# Patient Record
Sex: Female | Born: 1935 | Race: Black or African American | Hispanic: No | Smoking: Former smoker
Health system: Southern US, Community
[De-identification: ages and names within clinical notes are randomized; demographics above are authoritative.]

## PROBLEM LIST (undated history)

## (undated) ENCOUNTER — Emergency Department (HOSPITAL_COMMUNITY): Discharge status: Absent without leave | Payer: Commercial Managed Care - HMO | Source: Home / Self Care

## (undated) ENCOUNTER — Emergency Department (HOSPITAL_COMMUNITY): Payer: Commercial Managed Care - HMO | Source: Home / Self Care

## (undated) ENCOUNTER — Emergency Department (HOSPITAL_COMMUNITY): Admission: EM | Discharge status: Absent without leave | Payer: Self-pay

## (undated) DIAGNOSIS — I252 Old myocardial infarction: Secondary | ICD-10-CM

## (undated) DIAGNOSIS — I2510 Atherosclerotic heart disease of native coronary artery without angina pectoris: Secondary | ICD-10-CM

## (undated) DIAGNOSIS — I739 Peripheral vascular disease, unspecified: Principal | ICD-10-CM

## (undated) DIAGNOSIS — I779 Disorder of arteries and arterioles, unspecified: Secondary | ICD-10-CM

## (undated) DIAGNOSIS — I358 Other nonrheumatic aortic valve disorders: Secondary | ICD-10-CM

## (undated) DIAGNOSIS — I10 Essential (primary) hypertension: Secondary | ICD-10-CM

## (undated) DIAGNOSIS — I219 Acute myocardial infarction, unspecified: Secondary | ICD-10-CM

## (undated) DIAGNOSIS — E785 Hyperlipidemia, unspecified: Secondary | ICD-10-CM

## (undated) DIAGNOSIS — H409 Unspecified glaucoma: Secondary | ICD-10-CM

## (undated) DIAGNOSIS — K449 Diaphragmatic hernia without obstruction or gangrene: Secondary | ICD-10-CM

## (undated) HISTORY — DX: Peripheral vascular disease, unspecified: I73.9

## (undated) HISTORY — DX: Old myocardial infarction: I25.2

## (undated) HISTORY — DX: Disorder of arteries and arterioles, unspecified: I77.9

## (undated) HISTORY — DX: Hyperlipidemia, unspecified: E78.5

## (undated) HISTORY — DX: Atherosclerotic heart disease of native coronary artery without angina pectoris: I25.10

## (undated) HISTORY — PX: HIATAL HERNIA REPAIR: SHX195

## (undated) HISTORY — DX: Essential (primary) hypertension: I10

## (undated) HISTORY — DX: Unspecified glaucoma: H40.9

## (undated) HISTORY — PX: OTHER SURGICAL HISTORY: SHX169

## (undated) HISTORY — DX: Other nonrheumatic aortic valve disorders: I35.8

## (undated) HISTORY — PX: APPENDECTOMY: SHX54

## (undated) HISTORY — DX: Acute myocardial infarction, unspecified: I21.9

---

## 1998-04-18 ENCOUNTER — Ambulatory Visit (HOSPITAL_COMMUNITY): Admission: RE | Admit: 1998-04-18 | Discharge: 1998-04-18 | Payer: Self-pay | Admitting: Cardiology

## 2002-10-18 DIAGNOSIS — I358 Other nonrheumatic aortic valve disorders: Secondary | ICD-10-CM

## 2002-10-18 HISTORY — DX: Other nonrheumatic aortic valve disorders: I35.8

## 2002-10-18 HISTORY — PX: TRANSTHORACIC ECHOCARDIOGRAM: SHX275

## 2005-07-09 ENCOUNTER — Ambulatory Visit (HOSPITAL_COMMUNITY): Admission: RE | Admit: 2005-07-09 | Discharge: 2005-07-09 | Payer: Self-pay | Admitting: Gastroenterology

## 2011-11-16 ENCOUNTER — Other Ambulatory Visit: Payer: Self-pay | Admitting: Otolaryngology

## 2011-11-24 ENCOUNTER — Ambulatory Visit
Admission: RE | Admit: 2011-11-24 | Discharge: 2011-11-24 | Disposition: A | Discharge status: Home / Self Care | Payer: Medicare PPO | Source: Ambulatory Visit | Attending: Otolaryngology | Admitting: Otolaryngology

## 2011-11-24 MED ORDER — GADOBENATE DIMEGLUMINE 529 MG/ML IV SOLN
6.0000 mL | Freq: Once | INTRAVENOUS | Status: AC | PRN
  Administered 2011-11-24: 6 mL via INTRAVENOUS

## 2013-04-11 ENCOUNTER — Encounter: Payer: Self-pay | Admitting: Cardiology

## 2013-04-11 ENCOUNTER — Ambulatory Visit (INDEPENDENT_AMBULATORY_CARE_PROVIDER_SITE_OTHER): Payer: Medicare PPO | Admitting: Cardiology

## 2013-04-11 ENCOUNTER — Other Ambulatory Visit: Payer: Self-pay | Admitting: *Deleted

## 2013-04-11 VITALS — BP 168/62 | HR 69 | Ht 63.5 in | Wt 123.5 lb

## 2013-04-11 DIAGNOSIS — I359 Nonrheumatic aortic valve disorder, unspecified: Secondary | ICD-10-CM

## 2013-04-11 DIAGNOSIS — I779 Disorder of arteries and arterioles, unspecified: Secondary | ICD-10-CM

## 2013-04-11 DIAGNOSIS — E785 Hyperlipidemia, unspecified: Secondary | ICD-10-CM

## 2013-04-11 DIAGNOSIS — I358 Other nonrheumatic aortic valve disorders: Secondary | ICD-10-CM | POA: Insufficient documentation

## 2013-04-11 DIAGNOSIS — I10 Essential (primary) hypertension: Secondary | ICD-10-CM

## 2013-04-11 MED ORDER — LOVASTATIN 20 MG PO TABS: 20.0000 mg | ORAL_TABLET | Freq: Every day | ORAL | Status: DC

## 2013-04-11 MED ORDER — AMLODIPINE BESYLATE 10 MG PO TABS: 10.0000 mg | ORAL_TABLET | Freq: Every day | ORAL | Status: DC

## 2013-04-11 MED ORDER — CHLORTHALIDONE 25 MG PO TABS: 25.0000 mg | ORAL_TABLET | Freq: Every day | ORAL | Status: DC

## 2013-04-11 NOTE — Patient Instructions (Signed)
Your physician recommends that you continue on your current medications as directed. Please refer to the Current Medication list given to you today.  Only take Lovastatin once a day.   Your physician wants you to follow-up in 12 months with Dr Herbie Baltimore. You will receive a reminder letter in the mail two months in advance. If you don't receive a letter, please call our office to schedule the follow-up appointment.

## 2013-04-11 NOTE — Progress Notes (Signed)
Patient ID: Sheila Murphy, female   DOB: 09-30-1922, 79 y.o.   MRN: [id number]  Clinic Note: HPI: Sheila Murphy is a 79 y.o. female with a PMH below who presents today for routine followup of her carotid disease and hypertension. Interval History: she is a very pleasant 79 year old woman with a history of reportedly an MI back in the 1960s, as well as hypertension and dyslipidemia with bilateral carotid disease who is doing wonderfully from a cardiac standpoint. She is not symptomatic at all, no chest pain or shortness of breath with rest or exertion. No PND, orthopnea or edema. No lightheadedness, dizziness, wooziness, syncope or near syncope. No palpitations. No melena,  hematochezia or hematuria.  She denies any TIA or amaurosis fugax symptoms. She also denies any claudication symptoms.  Past Medical History  Diagnosis Date  . HTN (hypertension)   . Dyslipidemia     On Mevacor - followed by PCP  . Carotid artery disease without cerebral infarction     Right internal carotid: 70-90%, left 60-70%; asymptomatic, not and should follow.  . Aortic valve sclerosis 2004    Echo showed normal LV function, some mild diastolic dysfunction. Aortic sclerosis but no stenosis.    History reviewed. No pertinent past surgical history.   Allergies  Allergen Reactions  . Penicillins     Current Outpatient Prescriptions  Medication Sig Dispense Refill  . amLODipine (NORVASC) 10 MG tablet Take 10 mg by mouth daily.      . chlorthalidone (HYGROTON) 25 MG tablet Take 25 mg by mouth daily.      Marland Kitchen lovastatin (MEVACOR) 20 MG tablet Take 20 mg by mouth 2 (two) times daily.      . Multiple Vitamin (MULTIVITAMIN) tablet Take 1 tablet by mouth daily.      . naproxen sodium (ALEVE) 220 MG tablet Take 220 mg by mouth. 2 tablets in morning      . vitamin E 400 UNIT capsule Take 400 Units by mouth daily.       No current facility-administered medications for this visit.    History   Social History  .  Marital Status: Widowed    Spouse Name: N/A    Number of Children: N/A  . Years of Education: N/A   Occupational History  . Not on file.   Social History Main Topics  . Smoking status: Never Smoker   . Smokeless tobacco: Not on file  . Alcohol Use: No  . Drug Use: No  . Sexually Active: Not on file   Other Topics Concern  . Not on file   Social History Narrative   Widowed mother of 5 with one deceased child. She has 3 of her children the spend significant time with her 2 living in her home. 23 grandchildren 18 great grandchildren. 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 As of now the stairs of alcohol house without any problems. 2 sons that are her caregivers providing her meals and transportation.    ROS: A comprehensive Review of Systems - Negative except Mild arthritic pains, and just generally slowing down some. She is occasional leg cramping  PHYSICAL EXAM BP 168/62  Pulse 69  Ht 5' 3.5" (1.613 m)  Wt 123 lb 8 oz (56.019 kg)  BMI 21.53 kg/m2 General: She is a very pleasant, elderly woman. She is in no acute distress. Alert and oriented x3, answers questions appropriately. She is 5 feet, 4 inches, 125 pounds; stable sincelast visit,  and she says she is eating healthy. HEENT: Normocephalic, atraumatic. Extraocular muscles intact with the exception of some arcus senilis.  Neck is supple, no lymphadenopathy, thyromegaly or JVD. She has bilateral carotid bruits, left greater than the right, but normal carotid pulsation.  Lungs: Clear to auscultation bilaterally, normal respiratory effort, good air movement. Nonlabored.  Heart: Regular rate and rhythm, normal S1, S2 with a 1-2/6 SEM at heard at the right upper sternal border radiating to the carotids. Soft 1-2/6 HSM at the apex, nondisplaced PMI, no heaves or thrills.  Abdomen is soft, nontender, nondistended. Normoactive bowel sounds, no hepatosplenomegaly. Extremities: No  clubbing, cyanosis, or edema, 2+ and equal pulses throughout.  AVW:UJWJXBJYN today: Yes Rate: 69 , Rhythm: Normal sinus; normal ECG  Recent Labs:  Due to have checked in early July by PCP.  ASSESSMENT:  Very pleasant 79 year old woman, essentially stable from cardiac standpoint with no ongoing symptoms. She continues to decline any further evaluation of her carotid disease and her aortic valve disease.  Carotid artery disease without cerebral infarction  HTN (hypertension)  Dyslipidemia  Aortic valve sclerosis  PLAN: Per problem list. No orders of the defined types were placed in this encounter.   Meds refilled this encounter  Medications  . chlorthalidone (HYGROTON) 25 MG tablet    Sig: Take 25 mg by mouth daily.  Marland Kitchen lovastatin (MEVACOR) 20 MG tablet    Sig: Take 20 mg by mouth 2 (two) times daily.  Marland Kitchen amLODipine (NORVASC) 10 MG tablet    Sig: Take 10 mg by mouth daily.    Followup: 12 months  Sheila Murphy W, M.D., M.S. [facility] [address] [place], [place]  [address]  [phone] Pager # [phone] 04/11/2013 10:09 AM

## 2013-04-11 NOTE — Assessment & Plan Note (Signed)
I am dosing so she'll not going to be Mevacor once daily. She is due to have labs checked by her primary in July. Hopefully backing off the Mevacor may help summer cramps.

## 2013-04-11 NOTE — Assessment & Plan Note (Signed)
Well-controlled on current medications. She needs close monitoring of her chemistry panel as per her primary physician. I will then refill her medications medications today.

## 2013-04-11 NOTE — Assessment & Plan Note (Signed)
No ongoing symptoms. She defers any further evaluation unless she has symptoms.

## 2013-04-11 NOTE — Assessment & Plan Note (Signed)
No evidence of stenosis by her echocardiogram he is go. Does not sound be significantly worse than last year., She has symptoms concerning for air stenosis, she would prefer to not have any other evaluations done.

## 2014-02-04 ENCOUNTER — Other Ambulatory Visit (HOSPITAL_COMMUNITY): Payer: Self-pay | Admitting: Internal Medicine

## 2014-02-04 DIAGNOSIS — I6529 Occlusion and stenosis of unspecified carotid artery: Secondary | ICD-10-CM

## 2014-02-14 ENCOUNTER — Ambulatory Visit (HOSPITAL_COMMUNITY)
Admission: RE | Admit: 2014-02-14 | Discharge: 2014-02-14 | Disposition: A | Discharge status: Home / Self Care | Payer: Medicare PPO | Source: Ambulatory Visit | Attending: Cardiovascular Disease | Admitting: Cardiovascular Disease

## 2014-02-14 DIAGNOSIS — I779 Disorder of arteries and arterioles, unspecified: Secondary | ICD-10-CM

## 2014-02-14 DIAGNOSIS — I6529 Occlusion and stenosis of unspecified carotid artery: Secondary | ICD-10-CM | POA: Insufficient documentation

## 2014-02-14 NOTE — Progress Notes (Addendum)
Carotid Duplex Completed.Right ICA showed a known 70-99% stenosis. Marilynne Halstedita Cait Locust, BS, RDMS, RVT

## 2014-02-15 ENCOUNTER — Telehealth (HOSPITAL_COMMUNITY): Payer: Self-pay | Admitting: *Deleted

## 2014-03-29 ENCOUNTER — Other Ambulatory Visit: Payer: Self-pay | Admitting: *Deleted

## 2014-03-29 MED ORDER — LOVASTATIN 20 MG PO TABS: 20.0000 mg | ORAL_TABLET | Freq: Every day | ORAL | Status: DC

## 2014-03-29 NOTE — Telephone Encounter (Signed)
Rx refill sent to patient pharmacy   

## 2014-04-09 ENCOUNTER — Encounter: Payer: Self-pay | Admitting: *Deleted

## 2014-04-09 ENCOUNTER — Encounter: Payer: Self-pay | Admitting: Cardiology

## 2014-04-10 ENCOUNTER — Ambulatory Visit (INDEPENDENT_AMBULATORY_CARE_PROVIDER_SITE_OTHER): Payer: Medicare PPO | Admitting: Cardiology

## 2014-04-10 VITALS — BP 138/54 | HR 71 | Ht 60.0 in | Wt 123.7 lb

## 2014-04-10 DIAGNOSIS — I359 Nonrheumatic aortic valve disorder, unspecified: Secondary | ICD-10-CM

## 2014-04-10 DIAGNOSIS — I739 Peripheral vascular disease, unspecified: Principal | ICD-10-CM

## 2014-04-10 DIAGNOSIS — I779 Disorder of arteries and arterioles, unspecified: Secondary | ICD-10-CM

## 2014-04-10 DIAGNOSIS — I358 Other nonrheumatic aortic valve disorders: Secondary | ICD-10-CM

## 2014-04-10 DIAGNOSIS — I10 Essential (primary) hypertension: Secondary | ICD-10-CM

## 2014-04-10 DIAGNOSIS — E785 Hyperlipidemia, unspecified: Secondary | ICD-10-CM

## 2014-04-10 NOTE — Patient Instructions (Signed)
Continue with current  Medication.  Your physician wants you to follow-up in 12 months Dr Herbie BaltimoreHarding.  You will receive a reminder letter in the mail two months in advance. If you don't receive a letter, please call our office to schedule the follow-up appointment.

## 2014-04-14 ENCOUNTER — Encounter: Payer: Self-pay | Admitting: Cardiology

## 2014-04-14 NOTE — Assessment & Plan Note (Signed)
I was not aware that she had follow-up carotid dopplers.  These did show progression of disease, but she remains asymptomatic.  Based upon our previous discussions, she would defer treatment in the absence of symptoms.   Unfortunately, the results were not forwarded to me & I am not sure if she is aware of the results.   We will contact her to make sure that she is aware of the findings & confirm or deny her desire to forgo further evaluation.

## 2014-04-14 NOTE — Progress Notes (Signed)
Patient ID: Sheila Murphy, female   DOB: Nov 08, 1921, 80 y.o.   MRN: [id number]  Clinic Note: HPI: Sheila Murphy is a 80 y.o. female with a PMH below who presents today for routine followup of her carotid disease and hypertension.  Her last carotid ultrasound was in April of 2015 -- RproICA 70-99%, LICA 50-69% -- represents moderate progression of disease in RICA.  Interval History: She is a very pleasant 80 year old woman with a history of reportedly an MI back in the 1960s, as well as hypertension and dyslipidemia with bilateral carotid disease who is doing wonderfully from a cardiac standpoint. She is not symptomatic at all, no chest pain or shortness of breath with rest or exertion. No PND, orthopnea or edema. No lightheadedness, dizziness, wooziness, syncope or near syncope. No palpitations. No melena,  hematochezia or hematuria.  She denies any TIA or amaurosis fugax symptoms. She also denies any claudication symptoms.  She has 3 sons that live with her, and states that she mostly walks around inside the house unless one of them is at home because she is scared to go out of the house alone.  She walks slowly due to her arthritis and slightly unsteady gait, but does not get SOB or note CP.    Past Medical History  Diagnosis Date  . HTN (hypertension)   . Dyslipidemia     On Mevacor - followed by PCP  . Carotid artery disease without cerebral infarction     Right internal carotid: 70-90%, left 60-70%; asymptomatic --> moderately increased RICA velocities on 01/2014 dopplers.  . Aortic valve sclerosis 2004    Echo showed normal LV function, some mild diastolic dysfunction. Aortic sclerosis but no stenosis.  . Glaucoma   . History of heart attack 1960s    No WMA on Echo    Past Cardiac Evaluatoin / Procedure History  Procedure Laterality Date  . Transthoracic echocardiogram  2004    LV EF normal, LA mildly dilated, arial septum is aneurysmal, mild aortic sclerosis, mild mitral annular  calcification, mild MR, mild TR, RVSP 40-2050mmHg (mod pulm HTN)  . Carotid doppler  2011; 2015    RICA 70-99% diameter reduction, LICA 50-69% diameter reduction, right RCA significantly elevated velocities;; moderately increased when compared to 2011.     Allergies  Allergen Reactions  . Penicillins     Current Outpatient Prescriptions  Medication Sig Dispense Refill  . alendronate (FOSAMAX) 70 MG tablet Take 70 mg by mouth once a week.      Marland Kitchen. amLODipine (NORVASC) 10 MG tablet Take 1 tablet (10 mg total) by mouth daily.  30 tablet  11  . aspirin 81 MG EC tablet Take 81 mg by mouth daily.      Marland Kitchen. CALCIUM PO Take by mouth.      . chlorthalidone (HYGROTON) 25 MG tablet Take 1 tablet (25 mg total) by mouth daily.  30 tablet  11  . cholecalciferol (VITAMIN D) 1000 UNITS tablet Take 1,000 Units by mouth daily.      Marland Kitchen. lovastatin (MEVACOR) 20 MG tablet Take 1 tablet (20 mg total) by mouth at bedtime.  30 tablet  0  . Multiple Vitamin (MULTIVITAMIN) tablet Take 1 tablet by mouth daily.      . naproxen sodium (ALEVE) 220 MG tablet Take 220 mg by mouth. 2 tablets in morning      . vitamin E 400 UNIT capsule Take 400 Units by mouth daily.       No current facility-administered  medications for this visit.   No Change to Social & Family History - [place].   ROS: A comprehensive Review of Systems - Negative except Mild arthritic pains, and just generally slowing down some. She is occasional leg cramping & knee/hip OA pains.  She is a bit more anxious & fearful of going places.  PHYSICAL EXAM BP 138/54  Pulse 71  Ht 5' (1.524 m)  Wt 123 lb 11.2 oz (56.11 kg)  BMI 24.16 kg/m2 General: She is a very pleasant, elderly woman. NAD, A&O x3 with clear memory.  Answers ?s appropriately. She appears to be well nourished & says she is eating healthy. HEENT: Normocephalic, atraumatic. EOMI, with the exception of some arcus senilis.  Neck is supple, no lymphadenopathy, thyromegaly or JVD. She has  bilateral carotid bruits, R >L, but normal carotid pulsation.  Lungs: CTAB, normal respiratory effort,good air movement. No W/R/R.  Heart: RRR, normal S1, S2 with a 2/6 SEM at heard at the right upper sternal border radiating to the carotids. Soft 1-2/6 HSM at the apex, nondisplaced PMI, no heaves or thrills.  Abdomen is soft, nontender, nondistended. Normoactive bowel sounds, no hepatosplenomegaly. Extremities: No clubbing, cyanosis, or edema, 2+ and equal pulses throughout. Neuro: Grossly intact.  WJX:[id number]:Performed today: Yes Rate: 6719 , Rhythm: Normal sinus; non-specific ST-T wave changes.  Recent Labs:  None available..  ASSESSMENT/PLAN:  Very pleasant 80 year old woman, essentially stable from cardiac standpoint with no ongoing symptoms. She continues to decline any further evaluation of her carotid disease and her aortic valve disease -- however came in for f/u dopplers.  She has not seemed inclined to pursue invasive treatment options.  Carotid artery disease without cerebral infarction I was not aware that she had follow-up carotid dopplers.  These did show progression of disease, but she remains asymptomatic.  Based upon our previous discussions, she would defer treatment in the absence of symptoms.   Unfortunately, the results were not forwarded to me & I am not sure if she is aware of the results.   We will contact her to make sure that she is aware of the findings & confirm or deny her desire to forgo further evaluation.   Aortic valve sclerosis She has not had a repeat Echo since 2004, and probably does not need one unless she begins to have symptoms of AS.  Mild Sclerosis at age ~79-81 is not likely to progress rapidly to any significant level of AS.  Essential hypertension Well controlled.  Continue current medications.   Would not be more aggressive in order to avoid orthostatic hypotension.  Need to carefully monitor labs.   Dyslipidemia Back on lower dose of lovastatin --  labs are supposedly monitored by her PCP    Orders Placed This Encounter  Procedures  . EKG 12-Lead     Followup: 12 months; will need telephonic discussion to ensure that she knows the results of her Carotid Dopplers.   Marykay LexHARDING,DAVID W, M.D., M.S. Interventional Cardiologist   Pager # [phone] 04/14/2014

## 2014-04-14 NOTE — Assessment & Plan Note (Signed)
Well controlled.  Continue current medications.   Would not be more aggressive in order to avoid orthostatic hypotension.  Need to carefully monitor labs.

## 2014-04-14 NOTE — Assessment & Plan Note (Signed)
Back on lower dose of lovastatin -- labs are supposedly monitored by her PCP

## 2014-04-14 NOTE — Assessment & Plan Note (Signed)
She has not had a repeat Echo since 2004, and probably does not need one unless she begins to have symptoms of AS.  Mild Sclerosis at age ~79-81 is not likely to progress rapidly to any significant level of AS.

## 2014-04-15 ENCOUNTER — Other Ambulatory Visit: Payer: Self-pay | Admitting: Cardiology

## 2014-04-15 NOTE — Telephone Encounter (Signed)
Rx refill sent to patient pharmacy   

## 2014-04-26 ENCOUNTER — Other Ambulatory Visit: Payer: Self-pay | Admitting: Cardiology

## 2014-04-26 NOTE — Telephone Encounter (Signed)
Rx was sent to pharmacy electronically. 

## 2014-08-19 ENCOUNTER — Other Ambulatory Visit: Payer: Self-pay | Admitting: Cardiology

## 2014-08-19 NOTE — Telephone Encounter (Signed)
Rx was sent to pharmacy electronically. 

## 2014-12-04 DIAGNOSIS — E782 Mixed hyperlipidemia: Secondary | ICD-10-CM | POA: Diagnosis not present

## 2014-12-04 DIAGNOSIS — I129 Hypertensive chronic kidney disease with stage 1 through stage 4 chronic kidney disease, or unspecified chronic kidney disease: Secondary | ICD-10-CM | POA: Diagnosis not present

## 2014-12-04 DIAGNOSIS — E039 Hypothyroidism, unspecified: Secondary | ICD-10-CM | POA: Diagnosis not present

## 2014-12-04 DIAGNOSIS — R7309 Other abnormal glucose: Secondary | ICD-10-CM | POA: Diagnosis not present

## 2014-12-04 DIAGNOSIS — N183 Chronic kidney disease, stage 3 (moderate): Secondary | ICD-10-CM | POA: Diagnosis not present

## 2014-12-04 DIAGNOSIS — R634 Abnormal weight loss: Secondary | ICD-10-CM | POA: Diagnosis not present

## 2014-12-05 ENCOUNTER — Inpatient Hospital Stay (HOSPITAL_COMMUNITY)
Admission: EM | Admit: 2014-12-05 | Discharge: 2014-12-16 | DRG: 356 | Disposition: A | Discharge status: Home Health | Payer: Commercial Managed Care - HMO | Attending: Internal Medicine | Admitting: Internal Medicine

## 2014-12-05 ENCOUNTER — Encounter (HOSPITAL_COMMUNITY): Payer: Self-pay | Admitting: General Practice

## 2014-12-05 DIAGNOSIS — I10 Essential (primary) hypertension: Secondary | ICD-10-CM | POA: Diagnosis not present

## 2014-12-05 DIAGNOSIS — Z8249 Family history of ischemic heart disease and other diseases of the circulatory system: Secondary | ICD-10-CM | POA: Diagnosis not present

## 2014-12-05 DIAGNOSIS — E785 Hyperlipidemia, unspecified: Secondary | ICD-10-CM | POA: Diagnosis present

## 2014-12-05 DIAGNOSIS — E872 Acidosis: Secondary | ICD-10-CM | POA: Diagnosis not present

## 2014-12-05 DIAGNOSIS — D696 Thrombocytopenia, unspecified: Secondary | ICD-10-CM | POA: Diagnosis not present

## 2014-12-05 DIAGNOSIS — E039 Hypothyroidism, unspecified: Secondary | ICD-10-CM | POA: Diagnosis not present

## 2014-12-05 DIAGNOSIS — R579 Shock, unspecified: Secondary | ICD-10-CM | POA: Diagnosis not present

## 2014-12-05 DIAGNOSIS — N390 Urinary tract infection, site not specified: Secondary | ICD-10-CM | POA: Diagnosis not present

## 2014-12-05 DIAGNOSIS — K254 Chronic or unspecified gastric ulcer with hemorrhage: Secondary | ICD-10-CM | POA: Diagnosis not present

## 2014-12-05 DIAGNOSIS — I358 Other nonrheumatic aortic valve disorders: Secondary | ICD-10-CM | POA: Diagnosis present

## 2014-12-05 DIAGNOSIS — K922 Gastrointestinal hemorrhage, unspecified: Secondary | ICD-10-CM | POA: Diagnosis not present

## 2014-12-05 DIAGNOSIS — Z79899 Other long term (current) drug therapy: Secondary | ICD-10-CM | POA: Diagnosis not present

## 2014-12-05 DIAGNOSIS — N170 Acute kidney failure with tubular necrosis: Secondary | ICD-10-CM | POA: Diagnosis not present

## 2014-12-05 DIAGNOSIS — R404 Transient alteration of awareness: Secondary | ICD-10-CM | POA: Diagnosis not present

## 2014-12-05 DIAGNOSIS — R578 Other shock: Secondary | ICD-10-CM | POA: Diagnosis not present

## 2014-12-05 DIAGNOSIS — K439 Ventral hernia without obstruction or gangrene: Secondary | ICD-10-CM | POA: Diagnosis present

## 2014-12-05 DIAGNOSIS — Z95828 Presence of other vascular implants and grafts: Secondary | ICD-10-CM

## 2014-12-05 DIAGNOSIS — E876 Hypokalemia: Secondary | ICD-10-CM | POA: Diagnosis not present

## 2014-12-05 DIAGNOSIS — I748 Embolism and thrombosis of other arteries: Secondary | ICD-10-CM | POA: Diagnosis not present

## 2014-12-05 DIAGNOSIS — R8290 Unspecified abnormal findings in urine: Secondary | ICD-10-CM | POA: Diagnosis not present

## 2014-12-05 DIAGNOSIS — I252 Old myocardial infarction: Secondary | ICD-10-CM

## 2014-12-05 DIAGNOSIS — I951 Orthostatic hypotension: Secondary | ICD-10-CM | POA: Diagnosis not present

## 2014-12-05 DIAGNOSIS — Z7982 Long term (current) use of aspirin: Secondary | ICD-10-CM | POA: Diagnosis not present

## 2014-12-05 DIAGNOSIS — S80212A Abrasion, left knee, initial encounter: Secondary | ICD-10-CM | POA: Diagnosis not present

## 2014-12-05 DIAGNOSIS — Z452 Encounter for adjustment and management of vascular access device: Secondary | ICD-10-CM | POA: Diagnosis not present

## 2014-12-05 DIAGNOSIS — D62 Acute posthemorrhagic anemia: Secondary | ICD-10-CM | POA: Diagnosis present

## 2014-12-05 DIAGNOSIS — K264 Chronic or unspecified duodenal ulcer with hemorrhage: Principal | ICD-10-CM | POA: Diagnosis present

## 2014-12-05 DIAGNOSIS — I2510 Atherosclerotic heart disease of native coronary artery without angina pectoris: Secondary | ICD-10-CM | POA: Diagnosis not present

## 2014-12-05 DIAGNOSIS — K274 Chronic or unspecified peptic ulcer, site unspecified, with hemorrhage: Secondary | ICD-10-CM | POA: Diagnosis not present

## 2014-12-05 DIAGNOSIS — I878 Other specified disorders of veins: Secondary | ICD-10-CM

## 2014-12-05 DIAGNOSIS — K921 Melena: Secondary | ICD-10-CM | POA: Diagnosis not present

## 2014-12-05 DIAGNOSIS — Z880 Allergy status to penicillin: Secondary | ICD-10-CM

## 2014-12-05 DIAGNOSIS — B9620 Unspecified Escherichia coli [E. coli] as the cause of diseases classified elsewhere: Secondary | ICD-10-CM | POA: Diagnosis present

## 2014-12-05 DIAGNOSIS — R933 Abnormal findings on diagnostic imaging of other parts of digestive tract: Secondary | ICD-10-CM | POA: Diagnosis not present

## 2014-12-05 DIAGNOSIS — D649 Anemia, unspecified: Secondary | ICD-10-CM | POA: Diagnosis not present

## 2014-12-05 DIAGNOSIS — G4720 Circadian rhythm sleep disorder, unspecified type: Secondary | ICD-10-CM | POA: Diagnosis not present

## 2014-12-05 DIAGNOSIS — K265 Chronic or unspecified duodenal ulcer with perforation: Secondary | ICD-10-CM | POA: Diagnosis not present

## 2014-12-05 DIAGNOSIS — K2950 Unspecified chronic gastritis without bleeding: Secondary | ICD-10-CM | POA: Diagnosis not present

## 2014-12-05 DIAGNOSIS — H409 Unspecified glaucoma: Secondary | ICD-10-CM | POA: Diagnosis present

## 2014-12-05 DIAGNOSIS — E43 Unspecified severe protein-calorie malnutrition: Secondary | ICD-10-CM | POA: Diagnosis not present

## 2014-12-05 DIAGNOSIS — J90 Pleural effusion, not elsewhere classified: Secondary | ICD-10-CM | POA: Diagnosis not present

## 2014-12-05 DIAGNOSIS — K260 Acute duodenal ulcer with hemorrhage: Secondary | ICD-10-CM | POA: Diagnosis not present

## 2014-12-05 DIAGNOSIS — R531 Weakness: Secondary | ICD-10-CM | POA: Diagnosis not present

## 2014-12-05 HISTORY — DX: Diaphragmatic hernia without obstruction or gangrene: K44.9

## 2014-12-05 LAB — COMPREHENSIVE METABOLIC PANEL
ALK PHOS: 43 U/L (ref 39–117)
ALT: 12 U/L (ref 0–35)
AST: 30 U/L (ref 0–37)
Albumin: 2.7 g/dL — ABNORMAL LOW (ref 3.5–5.2)
Anion gap: 14 (ref 5–15)
BILIRUBIN TOTAL: 0.9 mg/dL (ref 0.3–1.2)
BUN: 48 mg/dL — ABNORMAL HIGH (ref 6–23)
CO2: 24 mmol/L (ref 19–32)
Calcium: 9.8 mg/dL (ref 8.4–10.5)
Chloride: 100 mmol/L (ref 96–112)
Creatinine, Ser: 1.99 mg/dL — ABNORMAL HIGH (ref 0.50–1.10)
GFR calc Af Amer: 24 mL/min — ABNORMAL LOW (ref >90)
GFR calc non Af Amer: 21 mL/min — ABNORMAL LOW (ref >90)
GLUCOSE: 176 mg/dL — AB (ref 70–99)
Potassium: 3.3 mmol/L — ABNORMAL LOW (ref 3.5–5.1)
SODIUM: 138 mmol/L (ref 135–145)
Total Protein: 5.3 g/dL — ABNORMAL LOW (ref 6.0–8.3)

## 2014-12-05 LAB — URINALYSIS, ROUTINE W REFLEX MICROSCOPIC
Bilirubin Urine: NEGATIVE
GLUCOSE, UA: NEGATIVE mg/dL
Hgb urine dipstick: NEGATIVE
Ketones, ur: NEGATIVE mg/dL
NITRITE: NEGATIVE
PH: 5 (ref 5.0–8.0)
Protein, ur: 30 mg/dL — AB
SPECIFIC GRAVITY, URINE: 1.019 (ref 1.005–1.030)
Urobilinogen, UA: 1 mg/dL (ref 0.0–1.0)

## 2014-12-05 LAB — RETICULOCYTES
RBC.: 3.47 MIL/uL — AB (ref 3.87–5.11)
RETIC COUNT ABSOLUTE: 62.5 10*3/uL (ref 19.0–186.0)
RETIC CT PCT: 1.8 % (ref 0.4–3.1)

## 2014-12-05 LAB — CBC WITH DIFFERENTIAL/PLATELET
Basophils Absolute: 0 10*3/uL (ref 0.0–0.1)
Basophils Relative: 0 % (ref 0–1)
EOS ABS: 0 10*3/uL (ref 0.0–0.7)
Eosinophils Relative: 0 % (ref 0–5)
HEMATOCRIT: 19.7 % — AB (ref 36.0–46.0)
Hemoglobin: 6.7 g/dL — CL (ref 12.0–15.0)
LYMPHS ABS: 0.5 10*3/uL — AB (ref 0.7–4.0)
Lymphocytes Relative: 3 % — ABNORMAL LOW (ref 12–46)
MCH: 30.2 pg (ref 26.0–34.0)
MCHC: 34 g/dL (ref 30.0–36.0)
MCV: 88.7 fL (ref 78.0–100.0)
MONO ABS: 0.5 10*3/uL (ref 0.1–1.0)
Monocytes Relative: 4 % (ref 3–12)
NEUTROS PCT: 93 % — AB (ref 43–77)
Neutro Abs: 13.1 10*3/uL — ABNORMAL HIGH (ref 1.7–7.7)
Platelets: 242 10*3/uL (ref 150–400)
RBC: 2.22 MIL/uL — ABNORMAL LOW (ref 3.87–5.11)
RDW: 13.6 % (ref 11.5–15.5)
WBC: 14.1 10*3/uL — AB (ref 4.0–10.5)

## 2014-12-05 LAB — CBC
HEMATOCRIT: 29.9 % — AB (ref 36.0–46.0)
HEMOGLOBIN: 10.5 g/dL — AB (ref 12.0–15.0)
MCH: 30.3 pg (ref 26.0–34.0)
MCHC: 35.1 g/dL (ref 30.0–36.0)
MCV: 86.2 fL (ref 78.0–100.0)
PLATELETS: 177 10*3/uL (ref 150–400)
RBC: 3.47 MIL/uL — ABNORMAL LOW (ref 3.87–5.11)
RDW: 13.5 % (ref 11.5–15.5)
WBC: 19.3 10*3/uL — ABNORMAL HIGH (ref 4.0–10.5)

## 2014-12-05 LAB — ABO/RH: ABO/RH(D): O POS

## 2014-12-05 LAB — URINE MICROSCOPIC-ADD ON

## 2014-12-05 LAB — MRSA PCR SCREENING: MRSA by PCR: NEGATIVE

## 2014-12-05 LAB — PREPARE RBC (CROSSMATCH)

## 2014-12-05 LAB — POC OCCULT BLOOD, ED: FECAL OCCULT BLD: POSITIVE — AB

## 2014-12-05 MED ORDER — SODIUM CHLORIDE 0.9 % IJ SOLN
3.0000 mL | Freq: Two times a day (BID) | INTRAMUSCULAR | Status: DC
  Administered 2014-12-06 – 2014-12-14 (×8): 3 mL via INTRAVENOUS

## 2014-12-05 MED ORDER — SODIUM CHLORIDE 0.9 % IV SOLN
INTRAVENOUS | Status: DC
  Administered 2014-12-05: 75 mL/h via INTRAVENOUS

## 2014-12-05 MED ORDER — SODIUM CHLORIDE 0.9 % IV SOLN
8.0000 mg/h | INTRAVENOUS | Status: DC
  Administered 2014-12-05 – 2014-12-07 (×4): 8 mg/h via INTRAVENOUS
  Filled 2014-12-05 (×8): qty 80

## 2014-12-05 MED ORDER — SODIUM CHLORIDE 0.9 % IV SOLN
INTRAVENOUS | Status: DC
  Administered 2014-12-05 – 2014-12-09 (×3): via INTRAVENOUS

## 2014-12-05 MED ORDER — POTASSIUM CHLORIDE 10 MEQ/100ML IV SOLN
10.0000 meq | INTRAVENOUS | Status: AC
  Administered 2014-12-05 (×2): 10 meq via INTRAVENOUS
  Filled 2014-12-05 (×3): qty 100

## 2014-12-05 MED ORDER — LEVOTHYROXINE SODIUM 100 MCG IV SOLR
12.5000 ug | Freq: Every day | INTRAVENOUS | Status: DC
  Administered 2014-12-06: 12.5 ug via INTRAVENOUS
  Filled 2014-12-05: qty 5

## 2014-12-05 MED ORDER — SODIUM CHLORIDE 0.9 % IV SOLN
80.0000 mg | Freq: Once | INTRAVENOUS | Status: AC
  Administered 2014-12-05: 80 mg via INTRAVENOUS
  Filled 2014-12-05: qty 80

## 2014-12-05 MED ORDER — PANTOPRAZOLE SODIUM 40 MG IV SOLR: 40.0000 mg | Freq: Two times a day (BID) | INTRAVENOUS | Status: DC

## 2014-12-05 MED ORDER — SODIUM CHLORIDE 0.9 % IV SOLN
Freq: Once | INTRAVENOUS | Status: AC
  Administered 2014-12-05: 12:00:00 via INTRAVENOUS

## 2014-12-05 MED ORDER — MORPHINE SULFATE 2 MG/ML IJ SOLN
1.0000 mg | INTRAMUSCULAR | Status: DC | PRN
  Administered 2014-12-15 (×2): 1 mg via INTRAVENOUS
  Filled 2014-12-05 (×2): qty 1

## 2014-12-05 MED ORDER — POTASSIUM CHLORIDE 10 MEQ/100ML IV SOLN: 10.0000 meq | INTRAVENOUS | Status: DC

## 2014-12-05 MED ORDER — DEXTROSE 5 % IV SOLN
1.0000 g | INTRAVENOUS | Status: DC
  Administered 2014-12-05 – 2014-12-07 (×2): 1 g via INTRAVENOUS
  Filled 2014-12-05 (×4): qty 10

## 2014-12-05 NOTE — H&P (Signed)
[facility] History and Physical                                                                                    Sheila Murphy, is a 81 y.o. female  MRN: [id number]   DOB - Jun 27, 1922  Admit Date - 12/05/2014  Outpatient Primary MD for the patient is Alva Garnet., MD  With History of -  Past Medical History  Diagnosis Date  . HTN (hypertension)   . Dyslipidemia     On Mevacor - followed by PCP  . Carotid artery disease without cerebral infarction     Right internal carotid: 70-90%, left 60-70%; asymptomatic --> moderately increased RICA velocities on 01/2014 dopplers.  . Aortic valve sclerosis 2004    Echo showed normal LV function, some mild diastolic dysfunction. Aortic sclerosis but no stenosis.  . Glaucoma   . History of heart attack 1960s    No WMA on Echo      Past Surgical History  Procedure Laterality Date  . Appendectomy    . Hiatal hernia repair    . Transthoracic echocardiogram  2004    LV EF normal, LA mildly dilated, arial septum is aneurysmal, mild aortic sclerosis, mild mitral annular calcification, mild MR, mild TR, RVSP 40-4mmHg (mod pulm HTN)  . Carotid doppler  2011; 2015    RICA 70-99% diameter reduction, LICA 50-69% diameter reduction, right RCA significantly elevated velocities;; moderately increased when compared to 2011.    in for   Chief Complaint  Patient presents with  . GI Bleeding     HPI [name]  is a 81 y.o. female, with past medical history of hypertension, hypothyroidism, dyslipidemia, known aortic valve sclerosis, asymptomatic right carotid artery disease, and osteoarthritis on regular NSAIDs. She was sent to the emergency room from the nursing home after having 2 bowel movements with a combination of blood and melena. The patient also reported that the night before after eating a sandwich she got nauseous and vomited and noted some red blood in the emesis. She also reported that while ambulating yesterday she became  dizzy and fell, no apparent injuries reported.  After arrival to the ER the patient was noted to be extremely pale in color. Although not hypotensive her blood pressure was quite soft for a patient with reported hypertension with a reading of 115/41. Her MAP was below 65 and her hemoglobin was very low at 6.7. Baseline hemoglobin unknown. Also had mild leukocytosis 14,100 with a left shift of 93%. Potassium was mildly decreased at 3.3 but her BUN was elevated at 48 with a creatinine of 1.99. Again baseline renal function unknown. Her urinalysis was also abnormal and appeared consistent with a likely urinary tract infection. The emergency room physician had ordered 2 units packed red blood cell for transfusion. She was fecal occult blood positive.  Review of Systems   In addition to the HPI above,  No Fever-chills, myalgias or other constitutional symptoms No Headache, changes with Vision or hearing, new weakness, tingling, numbness in any extremity, No problems swallowing food or Liquids, indigestion/reflux No Chest pain, Cough or Shortness of Breath, palpitations, orthopnea or DOE No Abdominal pain, Bowel  movements are regular No dysuria, hematuria or flank pain No new skin rashes, lesions, masses or bruises, No new joints pains-aches-takes Aleve 2 tabs every morning and 1 tab every evening at bedtime No recent weight gain or loss No polyuria, polydypsia or polyphagia,  *A full 10 point Review of Systems was done, except as stated above, all other Review of Systems were negative.  Social History History  Substance Use Topics  . Smoking status: Never Smoker   . Smokeless tobacco: Not on file  . Alcohol Use: No    Family History Family History  Problem Relation Age of Onset  . Other      Noncontributory, deferred due to age  . Heart attack Father     Prior to Admission medications   Medication Sig Start Date End Date Taking? Authorizing Provider  acetaminophen (TYLENOL) 650 MG CR  tablet Take 650 mg by mouth every 8 (eight) hours as needed for pain.   Yes Historical Provider, MD  alendronate (FOSAMAX) 70 MG tablet Take 70 mg by mouth once a week. Wednesday 04/05/14  Yes Historical Provider, MD  amLODipine (NORVASC) 5 MG tablet Take 5 mg by mouth daily.   Yes Historical Provider, MD  aspirin 81 MG EC tablet Take 81 mg by mouth daily. 03/26/14  Yes Historical Provider, MD  CALCIUM PO Take by mouth.   Yes Historical Provider, MD  chlorthalidone (HYGROTON) 25 MG tablet Take 25 mg by mouth daily.   Yes Historical Provider, MD  cholecalciferol (VITAMIN D) 1000 UNITS tablet Take 1,000 Units by mouth daily.   Yes Historical Provider, MD  levothyroxine (SYNTHROID, LEVOTHROID) 25 MCG tablet Take 25 mcg by mouth daily before breakfast.   Yes Historical Provider, MD  lovastatin (MEVACOR) 20 MG tablet Take 20 mg by mouth at bedtime.   Yes Historical Provider, MD  Multiple Vitamin (MULTIVITAMIN) tablet Take 1 tablet by mouth daily.   Yes Historical Provider, MD  naproxen sodium (ALEVE) 220 MG tablet Take 220 mg by mouth. 2 tablets in morning   Yes Historical Provider, MD  vitamin E 400 UNIT capsule Take 400 Units by mouth daily.   Yes Historical Provider, MD  amLODipine (NORVASC) 10 MG tablet Take 1 tablet (10 mg total) by mouth daily. Patient not taking: Reported on 12/05/2014 04/11/13   Marykay Lexavid W Harding, MD  chlorthalidone (HYGROTON) 25 MG tablet TAKE ONE TABLET BY MOUTH ONCE DAILY Patient not taking: Reported on 12/05/2014 08/19/14   Marykay Lexavid W Harding, MD  lovastatin (MEVACOR) 20 MG tablet TAKE ONE TABLET BY MOUTH AT BEDTIME Patient not taking: Reported on 12/05/2014 04/26/14   Marykay Lexavid W Harding, MD    Allergies  Allergen Reactions  . Penicillins     Physical Exam  Vitals  Blood pressure 111/30, pulse 60, MAP 58, temperature 97.9 F (36.6 C), temperature source Oral, resp. rate 16, height 5' (1.524 m), weight 121 lb (54.885 kg), SpO2 100 %.   General:  In no acute distress, appears  very pale  Psych:  Normal affect, Denies Suicidal or Homicidal ideations, Awake Alert, Oriented X 3. Speech and thought patterns are clear and appropriate, no apparent short term memory deficits  Neuro:   No focal neurological deficits, CN II through XII intact, Strength 5/5 all 4 extremities, Sensation intact all 4 extremities.  ENT:  Ears and Eyes appear Normal, Conjunctivae clear but pale, PER. Moist oral mucosa without erythema or exudates., Edentulous  Neck:  Supple, No lymphadenopathy appreciated  Respiratory:  Symmetrical chest wall movement,  Good air movement bilaterally, CTAB. Room Air  Cardiac:  RRR, No Murmurs, no LE edema noted, no JVD, No carotid bruits, peripheral pulses palpable at 2+  Abdomen:  Positive bowel sounds, Soft, Non tender, Non distended,  No masses appreciated, no obvious hepatosplenomegaly  Skin:  No Cyanosis, Normal Skin Turgor, No Skin Rash or Bruise. pale  Extremities: Symmetrical without obvious trauma or injury,  no effusions.  Data Review  CBC  Recent Labs Lab 12/05/14 0933  WBC 14.1*  HGB 6.7*  HCT 19.7*  PLT 242  MCV 88.7  MCH 30.2  MCHC 34.0  RDW 13.6  LYMPHSABS 0.5*  MONOABS 0.5  EOSABS 0.0  BASOSABS 0.0    Chemistries   Recent Labs Lab 12/05/14 0933  NA 138  K 3.3*  CL 100  CO2 24  GLUCOSE 176*  BUN 48*  CREATININE 1.99*  CALCIUM 9.8  AST 30  ALT 12  ALKPHOS 43  BILITOT 0.9    estimated creatinine clearance is 14.6 mL/min (by C-G formula based on Cr of 1.99).  No results for input(s): TSH, T4TOTAL, T3FREE, THYROIDAB in the last 72 hours.  Invalid input(s): FREET3  Coagulation profile No results for input(s): INR, PROTIME in the last 168 hours.  No results for input(s): DDIMER in the last 72 hours.  Cardiac Enzymes No results for input(s): CKMB, TROPONINI, MYOGLOBIN in the last 168 hours.  Invalid input(s): CK  Invalid input(s): POCBNP  Urinalysis    Component Value Date/Time   COLORURINE YELLOW  12/05/2014 0952   APPEARANCEUR HAZY* 12/05/2014 0952   LABSPEC 1.019 12/05/2014 0952   PHURINE 5.0 12/05/2014 0952   GLUCOSEU NEGATIVE 12/05/2014 0952   HGBUR NEGATIVE 12/05/2014 0952   BILIRUBINUR NEGATIVE 12/05/2014 0952   KETONESUR NEGATIVE 12/05/2014 0952   PROTEINUR 30* 12/05/2014 0952   UROBILINOGEN 1.0 12/05/2014 0952   NITRITE NEGATIVE 12/05/2014 0952   LEUKOCYTESUR SMALL* 12/05/2014 0952    Imaging results:   No results found.   Assessment & Plan  Principal Problem:   GIB  -Admit to stepdown -Suspect upper GI source given elevated BUN and recent regular use of Aleve (patient reports 2 tabs every morning and 1 tab every night at bedtime) -Also takes Fosamax -Begin Protonix infusion -Gastroenterology has been consulted -Nothing by mouth  Active Problems:   Acute blood loss anemia -Baseline hemoglobin unknown -Admission hemoglobin 6.7; 2 units packed red cells to be infused today -ck anemia panel -Repeat CBC after transfusion and again in a.m.-afterwards would recommend cycling CBC every 12 hours until assured bleeding has stopped    Essential hypertension -BP soft so holding home medications    Syncope due to orthostatic hypotension -Patient experienced a fall preceded by dizziness prior to admission on 2/17 -Suspect was due to symptomatic anemia and setting of GI bleed -Holding antihypertensives as above   Hypokalemia -IV replete after PRBCs infused    Abnormal urinalysis -Patient with urinalysis concerning for UTI, also associated leukocytosis -Obtain urine culture -Begin empiric Rocephin    Aortic valve sclerosis -No murmur auscultated on exam -Last echo in 2004 revealed normal LV function with mild diastolic dysfunction and no aortic stenosis    Hypothyroidism -Transition to oral Synthroid to IV until allowed diet    DVT Prophylaxis: SCDs  Family Communication:  Dr. Vanessa Barbara spoke with the patient's son at the bedside  Code Status:  Full code  confirmed by patient's son at bedside  Condition:  Guarded  Time spent in minutes : 60   ELLIS,ALLISON  L. ANP on 12/05/2014 at 11:53 AM  Between 7am to 7pm - Pager - [phone]  After 7pm go to [email or web address] - password [email or web address]  And look for the night coverage person covering me after hours  [facility]  Addendum  I personally evaluated patient on 12/05/2014 and agree with the above findings Patient is a pleasant 81 year old female currently resides at a nursing facility, who was brought to the emergency department by EMS after having 2 bowel movements with combination of blood and melena. Patient having a history of chronic NSAIDs use and had been taking 2 tablets of Aleve every morning and 1 tablet at bedtime. Initial lab work showing a hemoglobin of 6.7 with hematocrit of 19.7. Her most recent blood pressure was 137/32. On exam she appeared pale however was awake, alert, mentating well. She was typed and crossed as blood transfusion was initiated in the emergency department. Suspect source of GI bleed to be upper, likely related to peptic ulcer disease from chronic NSAIDs use. GI was consulted. Will start her on a Protonix drip, follow-up on H&H posttransfusion. IV fluids, supportive care, admission to stepdown unit. CODE STATUS discussed with her son who stated that she wouldn't want to undergo heroic efforts and cardiopulmonary resuscitation in the event of CODE BLUE.

## 2014-12-05 NOTE — ED Notes (Signed)
Pt brought in via EMS with complaints of a GI bleed that started this morning around 0100. Pt had an insignificant witnessed fall yesterday. Pt A/O. EMS vital signs 147/48 palpated, HR 66, RR 14, 98% R/A.

## 2014-12-05 NOTE — ED Notes (Addendum)
Phlebotomy called.  

## 2014-12-05 NOTE — Consult Note (Signed)
[facility] Reason for consult: G.I. bleeding Referring Physician: [facility]. PCP: Dr. Deirdre Priest is an 81 y.o. female.  HPI: she came into the emergency room with G.I. bleeding. She apparently woke up and had some bright blood with bowel movement. She had had some emesis the day before that was red after eating a sandwich but did not appear to be blood. She had no abdominal pain. She is a very poor historian and some of this information is obtained from one son who was with her today. She apparently has been constipated regularly and takes Ex-Lax. She is unable to tell me what her last bowel movement loss until yesterday and early this morning when she passed blood. She is currently pain-free. Hemoglobin 6.7 in the ER BUN 48 creatinine 2. She has a lot of arthritis pain and does take Aleve on regular basis but is unable to tell me how often she takes it. She has history of coronary disease and hypertension and heart attack over 50 years ago according to her.  Past Medical History  Diagnosis Date  . HTN (hypertension)   . Dyslipidemia     On Mevacor - followed by PCP  . Carotid artery disease without cerebral infarction     Right internal carotid: 70-90%, left 60-70%; asymptomatic --> moderately increased RICA velocities on 01/2014 dopplers.  . Aortic valve sclerosis 2004    Echo showed normal LV function, some mild diastolic dysfunction. Aortic sclerosis but no stenosis.  . Glaucoma   . History of heart attack 1960s    No WMA on Echo    Past Surgical History  Procedure Laterality Date  . Appendectomy    . Hiatal hernia repair    . Transthoracic echocardiogram  2004    LV EF normal, LA mildly dilated, arial septum is aneurysmal, mild aortic sclerosis, mild mitral annular calcification, mild MR, mild TR, RVSP 40-71mmHg (mod pulm HTN)  . Carotid doppler  2011; 2015    RICA 70-99% diameter reduction, LICA 10-62% diameter reduction, right RCA significantly  elevated velocities;; moderately increased when compared to 2011.    Family History  Problem Relation Age of Onset  . Other      Noncontributory, deferred due to age  . Heart attack Father     Social History:  reports that she has never smoked. She does not have any smokeless tobacco history on file. She reports that she does not drink alcohol or use illicit drugs.  Allergies:  Allergies  Allergen Reactions  . Penicillins     Medications; Prior to Admission medications   Medication Sig Start Date End Date Taking? Authorizing Provider  acetaminophen (TYLENOL) 650 MG CR tablet Take 650 mg by mouth every 8 (eight) hours as needed for pain.   Yes Historical Provider, MD  alendronate (FOSAMAX) 70 MG tablet Take 70 mg by mouth once a week. Wednesday 04/05/14  Yes Historical Provider, MD  amLODipine (NORVASC) 5 MG tablet Take 5 mg by mouth daily.   Yes Historical Provider, MD  aspirin 81 MG EC tablet Take 81 mg by mouth daily. 03/26/14  Yes Historical Provider, MD  CALCIUM PO Take by mouth.   Yes Historical Provider, MD  chlorthalidone (HYGROTON) 25 MG tablet Take 25 mg by mouth daily.   Yes Historical Provider, MD  cholecalciferol (VITAMIN D) 1000 UNITS tablet Take 1,000 Units by mouth daily.   Yes Historical Provider, MD  levothyroxine (SYNTHROID, LEVOTHROID) 25 MCG tablet Take 25 mcg by mouth daily  before breakfast.   Yes Historical Provider, MD  lovastatin (MEVACOR) 20 MG tablet Take 20 mg by mouth at bedtime.   Yes Historical Provider, MD  Multiple Vitamin (MULTIVITAMIN) tablet Take 1 tablet by mouth daily.   Yes Historical Provider, MD  naproxen sodium (ALEVE) 220 MG tablet Take 220 mg by mouth. 2 tablets in morning   Yes Historical Provider, MD  vitamin E 400 UNIT capsule Take 400 Units by mouth daily.   Yes Historical Provider, MD  amLODipine (NORVASC) 10 MG tablet Take 1 tablet (10 mg total) by mouth daily. Patient not taking: Reported on 12/05/2014 04/11/13   Leonie Man, MD   chlorthalidone (HYGROTON) 25 MG tablet TAKE ONE TABLET BY MOUTH ONCE DAILY Patient not taking: Reported on 12/05/2014 08/19/14   Leonie Man, MD  lovastatin (MEVACOR) 20 MG tablet TAKE ONE TABLET BY MOUTH AT BEDTIME Patient not taking: Reported on 12/05/2014 04/26/14   Leonie Man, MD   . Derrill Memo ON 12/08/2014] pantoprazole (PROTONIX) IV  40 mg Intravenous Q12H   PRN Meds  Results for orders placed or performed during the hospital encounter of 12/05/14 (from the past 48 hour(s))  POC occult blood, ED RN will collect     Status: Abnormal   Collection Time: 12/05/14  9:25 AM  Result Value Ref Range   Fecal Occult Bld POSITIVE (A) NEGATIVE  Type and screen     Status: None (Preliminary result)   Collection Time: 12/05/14  9:31 AM  Result Value Ref Range   ABO/RH(D) O POS    Antibody Screen NEG    Sample Expiration 12/08/2014    Unit Number [id number]    Blood Component Type RBC LR PHER1    Unit division 00    Status of Unit ISSUED    Transfusion Status OK TO TRANSFUSE    Crossmatch Result Compatible    Unit Number [id number]    Blood Component Type RED CELLS,LR    Unit division 00    Status of Unit ALLOCATED    Transfusion Status OK TO TRANSFUSE    Crossmatch Result Compatible   ABO/Rh     Status: None   Collection Time: 12/05/14  9:31 AM  Result Value Ref Range   ABO/RH(D) O POS   CBC with Differential     Status: Abnormal   Collection Time: 12/05/14  9:33 AM  Result Value Ref Range   WBC 14.1 (H) 4.0 - 10.5 K/uL   RBC 2.22 (L) 3.87 - 5.11 MIL/uL   Hemoglobin 6.7 (LL) 12.0 - 15.0 g/dL    Comment: REPEATED TO VERIFY CRITICAL RESULT CALLED TO, READ BACK BY AND VERIFIED WITH: RICE,D RN @ 1033 12/05/14 LEONARD,A    HCT 19.7 (L) 36.0 - 46.0 %   MCV 88.7 78.0 - 100.0 fL   MCH 30.2 26.0 - 34.0 pg   MCHC 34.0 30.0 - 36.0 g/dL   RDW 13.6 11.5 - 15.5 %   Platelets 242 150 - 400 K/uL   Neutrophils Relative % 93 (H) 43 - 77 %   Neutro Abs 13.1 (H) 1.7 - 7.7 K/uL    Lymphocytes Relative 3 (L) 12 - 46 %   Lymphs Abs 0.5 (L) 0.7 - 4.0 K/uL   Monocytes Relative 4 3 - 12 %   Monocytes Absolute 0.5 0.1 - 1.0 K/uL   Eosinophils Relative 0 0 - 5 %   Eosinophils Absolute 0.0 0.0 - 0.7 K/uL   Basophils Relative 0 0 - 1 %  Basophils Absolute 0.0 0.0 - 0.1 K/uL  Comprehensive metabolic panel     Status: Abnormal   Collection Time: 12/05/14  9:33 AM  Result Value Ref Range   Sodium 138 135 - 145 mmol/L   Potassium 3.3 (L) 3.5 - 5.1 mmol/L   Chloride 100 96 - 112 mmol/L   CO2 24 19 - 32 mmol/L   Glucose, Bld 176 (H) 70 - 99 mg/dL   BUN 48 (H) 6 - 23 mg/dL   Creatinine, Ser 1.99 (H) 0.50 - 1.10 mg/dL   Calcium 9.8 8.4 - 10.5 mg/dL   Total Protein 5.3 (L) 6.0 - 8.3 g/dL   Albumin 2.7 (L) 3.5 - 5.2 g/dL   AST 30 0 - 37 U/L   ALT 12 0 - 35 U/L   Alkaline Phosphatase 43 39 - 117 U/L   Total Bilirubin 0.9 0.3 - 1.2 mg/dL   GFR calc non Af Amer 21 (L) >90 mL/min   GFR calc Af Amer 24 (L) >90 mL/min    Comment: (NOTE) The eGFR has been calculated using the CKD EPI equation. This calculation has not been validated in all clinical situations. eGFR's persistently <90 mL/min signify possible Chronic Kidney Disease.    Anion gap 14 5 - 15  Urinalysis, Routine w reflex microscopic     Status: Abnormal   Collection Time: 12/05/14  9:52 AM  Result Value Ref Range   Color, Urine YELLOW YELLOW   APPearance HAZY (A) CLEAR   Specific Gravity, Urine 1.019 1.005 - 1.030   pH 5.0 5.0 - 8.0   Glucose, UA NEGATIVE NEGATIVE mg/dL   Hgb urine dipstick NEGATIVE NEGATIVE   Bilirubin Urine NEGATIVE NEGATIVE   Ketones, ur NEGATIVE NEGATIVE mg/dL   Protein, ur 30 (A) NEGATIVE mg/dL   Urobilinogen, UA 1.0 0.0 - 1.0 mg/dL   Nitrite NEGATIVE NEGATIVE   Leukocytes, UA SMALL (A) NEGATIVE  Urine microscopic-add on     Status: Abnormal   Collection Time: 12/05/14  9:52 AM  Result Value Ref Range   Squamous Epithelial / LPF FEW (A) RARE   WBC, UA 7-10 <3 WBC/hpf   Bacteria,  UA MANY (A) RARE  Prepare RBC     Status: None   Collection Time: 12/05/14 10:36 AM  Result Value Ref Range   Order Confirmation ORDER PROCESSED BY BLOOD BANK     No results found. ROS: was reviewed with changes is noted below Constitutional: 10 pound weight loss over the past several months son reports good appetite  GI: constipation denies heartburn indigestion             Blood pressure 100/86, pulse 64, temperature 97.9 F (36.6 C), temperature source Oral, resp. rate 13, height 5' (1.524 m), weight 54.885 kg (121 lb), SpO2 100 %.  Physical exam:   General--talkative African-American female with rambling speech ENT-- mucous membranes moist no evidence of blood and mouth Neck-- no lymphadenopathy Heart-- regular rate and rhythm without murmurs are gallops Lungs--clear Abdomen-- soft and nontender Psych-- rambling speech pleasantly confused   Assessment: 1. G.I. bleed. I suspect this is lower G.I. since it appears to be associated with constipation. She has been taking Aleve   Plan: 1. Agree with going ahead with transfusion and IV protonix. 2. We'll start her own clear liquids. Reevaluate in the morning and consider EGD if there are still signs of bleeding to rule out NSAID ulceration.   Jaaziel Peatross JR,Jamere Stidham L 12/05/2014, 1:26 PM

## 2014-12-05 NOTE — ED Notes (Signed)
[facility] at bedside. 

## 2014-12-05 NOTE — ED Provider Notes (Signed)
CSN: [id number]     Arrival date & time 12/05/14  4098 History   First MD Initiated Contact with Patient 12/05/14 0901     Chief Complaint  Patient presents with  . GI Bleeding     (Consider location/radiation/quality/duration/timing/severity/associated sxs/prior Treatment) The history is provided by the patient.   patient presents from nursing home with blood in her stool. Her probably had bowel movement with blood earlier today. Patient states she is only a little hungry. Denies lightheadedness or dizziness. No other bleeding. No headache. No confusion. She did have a mechanical fall yesterday where she landed on her left knees. No chest pain or abdominal pain. Patient appears pale  Past Medical History  Diagnosis Date  . HTN (hypertension)   . Dyslipidemia     On Mevacor - followed by PCP  . Carotid artery disease without cerebral infarction     Right internal carotid: 70-90%, left 60-70%; asymptomatic --> moderately increased RICA velocities on 01/2014 dopplers.  . Aortic valve sclerosis 2004    Echo showed normal LV function, some mild diastolic dysfunction. Aortic sclerosis but no stenosis.  . Glaucoma   . History of heart attack 1960s    No WMA on Echo   Past Surgical History  Procedure Laterality Date  . Appendectomy    . Hiatal hernia repair    . Transthoracic echocardiogram  2004    LV EF normal, LA mildly dilated, arial septum is aneurysmal, mild aortic sclerosis, mild mitral annular calcification, mild MR, mild TR, RVSP 40-75mmHg (mod pulm HTN)  . Carotid doppler  2011; 2015    RICA 70-99% diameter reduction, LICA 50-69% diameter reduction, right RCA significantly elevated velocities;; moderately increased when compared to 2011.   Family History  Problem Relation Age of Onset  . Other      Noncontributory, deferred due to age  . Heart attack Father    History  Substance Use Topics  . Smoking status: Never Smoker   . Smokeless tobacco: Not on file  . Alcohol  Use: No   OB History    No data available     Review of Systems  Constitutional: Negative for activity change and appetite change.  Eyes: Negative for pain.  Respiratory: Negative for chest tightness and shortness of breath.   Cardiovascular: Negative for chest pain and leg swelling.  Gastrointestinal: Positive for blood in stool. Negative for nausea, vomiting, abdominal pain and diarrhea.       Reportedly had black stool with blood.  Genitourinary: Negative for flank pain.  Musculoskeletal: Negative for back pain and neck stiffness.  Skin: Positive for pallor. Negative for rash.  Neurological: Negative for weakness and headaches.  Hematological: Does not bruise/bleed easily.  Psychiatric/Behavioral: Negative for behavioral problems.      Allergies  Penicillins  Home Medications   Prior to Admission medications   Medication Sig Start Date End Date Taking? Authorizing Provider  acetaminophen (TYLENOL) 650 MG CR tablet Take 650 mg by mouth every 8 (eight) hours as needed for pain.   Yes Historical Provider, MD  alendronate (FOSAMAX) 70 MG tablet Take 70 mg by mouth once a week. Wednesday 04/05/14  Yes Historical Provider, MD  amLODipine (NORVASC) 5 MG tablet Take 5 mg by mouth daily.   Yes Historical Provider, MD  aspirin 81 MG EC tablet Take 81 mg by mouth daily. 03/26/14  Yes Historical Provider, MD  CALCIUM PO Take by mouth.   Yes Historical Provider, MD  chlorthalidone (HYGROTON) 25 MG tablet Take  25 mg by mouth daily.   Yes Historical Provider, MD  cholecalciferol (VITAMIN D) 1000 UNITS tablet Take 1,000 Units by mouth daily.   Yes Historical Provider, MD  levothyroxine (SYNTHROID, LEVOTHROID) 25 MCG tablet Take 25 mcg by mouth daily before breakfast.   Yes Historical Provider, MD  lovastatin (MEVACOR) 20 MG tablet Take 20 mg by mouth at bedtime.   Yes Historical Provider, MD  Multiple Vitamin (MULTIVITAMIN) tablet Take 1 tablet by mouth daily.   Yes Historical Provider, MD   naproxen sodium (ALEVE) 220 MG tablet Take 220 mg by mouth. 2 tablets in morning   Yes Historical Provider, MD  vitamin E 400 UNIT capsule Take 400 Units by mouth daily.   Yes Historical Provider, MD  amLODipine (NORVASC) 10 MG tablet Take 1 tablet (10 mg total) by mouth daily. Patient not taking: Reported on 12/05/2014 04/11/13   Marykay Lex, MD  chlorthalidone (HYGROTON) 25 MG tablet TAKE ONE TABLET BY MOUTH ONCE DAILY Patient not taking: Reported on 12/05/2014 08/19/14   Marykay Lex, MD  lovastatin (MEVACOR) 20 MG tablet TAKE ONE TABLET BY MOUTH AT BEDTIME Patient not taking: Reported on 12/05/2014 04/26/14   Marykay Lex, MD   BP 155/36 mmHg  Pulse 66  Temp(Src) 98.6 F (37 C) (Oral)  Resp 13  Ht 5' (1.524 m)  Wt 121 lb (54.885 kg)  BMI 23.63 kg/m2  SpO2 100% Physical Exam  Constitutional: She appears well-developed.  HENT:  Head: Normocephalic.  Eyes: No scleral icterus.  Cardiovascular: Normal rate.   Pulmonary/Chest: Effort normal.  Abdominal: Soft. There is no tenderness.  Musculoskeletal: She exhibits no edema.  Mild abrasion to left knee.  Neurological: She is alert.  Skin: There is pallor.    ED Course  Procedures (including critical care time) Labs Review Labs Reviewed  CBC WITH DIFFERENTIAL/PLATELET - Abnormal; Notable for the following:    WBC 14.1 (*)    RBC 2.22 (*)    Hemoglobin 6.7 (*)    HCT 19.7 (*)    Neutrophils Relative % 93 (*)    Neutro Abs 13.1 (*)    Lymphocytes Relative 3 (*)    Lymphs Abs 0.5 (*)    All other components within normal limits  COMPREHENSIVE METABOLIC PANEL - Abnormal; Notable for the following:    Potassium 3.3 (*)    Glucose, Bld 176 (*)    BUN 48 (*)    Creatinine, Ser 1.99 (*)    Total Protein 5.3 (*)    Albumin 2.7 (*)    GFR calc non Af Amer 21 (*)    GFR calc Af Amer 24 (*)    All other components within normal limits  URINALYSIS, ROUTINE W REFLEX MICROSCOPIC - Abnormal; Notable for the following:     APPearance HAZY (*)    Protein, ur 30 (*)    Leukocytes, UA SMALL (*)    All other components within normal limits  URINE MICROSCOPIC-ADD ON - Abnormal; Notable for the following:    Squamous Epithelial / LPF FEW (*)    Bacteria, UA MANY (*)    All other components within normal limits  POC OCCULT BLOOD, ED - Abnormal; Notable for the following:    Fecal Occult Bld POSITIVE (*)    All other components within normal limits  URINE CULTURE  FERRITIN  FOLATE  IRON AND TIBC  RETICULOCYTES  VITAMIN B12  TYPE AND SCREEN  PREPARE RBC (CROSSMATCH)  ABO/RH    Imaging Review No  results found.   EKG Interpretation None      MDM   Final diagnoses:  Gastrointestinal hemorrhage, unspecified gastritis, unspecified gastrointestinal hemorrhage type  Acute blood loss anemia    Patient with GI bleeding. Hemoglobin of 6. Some active bleeding in the ER. Looks very pale but is not hypotensive. Transfuse 2 units in the ER admit to internal medicine. I also consulted with GI.  CRITICAL CARE Performed by: Billee CashingPICKERING,Ortha Metts R. Total critical care time: 30 Critical care time was exclusive of separately billable procedures and treating other patients. Critical care was necessary to treat or prevent imminent or life-threatening deterioration. Critical care was time spent personally by me on the following activities: development of treatment plan with patient and/or surrogate as well as nursing, discussions with consultants, evaluation of patient's response to treatment, examination of patient, obtaining history from patient or surrogate, ordering and performing treatments and interventions, ordering and review of laboratory studies, ordering and review of radiographic studies, pulse oximetry and re-evaluation of patient's condition.     Juliet RudeNathan R. Rubin PayorPickering, MD 12/05/14 [phone]

## 2014-12-05 NOTE — ED Notes (Signed)
Pt had small bloody stool. RN cleanup pt and changed pads.

## 2014-12-05 NOTE — ED Notes (Signed)
Pt had a moderate size bloody stool. RN changed pt and pads.

## 2014-12-05 NOTE — ED Notes (Signed)
Spoke with MD. MD clarified CBC order, CBC to be drawn after second unit is completed.

## 2014-12-05 NOTE — ED Notes (Signed)
CRITICAL VALUE ALERT  Critical value received:  Hemoglobin  Date of notification:  02/18  Time of notification:  1034  Critical value read back:Yes.    Nurse who received alert:  Gershon Musselreama Evangeline Utley  MD notified (1st page):  Dr. Rubin PayorPickering  Responding MD:  Dr. Rubin PayorPickering  Time MD responded:  [phone]

## 2014-12-05 NOTE — ED Notes (Signed)
Pt's son at bedside

## 2014-12-06 ENCOUNTER — Inpatient Hospital Stay (HOSPITAL_COMMUNITY): Payer: Commercial Managed Care - HMO | Admitting: Anesthesiology

## 2014-12-06 ENCOUNTER — Encounter (HOSPITAL_COMMUNITY)
Admission: EM | Disposition: A | Discharge status: Home Health | Payer: Self-pay | Source: Home / Self Care | Attending: Internal Medicine

## 2014-12-06 ENCOUNTER — Encounter (HOSPITAL_COMMUNITY): Payer: Self-pay | Admitting: *Deleted

## 2014-12-06 DIAGNOSIS — E43 Unspecified severe protein-calorie malnutrition: Secondary | ICD-10-CM | POA: Insufficient documentation

## 2014-12-06 DIAGNOSIS — R8290 Unspecified abnormal findings in urine: Secondary | ICD-10-CM

## 2014-12-06 HISTORY — PX: ESOPHAGOGASTRODUODENOSCOPY: SHX5428

## 2014-12-06 LAB — MAGNESIUM: MAGNESIUM: 1.9 mg/dL (ref 1.5–2.5)

## 2014-12-06 LAB — COMPREHENSIVE METABOLIC PANEL
ALBUMIN: 2.6 g/dL — AB (ref 3.5–5.2)
ALK PHOS: 40 U/L (ref 39–117)
ALT: 13 U/L (ref 0–35)
AST: 27 U/L (ref 0–37)
Anion gap: 8 (ref 5–15)
BILIRUBIN TOTAL: 1 mg/dL (ref 0.3–1.2)
BUN: 56 mg/dL — AB (ref 6–23)
CALCIUM: 8.7 mg/dL (ref 8.4–10.5)
CO2: 25 mmol/L (ref 19–32)
Chloride: 108 mmol/L (ref 96–112)
Creatinine, Ser: 1.6 mg/dL — ABNORMAL HIGH (ref 0.50–1.10)
GFR calc Af Amer: 32 mL/min — ABNORMAL LOW (ref >90)
GFR, EST NON AFRICAN AMERICAN: 27 mL/min — AB (ref >90)
Glucose, Bld: 85 mg/dL (ref 70–99)
POTASSIUM: 2.5 mmol/L — AB (ref 3.5–5.1)
Sodium: 141 mmol/L (ref 135–145)
Total Protein: 4.7 g/dL — ABNORMAL LOW (ref 6.0–8.3)

## 2014-12-06 LAB — CBC
HCT: 26.6 % — ABNORMAL LOW (ref 36.0–46.0)
Hemoglobin: 9.6 g/dL — ABNORMAL LOW (ref 12.0–15.0)
MCH: 30.6 pg (ref 26.0–34.0)
MCHC: 36.1 g/dL — AB (ref 30.0–36.0)
MCV: 84.7 fL (ref 78.0–100.0)
PLATELETS: 197 10*3/uL (ref 150–400)
RBC: 3.14 MIL/uL — ABNORMAL LOW (ref 3.87–5.11)
RDW: 14.3 % (ref 11.5–15.5)
WBC: 14.8 10*3/uL — ABNORMAL HIGH (ref 4.0–10.5)

## 2014-12-06 LAB — FERRITIN: Ferritin: 97 ng/mL (ref 10–291)

## 2014-12-06 LAB — IRON AND TIBC
Iron: 131 ug/dL (ref 42–145)
SATURATION RATIOS: 59 % — AB (ref 20–55)
TIBC: 222 ug/dL — ABNORMAL LOW (ref 250–470)
UIBC: 91 ug/dL — ABNORMAL LOW (ref 125–400)

## 2014-12-06 LAB — PROTIME-INR
INR: 1.32 (ref 0.00–1.49)
Prothrombin Time: 16.5 seconds — ABNORMAL HIGH (ref 11.6–15.2)

## 2014-12-06 LAB — CLOSTRIDIUM DIFFICILE BY PCR: Toxigenic C. Difficile by PCR: NEGATIVE

## 2014-12-06 LAB — FOLATE: Folate: >20 ng/mL

## 2014-12-06 LAB — VITAMIN B12: Vitamin B-12: 627 pg/mL (ref 211–911)

## 2014-12-06 LAB — APTT: aPTT: 27 seconds (ref 24–37)

## 2014-12-06 SURGERY — EGD (ESOPHAGOGASTRODUODENOSCOPY)
Anesthesia: Monitor Anesthesia Care

## 2014-12-06 MED ORDER — LACTATED RINGERS IV SOLN
INTRAVENOUS | Status: DC | PRN
  Administered 2014-12-06: 12:00:00 via INTRAVENOUS

## 2014-12-06 MED ORDER — AMLODIPINE BESYLATE 5 MG PO TABS
5.0000 mg | ORAL_TABLET | Freq: Every day | ORAL | Status: DC
  Administered 2014-12-06 – 2014-12-09 (×4): 5 mg via ORAL
  Filled 2014-12-06 (×4): qty 1

## 2014-12-06 MED ORDER — POTASSIUM CHLORIDE 10 MEQ/100ML IV SOLN
10.0000 meq | Freq: Once | INTRAVENOUS | Status: AC
  Administered 2014-12-06: 10 meq via INTRAVENOUS
  Filled 2014-12-06: qty 100

## 2014-12-06 MED ORDER — PANTOPRAZOLE SODIUM 40 MG PO TBEC: 40.0000 mg | DELAYED_RELEASE_TABLET | Freq: Every day | ORAL | Status: DC

## 2014-12-06 MED ORDER — FENTANYL CITRATE 0.05 MG/ML IJ SOLN
INTRAMUSCULAR | Status: DC | PRN
  Administered 2014-12-06: 50 ug via INTRAVENOUS

## 2014-12-06 MED ORDER — SODIUM CHLORIDE 0.9 % IV SOLN
INTRAVENOUS | Status: DC
Start: 2014-12-06 — End: 2014-12-06
  Administered 2014-12-06: 16:00:00 via INTRAVENOUS

## 2014-12-06 MED ORDER — POTASSIUM CHLORIDE CRYS ER 20 MEQ PO TBCR
40.0000 meq | EXTENDED_RELEASE_TABLET | Freq: Two times a day (BID) | ORAL | Status: AC
  Administered 2014-12-06 – 2014-12-07 (×3): 40 meq via ORAL
  Filled 2014-12-06 (×3): qty 2

## 2014-12-06 MED ORDER — LIDOCAINE HCL (CARDIAC) 20 MG/ML IV SOLN
INTRAVENOUS | Status: DC | PRN
  Administered 2014-12-06: 40 mg via INTRAVENOUS

## 2014-12-06 MED ORDER — POTASSIUM CHLORIDE 10 MEQ/100ML IV SOLN
10.0000 meq | INTRAVENOUS | Status: DC
  Administered 2014-12-06 (×4): 10 meq via INTRAVENOUS
  Filled 2014-12-06 (×5): qty 100

## 2014-12-06 MED ORDER — PROPOFOL 10 MG/ML IV BOLUS
INTRAVENOUS | Status: DC | PRN
  Administered 2014-12-06 (×3): 20 mg via INTRAVENOUS

## 2014-12-06 MED ORDER — LEVOTHYROXINE SODIUM 25 MCG PO TABS
25.0000 ug | ORAL_TABLET | Freq: Every day | ORAL | Status: DC
  Administered 2014-12-07 – 2014-12-09 (×3): 25 ug via ORAL
  Filled 2014-12-06 (×4): qty 1

## 2014-12-06 NOTE — Care Management Note (Addendum)
  Page 2 of 2   12/16/2014     10:23:28 AM CARE MANAGEMENT NOTE 12/16/2014  Patient:  Sheila Murphy,Sheila Murphy   Account Number:  [id number]  Date Initiated:  12/06/2014  Documentation initiated by:  MAYO,HENRIETTA  Subjective/Objective Assessment:   dx GI Bleed; lives with 2 sons    PCP  Andi DevonKimberly Shelton     Action/Plan:   Anticipated DC Date:  12/16/2014   Anticipated DC Plan:  HOME W HOME HEALTH SERVICES      DC Planning Services  CM consult      Choice offered to / List presented to:  C-4 Adult Children        HH arranged  HH-2 PT  HH-3 OT  HH-4 NURSE'S AIDE  HH-1 RN      [facility]   Status of service:  Completed, signed off Medicare Important Message given?  YES (If response is "NO", the following Medicare IM given date fields will be blank) Date Medicare IM given:  12/09/2014 Medicare IM given by:  MAYO,HENRIETTA Date Additional Medicare IM given:  12/16/2014 Additional Medicare IM given by:  Ronny FlurryHEATHER Xavyer Steenson  Discharge Disposition:    Per UR Regulation:  Reviewed for med. necessity/level of care/duration of stay  If discussed at Long Length of Stay Meetings, dates discussed:   12/10/2014    Comments:  Contact:  Spain,Phyllis  [phone]      Whilden,Dwight  [phone]     Brickel,Morris  [phone]      12/10/14 0738 Henrietta Mayo RN MSN BSN CCM Initial EGD revealed gastric and duodenal ulcers.  EGD 2/22: blood in stomach > IR for embolization; total of 8 units PRBCs transfused.  Son continues to want aggressive treatment.

## 2014-12-06 NOTE — H&P (View-Only) (Signed)
[facility] Reason for consult: G.I. bleeding Referring Physician: [facility]. PCP: Dr. Deirdre Murphy is an 81 y.o. female.  HPI: she came into the emergency room with G.I. bleeding. She apparently woke up and had some bright blood with bowel movement. She had had some emesis the day before that was red after eating a sandwich but did not appear to be blood. She had no abdominal pain. She is a very poor historian and some of this information is obtained from one son who was with her today. She apparently has been constipated regularly and takes Ex-Lax. She is unable to tell me what her last bowel movement loss until yesterday and early this morning when she passed blood. She is currently pain-free. Hemoglobin 6.7 in the ER BUN 48 creatinine 2. She has a lot of arthritis pain and does take Aleve on regular basis but is unable to tell me how often she takes it. She has history of coronary disease and hypertension and heart attack over 50 years ago according to her.  Past Medical History  Diagnosis Date  . HTN (hypertension)   . Dyslipidemia     On Mevacor - followed by PCP  . Carotid artery disease without cerebral infarction     Right internal carotid: 70-90%, left 60-70%; asymptomatic --> moderately increased RICA velocities on 01/2014 dopplers.  . Aortic valve sclerosis 2004    Echo showed normal LV function, some mild diastolic dysfunction. Aortic sclerosis but no stenosis.  . Glaucoma   . History of heart attack 1960s    No WMA on Echo    Past Surgical History  Procedure Laterality Date  . Appendectomy    . Hiatal hernia repair    . Transthoracic echocardiogram  2004    LV EF normal, LA mildly dilated, arial septum is aneurysmal, mild aortic sclerosis, mild mitral annular calcification, mild MR, mild TR, RVSP 40-62mmHg (mod pulm HTN)  . Carotid doppler  2011; 2015    RICA 70-99% diameter reduction, LICA 07-37% diameter reduction, right RCA significantly  elevated velocities;; moderately increased when compared to 2011.    Family History  Problem Relation Age of Onset  . Other      Noncontributory, deferred due to age  . Heart attack Father     Social History:  reports that she has never smoked. She does not have any smokeless tobacco history on file. She reports that she does not drink alcohol or use illicit drugs.  Allergies:  Allergies  Allergen Reactions  . Penicillins     Medications; Prior to Admission medications   Medication Sig Start Date End Date Taking? Authorizing Provider  acetaminophen (TYLENOL) 650 MG CR tablet Take 650 mg by mouth every 8 (eight) hours as needed for pain.   Yes Historical Provider, MD  alendronate (FOSAMAX) 70 MG tablet Take 70 mg by mouth once a week. Wednesday 04/05/14  Yes Historical Provider, MD  amLODipine (NORVASC) 5 MG tablet Take 5 mg by mouth daily.   Yes Historical Provider, MD  aspirin 81 MG EC tablet Take 81 mg by mouth daily. 03/26/14  Yes Historical Provider, MD  CALCIUM PO Take by mouth.   Yes Historical Provider, MD  chlorthalidone (HYGROTON) 25 MG tablet Take 25 mg by mouth daily.   Yes Historical Provider, MD  cholecalciferol (VITAMIN D) 1000 UNITS tablet Take 1,000 Units by mouth daily.   Yes Historical Provider, MD  levothyroxine (SYNTHROID, LEVOTHROID) 25 MCG tablet Take 25 mcg by mouth daily  before breakfast.   Yes Historical Provider, MD  lovastatin (MEVACOR) 20 MG tablet Take 20 mg by mouth at bedtime.   Yes Historical Provider, MD  Multiple Vitamin (MULTIVITAMIN) tablet Take 1 tablet by mouth daily.   Yes Historical Provider, MD  naproxen sodium (ALEVE) 220 MG tablet Take 220 mg by mouth. 2 tablets in morning   Yes Historical Provider, MD  vitamin E 400 UNIT capsule Take 400 Units by mouth daily.   Yes Historical Provider, MD  amLODipine (NORVASC) 10 MG tablet Take 1 tablet (10 mg total) by mouth daily. Patient not taking: Reported on 12/05/2014 04/11/13   Leonie Man, MD   chlorthalidone (HYGROTON) 25 MG tablet TAKE ONE TABLET BY MOUTH ONCE DAILY Patient not taking: Reported on 12/05/2014 08/19/14   Leonie Man, MD  lovastatin (MEVACOR) 20 MG tablet TAKE ONE TABLET BY MOUTH AT BEDTIME Patient not taking: Reported on 12/05/2014 04/26/14   Leonie Man, MD   . Derrill Memo ON 12/08/2014] pantoprazole (PROTONIX) IV  40 mg Intravenous Q12H   PRN Meds  Results for orders placed or performed during the hospital encounter of 12/05/14 (from the past 48 hour(s))  POC occult blood, ED RN will collect     Status: Abnormal   Collection Time: 12/05/14  9:25 AM  Result Value Ref Range   Fecal Occult Bld POSITIVE (A) NEGATIVE  Type and screen     Status: None (Preliminary result)   Collection Time: 12/05/14  9:31 AM  Result Value Ref Range   ABO/RH(D) O POS    Antibody Screen NEG    Sample Expiration 12/08/2014    Unit Number [id number]    Blood Component Type RBC LR PHER1    Unit division 00    Status of Unit ISSUED    Transfusion Status OK TO TRANSFUSE    Crossmatch Result Compatible    Unit Number [id number]    Blood Component Type RED CELLS,LR    Unit division 00    Status of Unit ALLOCATED    Transfusion Status OK TO TRANSFUSE    Crossmatch Result Compatible   ABO/Rh     Status: None   Collection Time: 12/05/14  9:31 AM  Result Value Ref Range   ABO/RH(D) O POS   CBC with Differential     Status: Abnormal   Collection Time: 12/05/14  9:33 AM  Result Value Ref Range   WBC 14.1 (H) 4.0 - 10.5 K/uL   RBC 2.22 (L) 3.87 - 5.11 MIL/uL   Hemoglobin 6.7 (LL) 12.0 - 15.0 g/dL    Comment: REPEATED TO VERIFY CRITICAL RESULT CALLED TO, READ BACK BY AND VERIFIED WITH: RICE,D RN @ 1033 12/05/14 LEONARD,A    HCT 19.7 (L) 36.0 - 46.0 %   MCV 88.7 78.0 - 100.0 fL   MCH 30.2 26.0 - 34.0 pg   MCHC 34.0 30.0 - 36.0 g/dL   RDW 13.6 11.5 - 15.5 %   Platelets 242 150 - 400 K/uL   Neutrophils Relative % 93 (H) 43 - 77 %   Neutro Abs 13.1 (H) 1.7 - 7.7 K/uL    Lymphocytes Relative 3 (L) 12 - 46 %   Lymphs Abs 0.5 (L) 0.7 - 4.0 K/uL   Monocytes Relative 4 3 - 12 %   Monocytes Absolute 0.5 0.1 - 1.0 K/uL   Eosinophils Relative 0 0 - 5 %   Eosinophils Absolute 0.0 0.0 - 0.7 K/uL   Basophils Relative 0 0 - 1 %  Basophils Absolute 0.0 0.0 - 0.1 K/uL  Comprehensive metabolic panel     Status: Abnormal   Collection Time: 12/05/14  9:33 AM  Result Value Ref Range   Sodium 138 135 - 145 mmol/L   Potassium 3.3 (L) 3.5 - 5.1 mmol/L   Chloride 100 96 - 112 mmol/L   CO2 24 19 - 32 mmol/L   Glucose, Bld 176 (H) 70 - 99 mg/dL   BUN 48 (H) 6 - 23 mg/dL   Creatinine, Ser 1.99 (H) 0.50 - 1.10 mg/dL   Calcium 9.8 8.4 - 10.5 mg/dL   Total Protein 5.3 (L) 6.0 - 8.3 g/dL   Albumin 2.7 (L) 3.5 - 5.2 g/dL   AST 30 0 - 37 U/L   ALT 12 0 - 35 U/L   Alkaline Phosphatase 43 39 - 117 U/L   Total Bilirubin 0.9 0.3 - 1.2 mg/dL   GFR calc non Af Amer 21 (L) >90 mL/min   GFR calc Af Amer 24 (L) >90 mL/min    Comment: (NOTE) The eGFR has been calculated using the CKD EPI equation. This calculation has not been validated in all clinical situations. eGFR's persistently <90 mL/min signify possible Chronic Kidney Disease.    Anion gap 14 5 - 15  Urinalysis, Routine w reflex microscopic     Status: Abnormal   Collection Time: 12/05/14  9:52 AM  Result Value Ref Range   Color, Urine YELLOW YELLOW   APPearance HAZY (A) CLEAR   Specific Gravity, Urine 1.019 1.005 - 1.030   pH 5.0 5.0 - 8.0   Glucose, UA NEGATIVE NEGATIVE mg/dL   Hgb urine dipstick NEGATIVE NEGATIVE   Bilirubin Urine NEGATIVE NEGATIVE   Ketones, ur NEGATIVE NEGATIVE mg/dL   Protein, ur 30 (A) NEGATIVE mg/dL   Urobilinogen, UA 1.0 0.0 - 1.0 mg/dL   Nitrite NEGATIVE NEGATIVE   Leukocytes, UA SMALL (A) NEGATIVE  Urine microscopic-add on     Status: Abnormal   Collection Time: 12/05/14  9:52 AM  Result Value Ref Range   Squamous Epithelial / LPF FEW (A) RARE   WBC, UA 7-10 <3 WBC/hpf   Bacteria,  UA MANY (A) RARE  Prepare RBC     Status: None   Collection Time: 12/05/14 10:36 AM  Result Value Ref Range   Order Confirmation ORDER PROCESSED BY BLOOD BANK     No results found. ROS: was reviewed with changes is noted below Constitutional: 10 pound weight loss over the past several months son reports good appetite  GI: constipation denies heartburn indigestion             Blood pressure 100/86, pulse 64, temperature 97.9 F (36.6 C), temperature source Oral, resp. rate 13, height 5' (1.524 m), weight 54.885 kg (121 lb), SpO2 100 %.  Physical exam:   General--talkative African-American female with rambling speech ENT-- mucous membranes moist no evidence of blood and mouth Neck-- no lymphadenopathy Heart-- regular rate and rhythm without murmurs are gallops Lungs--clear Abdomen-- soft and nontender Psych-- rambling speech pleasantly confused   Assessment: 1. G.I. bleed. I suspect this is lower G.I. since it appears to be associated with constipation. She has been taking Aleve   Plan: 1. Agree with going ahead with transfusion and IV protonix. 2. We'll start her own clear liquids. Reevaluate in the morning and consider EGD if there are still signs of bleeding to rule out NSAID ulceration.   Keslie Gritz JR,Shaylinn Hladik L 12/05/2014, 1:26 PM

## 2014-12-06 NOTE — Progress Notes (Signed)
CRITICAL VALUE ALERT  Critical value received:  Potassium 2.5  Date of notification:  12/06/14  Time of notification:  0608  Critical value read back: Yes   Nurse who received alert:  Julious OkaKarimah Abdussalaam RN    MD notified (1st page):  Craige CottaKirby NP  Time of first page:  0611  MD notified (2nd page):  Time of second page: 0630  Responding MD:  Craige CottaKirby NP   Time MD responded:  [phone]

## 2014-12-06 NOTE — Progress Notes (Signed)
[place] TEAM 1 - Stepdown/ICU TEAM Progress Note  Dorette GrateLavelle Cinelli KVQ:[id number] DOB: [birth year] DOA: 12/05/2014 PCP: Alva GarnetSHELTON,KIMBERLY R., MD  Admit HPI / Brief Narrative: 81 y.o. Female with history of hypertension, hypothyroidism, dyslipidemia, known aortic valve sclerosis, asymptomatic right carotid artery disease, and osteoarthritis on regular NSAIDs who was sent to the emergency room from the nursing home after having 2 bowel movements with a combination of blood and melena. The patient also reported that the night before after eating a sandwich she got nauseous and vomited and noted some red blood in the emesis. She also reported that while ambulating yesterday she became dizzy and fell, no apparent injuries reported.  After arrival to the ER the patient was noted to be extremely pale in color. Although not hypotensive her blood pressure was quite soft for a patient with reported hypertension with a reading of 115/41. Her MAP was below 65 and her hemoglobin was very low at 6.7. Baseline hemoglobin unknown. Her BUN was elevated at 48 with a creatinine of 1.99. Baseline renal function unknown. Her urinalysis was also abnormal and appeared consistent with a likely urinary tract infection. The emergency room physician ordered 2 units packed red blood cell for transfusion. She was fecal occult blood positive.  HPI/Subjective: Pt is in good spirits with no current complaints.  Denies cp, n/v, abdom pain, or sob.  She is hungry.    Assessment/Plan:  GIB  -gastric and duodenal ulcers noted via EGD - cont PPI - slowly advance diet at tolerated   Acute blood loss anemia -baseline hemoglobin unknown -admission hemoglobin 6.7 -s/p 2 units packed red cells - follow trend   Essential hypertension -BP has now recovered - resume norvasc and follow   Syncope due to orthostatic hypotension -Patient experienced a fall preceded by dizziness prior to admission on 2/17 -Suspect was due to symptomatic  anemia  -follow s/p transfusion   Hypokalemia -replace via IV and oral route - recheck in the AM - keep on tele - Mg normal   E coli UTI -cont empiric Rocephin - f/u sensitivities   Aortic valve sclerosis -No murmur auscultated on exam -Last echo in 2004 revealed normal LV function with mild diastolic dysfunction and no aortic stenosis  Hypothyroidism -cont Synthroid   Code Status: FULL Family Communication: no family present at time of exam Disposition Plan: SDU  Consultants: [facility]   Procedures: EGD - 2/19 - mucosa of the esophagus appeared normal - medium sized ulcer in the prepyloric region of the stomach - large ulcer in the duodenal bulb  Antibiotics: Rocephin 2/18 >  DVT prophylaxis: SCDs  Objective: Blood pressure 166/39, pulse 58, temperature 98.3 F (36.8 C), temperature source Oral, resp. rate 14, height 5' (1.524 m), weight 50.1 kg (110 lb 7.2 oz), SpO2 97 %.  Intake/Output Summary (Last 24 hours) at 12/06/14 1605 Last data filed at 12/06/14 1500  Gross per 24 hour  Intake   2163 ml  Output    700 ml  Net   1463 ml   Exam: General: No acute respiratory distress Lungs: Clear to auscultation bilaterally without wheezes or crackles Cardiovascular: Regular rate and rhythm without gallop or rub normal S1 and S2 Abdomen: Nontender, nondistended, soft, bowel sounds positive, no rebound, no ascites, no appreciable mass Extremities: No significant cyanosis, clubbing, or edema bilateral lower extremities  Data Reviewed: Basic Metabolic Panel:  Recent Labs Lab 12/05/14 0933 12/06/14 0415  NA 138 141  K 3.3* 2.5*  CL 100 108  CO2  24 25  GLUCOSE 176* 85  BUN 48* 56*  CREATININE 1.99* 1.60*  CALCIUM 9.8 8.7  MG  --  1.9    Liver Function Tests:  Recent Labs Lab 12/05/14 0933 12/06/14 0415  AST 30 27  ALT 12 13  ALKPHOS 43 40  BILITOT 0.9 1.0  PROT 5.3* 4.7*  ALBUMIN 2.7* 2.6*   Coags:  Recent Labs Lab 12/06/14 0415  INR 1.32     Recent Labs Lab 12/06/14 0415  APTT 27    CBC:  Recent Labs Lab 12/05/14 0933 12/05/14 1800 12/06/14 0415  WBC 14.1* 19.3* 14.8*  NEUTROABS 13.1*  --   --   HGB 6.7* 10.5* 9.6*  HCT 19.7* 29.9* 26.6*  MCV 88.7 86.2 84.7  PLT 242 177 197    Recent Results (from the past 240 hour(s))  Culture, Urine     Status: None (Preliminary result)   Collection Time: 12/05/14  9:50 AM  Result Value Ref Range Status   Specimen Description URINE, RANDOM  Final   Special Requests NONE  Final   Colony Count   Final    >=100,000 COLONIES/ML Performed at [facility]    Culture   Final    ESCHERICHIA COLI Performed at [facility]    Report Status PENDING  Incomplete  MRSA PCR Screening     Status: None   Collection Time: 12/05/14  8:15 PM  Result Value Ref Range Status   MRSA by PCR NEGATIVE NEGATIVE Final    Comment:        The GeneXpert MRSA Assay (FDA approved for NASAL specimens only), is one component of a comprehensive MRSA colonization surveillance program. It is not intended to diagnose MRSA infection nor to guide or monitor treatment for MRSA infections.   Clostridium Difficile by PCR     Status: None   Collection Time: 12/05/14  9:40 PM  Result Value Ref Range Status   C difficile by pcr NEGATIVE NEGATIVE Final     Studies:  Recent x-ray studies have been reviewed in detail by the Attending Physician  Scheduled Meds:  Scheduled Meds: . cefTRIAXone (ROCEPHIN)  IV  1 g Intravenous Q24H  . levothyroxine  12.5 mcg Intravenous Daily  . sodium chloride  3 mL Intravenous Q12H    Time spent on care of this patient: 35 mins   Arrionna Serena T , MD   [facility] Office  [phone] Pager - Text Page per Loretha Stapler as per below:  On-Call/Text Page:      [email or web address]      password [email or web address]  If 7PM-7AM, please contact night-coverage [email or web address] Password [email or web address] 12/06/2014, 4:05 PM   LOS: 1 day

## 2014-12-06 NOTE — Op Note (Signed)
[facility] [address] [place], [id number]   ENDOSCOPY PROCEDURE REPORT  PATIENT: Sheila Murphy, Sheila Murphy  MR#: [id number] BIRTHDATE: [birth year] , 89  yrs. old GENDER: female ENDOSCOPIST:Monae Topping Randa EvensEdwards, MD REFERRED BY: Kellie ShropshireKim Shelton, M.D. PROCEDURE DATE:  12/06/2014 PROCEDURE:   EGD w/ biopsy ASA CLASS:    Class III INDICATIONS: patient presented with anemia.  Has had some positive stools.  Has history of using Aleve. MEDICATION: Monitored anesthesia care TOPICAL ANESTHETIC:   Cetacaine Spray  DESCRIPTION OF PROCEDURE:   After the risks and benefits of the procedure were explained, informed consent was obtained.  The Pentax Gastroscope [id number]  endoscope was introduced through the mouth  and advanced to the second portion of the duodenum .  The instrument was slowly withdrawn as the mucosa was fully examined.      ESOPHAGUS: The mucosa of the esophagus appeared normal.  STOMACH: A medium sized non-bleeding ulcer was found in the prepyloric region of the stomach.  DUODENUM: A large non-bleeding ulcer was found in the duodenal bulb. Retroflexed views revealed no abnormalities.    The scope was then withdrawn from the patient and the procedure completed.  COMPLICATIONS: There were no immediate complications.  ENDOSCOPIC IMPRESSION: 1.   The mucosa of the esophagus appeared normal 2.   Medium sized ulcer was found in the prepyloric region of the stomach 3.   Large ulcer was found in the duodenal bulb RECOMMENDATIONS: Continue on PPI therapy probable switch to Oral tomorrow.  We'll start liquid diet   _______________________________ eSignedCarman Ching:  Damonie Furney, MD 12/06/2014 1:00 PM     cc: Kellie ShropshireKim Shelton, MD  CPT CODES: ICD CODES:  The ICD and CPT codes recommended by this software are interpretations from the data that the clinical staff has captured with the software.  The verification of the translation of this report to the ICD and CPT codes  and modifiers is the sole responsibility of the health care institution and practicing physician where this report was generated.  [employer] will not be held responsible for the validity of the ICD and CPT codes included on this report.  AMA assumes no liability for data contained or not contained herein. CPT is a Publishing rights managerregistered trademark of the [employer].  PATIENT NAME:  Sheila Murphy, Sheila Murphy MR#: [id number]

## 2014-12-06 NOTE — Anesthesia Postprocedure Evaluation (Signed)
Anesthesia Post Note  Patient: Sheila Murphy  Procedure(s) Performed: Procedure(s) (LRB): ESOPHAGOGASTRODUODENOSCOPY (EGD) (N/A)  Anesthesia type: general  Patient location: PACU  Post pain: Pain level controlled  Post assessment: Patient's Cardiovascular Status Stable  Last Vitals:  Filed Vitals:   12/06/14 1300  BP: 166/39  Pulse: 58  Temp:   Resp: 14    Post vital signs: Reviewed and stable  Level of consciousness: sedated  Complications: No apparent anesthesia complications

## 2014-12-06 NOTE — Progress Notes (Signed)
INITIAL NUTRITION ASSESSMENT  DOCUMENTATION CODES Per approved criteria  -Severe malnutrition in the context of acute illness or injury   Pt meets criteria for severe MALNUTRITION in the context of acute illness as evidenced by <50% estimated energy intake x 5 days, mild to moderate fat and muscle wasting.  INTERVENTION: -RD to follow for diet advancement -Supplement diet as appropriate  NUTRITION DIAGNOSIS: Inadequate oral intake related to altered GI function as evidenced by NPO.   Goal: Pt will meet >90% of estimated nutritional needs  Monitor:  Diet advancement, PO intake, labs, weight changes, I/O's  Reason for Assessment: MST=2  81 y.o. female  Admitting Dx: GIB (gastrointestinal bleeding)  Sheila Murphy is a 81 y.o. female, with past medical history of hypertension, hypothyroidism, dyslipidemia, known aortic valve sclerosis, asymptomatic right carotid artery disease, and osteoarthritis on regular NSAIDs. She was sent to the emergency room from the nursing home after having 2 bowel movements with a combination of blood and melena.   ASSESSMENT: Pt admitted with GIB. She is scheduled for EGD.  Hx obtained by pt at bedside. Pt confirms poor appetite x 1 week due to nausea and vomiting after eating [employer] on 11/28/14. Prior to this incident, she reports good baseline appetite with no difficulty chewing or swallowing foods.  She reports UBW of 123#. She reveals that she has lost "at least 2 pounds" since yesterday. UBW confirmed by wt hx.  Labs reviewed. K: 2.5 (on supplement), BUN/Creat: 56/1.60.   Nutrition Focused Physical Exam:  Subcutaneous Fat:  Orbital Region: mild depletion Upper Arm Region: moderate depletion Thoracic and Lumbar Region: mild depletion  Muscle:  Temple Region: mild depletion Clavicle Bone Region: moderate depletion Clavicle and Acromion Bone Region: mild depletion Scapular Bone Region: mild depletion Dorsal Hand: mild  depletion Patellar Region: moderate depletion Anterior Thigh Region: moderate depletion Posterior Calf Region: moderate depletion  Edema: none present  Height: Ht Readings from Last 1 Encounters:  12/05/14 5' (1.524 m)    Weight: Wt Readings from Last 1 Encounters:  12/06/14 110 lb 7.2 oz (50.1 kg)    Ideal Body Weight: 100#  % Ideal Body Weight: 110%  Wt Readings from Last 10 Encounters:  12/06/14 110 lb 7.2 oz (50.1 kg)  04/10/14 123 lb 11.2 oz (56.11 kg)  04/11/13 123 lb 8 oz (56.019 kg)    Usual Body Weight: 123#  % Usual Body Weight: 89%  BMI:  Body mass index is 21.57 kg/(m^2). Normal weight range  Estimated Nutritional Needs: Kcal: 1500-1700 Protein: 60-70 grams Fluid: 1.5-1.7 L  Skin: lt knee laceration  Diet Order: Diet NPO time specified  EDUCATION NEEDS: -Education not appropriate at this time   Intake/Output Summary (Last 24 hours) at 12/06/14 1048 Last data filed at 12/06/14 0900  Gross per 24 hour  Intake   1445 ml  Output    700 ml  Net    745 ml    Last BM: 12/05/14  Labs:   Recent Labs Lab 12/05/14 0933 12/06/14 0415  NA 138 141  K 3.3* 2.5*  CL 100 108  CO2 24 25  BUN 48* 56*  CREATININE 1.99* 1.60*  CALCIUM 9.8 8.7  MG  --  1.9  GLUCOSE 176* 85    CBG (last 3)  No results for input(s): GLUCAP in the last 72 hours.  Scheduled Meds: . cefTRIAXone (ROCEPHIN)  IV  1 g Intravenous Q24H  . levothyroxine  12.5 mcg Intravenous Daily  . potassium chloride  10 mEq Intravenous  Q1 Hr x 6  . sodium chloride  3 mL Intravenous Q12H    Continuous Infusions: . sodium chloride 75 mL/hr at 12/05/14 1222  . sodium chloride 75 mL/hr at 12/06/14 0800  . pantoprozole (PROTONIX) infusion 8 mg/hr (12/06/14 0847)    Past Medical History  Diagnosis Date  . HTN (hypertension)   . Dyslipidemia     On Mevacor - followed by PCP  . Carotid artery disease without cerebral infarction     Right internal carotid: 70-90%, left 60-70%;  asymptomatic --> moderately increased RICA velocities on 01/2014 dopplers.  . Aortic valve sclerosis 2004    Echo showed normal LV function, some mild diastolic dysfunction. Aortic sclerosis but no stenosis.  . Glaucoma   . History of heart attack 1960s    No WMA on Echo    Past Surgical History  Procedure Laterality Date  . Appendectomy    . Hiatal hernia repair    . Transthoracic echocardiogram  2004    LV EF normal, LA mildly dilated, arial septum is aneurysmal, mild aortic sclerosis, mild mitral annular calcification, mild MR, mild TR, RVSP 40-70mmHg (mod pulm HTN)  . Carotid doppler  2011; 2015    RICA 70-99% diameter reduction, LICA 50-69% diameter reduction, right RCA significantly elevated velocities;; moderately increased when compared to 2011.    Emslee Lopezmartinez A. Mayford Knife, RD, LDN, CDE Pager: [phone] After hours Pager: [phone]

## 2014-12-06 NOTE — Transfer of Care (Signed)
Immediate Anesthesia Transfer of Care Note  Patient: Sheila Murphy  Procedure(s) Performed: Procedure(s): ESOPHAGOGASTRODUODENOSCOPY (EGD) (N/A)  Patient Location: Endoscopy Unit  Anesthesia Type:MAC  Level of Consciousness: awake and alert   Airway & Oxygen Therapy: Patient Spontanous Breathing and Patient connected to nasal cannula oxygen  Post-op Assessment: Report given to RN and Post -op Vital signs reviewed and stable  Post vital signs: Reviewed and stable  Last Vitals:  Filed Vitals:   12/06/14 1255  BP:   Pulse: 88  Temp:   Resp:     Complications: No apparent anesthesia complications

## 2014-12-06 NOTE — Progress Notes (Signed)
Discussed with children Sheila Murphy and Sheila Murphy will obtain phone consent for EGD. Pt has been taking Aleve x 3 daily.

## 2014-12-06 NOTE — Anesthesia Preprocedure Evaluation (Signed)
Anesthesia Evaluation    Airway        Dental   Pulmonary          Cardiovascular hypertension,     Neuro/Psych    GI/Hepatic   Endo/Other    Renal/GU      Musculoskeletal   Abdominal   Peds  Hematology   Anesthesia Other Findings   Reproductive/Obstetrics                             Anesthesia Physical Anesthesia Plan  ASA: III  Anesthesia Plan:    Post-op Pain Management:    Induction:   Airway Management Planned:   Additional Equipment:   Intra-op Plan:   Post-operative Plan:   Informed Consent:   Plan Discussed with:   Anesthesia Plan Comments:         Anesthesia Quick Evaluation  

## 2014-12-06 NOTE — Interval H&P Note (Signed)
History and Physical Interval Note:  12/06/2014 12:28 PM  Sheila Murphy  has presented today for surgery, with the diagnosis of gi bleed  The various methods of treatment have been discussed with the patient and family. After consideration of risks, benefits and other options for treatment, the patient has consented to  Procedure(s): ESOPHAGOGASTRODUODENOSCOPY (EGD) (N/A) as a surgical intervention .  The patient's history has been reviewed, patient examined, no change in status, stable for surgery.  I have reviewed the patient's chart and labs.  Questions were answered to the patient's satisfaction.     Javarius Tsosie JR,[name]

## 2014-12-07 DIAGNOSIS — K264 Chronic or unspecified duodenal ulcer with hemorrhage: Principal | ICD-10-CM

## 2014-12-07 LAB — CBC
HEMATOCRIT: 26.4 % — AB (ref 36.0–46.0)
HEMATOCRIT: 17.9 % — AB (ref 36.0–46.0)
Hemoglobin: 9.3 g/dL — ABNORMAL LOW (ref 12.0–15.0)
Hemoglobin: 6.3 g/dL — CL (ref 12.0–15.0)
MCH: 30.3 pg (ref 26.0–34.0)
MCH: 30.9 pg (ref 26.0–34.0)
MCHC: 35.2 g/dL (ref 30.0–36.0)
MCHC: 35.2 g/dL (ref 30.0–36.0)
MCV: 86 fL (ref 78.0–100.0)
MCV: 87.7 fL (ref 78.0–100.0)
PLATELETS: 178 10*3/uL (ref 150–400)
Platelets: 193 10*3/uL (ref 150–400)
RBC: 3.07 MIL/uL — AB (ref 3.87–5.11)
RBC: 2.04 MIL/uL — AB (ref 3.87–5.11)
RDW: 14.8 % (ref 11.5–15.5)
RDW: 14.5 % (ref 11.5–15.5)
WBC: 9.9 10*3/uL (ref 4.0–10.5)
WBC: 10.7 10*3/uL — ABNORMAL HIGH (ref 4.0–10.5)

## 2014-12-07 LAB — BASIC METABOLIC PANEL
Anion gap: 5 (ref 5–15)
BUN: 31 mg/dL — AB (ref 6–23)
CHLORIDE: 113 mmol/L — AB (ref 96–112)
CO2: 21 mmol/L (ref 19–32)
Calcium: 8.1 mg/dL — ABNORMAL LOW (ref 8.4–10.5)
Creatinine, Ser: 1.27 mg/dL — ABNORMAL HIGH (ref 0.50–1.10)
GFR, EST AFRICAN AMERICAN: 42 mL/min — AB (ref >90)
GFR, EST NON AFRICAN AMERICAN: 36 mL/min — AB (ref >90)
GLUCOSE: 85 mg/dL (ref 70–99)
POTASSIUM: 3.9 mmol/L (ref 3.5–5.1)
SODIUM: 139 mmol/L (ref 135–145)

## 2014-12-07 LAB — URINE CULTURE

## 2014-12-07 LAB — PREPARE RBC (CROSSMATCH)

## 2014-12-07 MED ORDER — ACETAMINOPHEN 500 MG PO TABS: 500.0000 mg | ORAL_TABLET | Freq: Four times a day (QID) | ORAL | Status: DC | PRN

## 2014-12-07 MED ORDER — ONDANSETRON HCL 4 MG/2ML IJ SOLN
4.0000 mg | Freq: Three times a day (TID) | INTRAMUSCULAR | Status: DC | PRN
  Administered 2014-12-11 – 2014-12-12 (×2): 4 mg via INTRAVENOUS
  Filled 2014-12-07 (×2): qty 2

## 2014-12-07 MED ORDER — SODIUM CHLORIDE 0.9 % IV SOLN
Freq: Once | INTRAVENOUS | Status: AC
  Administered 2014-12-09 (×2): via INTRAVENOUS

## 2014-12-07 MED ORDER — PANTOPRAZOLE SODIUM 40 MG PO TBEC
40.0000 mg | DELAYED_RELEASE_TABLET | Freq: Two times a day (BID) | ORAL | Status: DC
  Administered 2014-12-07 – 2014-12-09 (×5): 40 mg via ORAL
  Filled 2014-12-07 (×5): qty 1

## 2014-12-07 NOTE — Progress Notes (Signed)
[facility] PROGRESS NOTE Subjective No signs of bleeding overnight.  Objective: Vital signs in last 24 hours: Temp:  [98.3 F (36.8 C)-99 F (37.2 C)] 98.4 F (36.9 C) (02/20 0400) Pulse Rate:  [58-88] 64 (02/20 0400) Resp:  [14-22] 18 (02/20 0400) BP: (138-196)/(35-59) 138/46 mmHg (02/20 0400) SpO2:  [97 %-100 %] 100 % (02/20 0400) Weight:  [52 kg (114 lb 10.2 oz)] 52 kg (114 lb 10.2 oz) (02/20 0500) Last BM Date: 12/06/14  Intake/Output from previous day: 02/19 0701 - 02/20 0700 In: 2288 [P.O.:360; I.V.:1528; IV Piggyback:400] Out: -  Intake/Output this shift:    PE:  Abdomen--soft , nontender.  Lab Results:  Recent Labs  12/05/14 0933 12/05/14 1800 12/06/14 0415 12/07/14 0320  WBC 14.1* 19.3* 14.8* 9.9  HGB 6.7* 10.5* 9.6* 9.3*  HCT 19.7* 29.9* 26.6* 26.4*  PLT 242 177 197 193   BMET  Recent Labs  12/05/14 0933 12/06/14 0415 12/07/14 0320  NA 138 141 139  K 3.3* 2.5* 3.9  CL 100 108 113*  CO2 24 25 21   CREATININE 1.99* 1.60* 1.27*   LFT  Recent Labs  12/05/14 0933 12/06/14 0415  PROT 5.3* 4.7*  AST 30 27  ALT 12 13  ALKPHOS 43 40  BILITOT 0.9 1.0   PT/INR  Recent Labs  12/06/14 0415  LABPROT 16.5*  INR 1.32   PANCREAS No results for input(s): LIPASE in the last 72 hours.       Studies/Results: No results found.  Medications: I have reviewed the patient's current medications.  Assessment/Plan: 1. Large DU. No active bleeding will stop protonix drip and change to PO BID and advance to full liquids.   Aalaya Yadao JR,Brendolyn Stockley L 12/07/2014, 8:17 AM

## 2014-12-07 NOTE — Progress Notes (Signed)
CRITICAL VALUE ALERT  Critical value received:  hgb 6.3  Date of notification: 12/07/2014  Time of notification: 2015  Critical value read back: yes  Nurse who received alert: Rosamaria LintsKelsey Elspeth Blucher, RN  MD notified (1st page):  Bretta BangEasterwood  Time of first page: 2025  MD notified (2nd page):  Time of second page:  Responding MD:  Bretta BangEasterwood  Time MD responded: 2030

## 2014-12-07 NOTE — Progress Notes (Signed)
[facility] TEAM 1 - Stepdown/ICU TEAM Progress Note  Neka Bise XBM:[id number] DOB: 09/02/1922 DOA: 12/05/2014 PCP: Alva Garnet., MD  Admit HPI / Brief Narrative: 81 y.o. Female with history of hypertension, hypothyroidism, dyslipidemia, known aortic valve sclerosis, asymptomatic right carotid artery disease, and osteoarthritis on regular NSAIDs who was sent to the emergency room from the nursing home after having 2 bowel movements with a combination of blood and melena. The patient also reported that the night before after eating a sandwich she got nauseous and vomited and noted some red blood in the emesis.   After arrival to the ER the patient was noted to be extremely pale in color. Although not hypotensive her blood pressure was quite soft for a patient with reported hypertension with a reading of 115/41. Her MAP was below 65 and her hemoglobin was very low at 6.7. Baseline hemoglobin unknown. Her BUN was elevated at 48 with a creatinine of 1.99. Baseline renal function unknown. Her urinalysis was also abnormal and appeared consistent with a likely urinary tract infection. The emergency room physician ordered 2 units packed red blood cell for transfusion. She was fecal occult blood positive.  HPI/Subjective: Pt is awake, but mildly confused.  She denies cp, sob, or abdom pain, but does report that she has had some nausea intermittently w/ attempts at oral intake.  Her flexiseal is collecting what looks like melena.    Assessment/Plan:  GIB  -medium gastric and large duodenal ulcers noted via EGD - cont PPI but transitioning to oral dosing - slowly advance diet as tolerated   Acute blood loss anemia -baseline hemoglobin unknown -admission hemoglobin 6.7 -s/p 2 units packed red cells thus far -Hgb trending down - recheck this evening and in AM - transfuse as needed to keep Hgb 7.0 or >  Acute renal failure crt peaked at 1.99 - much improved w/ volume resuscitation/transfusion    Essential hypertension -BP stable at present   Syncope due to orthostatic hypotension -Patient experienced a fall preceded by dizziness prior to admission on 2/17 -Suspect was due to symptomatic anemia  -follow s/p transfusion   Hypokalemia -replaced via IV and oral route - now at goal - Mg normal - recheck in AM   E coli UTI -cont Rocephin based on sensitivities - change to ceftin in AM if continues to tolerate orals   Aortic valve sclerosis -No murmur auscultated on exam -Last echo in 2004 revealed normal LV function with mild diastolic dysfunction and no aortic stenosis  Hypothyroidism -cont Synthroid   Code Status: FULL Family Communication: no family present at time of exam Disposition Plan: SDU  Consultants: [facility]   Procedures: EGD - 2/19 - mucosa of the esophagus appeared normal - medium sized ulcer in the prepyloric region of the stomach - large ulcer in the duodenal bulb  Antibiotics: Rocephin 2/18 >  DVT prophylaxis: SCDs  Objective: Blood pressure 127/43, pulse 80, temperature 99.5 F (37.5 C), temperature source Oral, resp. rate 15, height 5' (1.524 m), weight 52 kg (114 lb 10.2 oz), SpO2 99 %.  Intake/Output Summary (Last 24 hours) at 12/07/14 1635 Last data filed at 12/07/14 0600  Gross per 24 hour  Intake    475 ml  Output      0 ml  Net    475 ml   Exam: General: No acute respiratory distress Lungs: Clear to auscultation bilaterally without wheezes or crackles Cardiovascular: Regular rate and rhythm without gallop or rub  Abdomen: Nontender, nondistended, soft, bowel  sounds positive, no rebound, no ascites, no appreciable mass Extremities: No significant cyanosis, clubbing, or edema bilateral lower extremities  Data Reviewed: Basic Metabolic Panel:  Recent Labs Lab 12/05/14 0933 12/06/14 0415 12/07/14 0320  NA 138 141 139  K 3.3* 2.5* 3.9  CL 100 108 113*  CO2 GLUCOSE 176* 85 85  BUN 48* 56* 31*  CREATININE 1.99*  1.60* 1.27*  CALCIUM 9.8 8.7 8.1*  MG  --  1.9  --     Liver Function Tests:  Recent Labs Lab 12/05/14 0933 12/06/14 0415  AST 30 27  ALT 12 13  ALKPHOS 43 40  BILITOT 0.9 1.0  PROT 5.3* 4.7*  ALBUMIN 2.7* 2.6*   Coags:  Recent Labs Lab 12/06/14 0415  INR 1.32    Recent Labs Lab 12/06/14 0415  APTT 27    CBC:  Recent Labs Lab 12/05/14 0933 12/05/14 1800 12/06/14 0415 12/07/14 0320  WBC 14.1* 19.3* 14.8* 9.9  NEUTROABS 13.1*  --   --   --   HGB 6.7* 10.5* 9.6* 9.3*  HCT 19.7* 29.9* 26.6* 26.4*  MCV 88.7 86.2 84.7 86.0  PLT 242 177 197 193    Recent Results (from the past 240 hour(s))  Culture, Urine     Status: None   Collection Time: 12/05/14  9:50 AM  Result Value Ref Range Status   Specimen Description URINE, RANDOM  Final   Special Requests NONE  Final   Colony Count   Final    >=100,000 COLONIES/ML Performed at [facility]    Culture   Final    ESCHERICHIA COLI Performed at [facility]    Report Status 12/07/2014 FINAL  Final   Organism ID, Bacteria ESCHERICHIA COLI  Final      Susceptibility   Escherichia coli - MIC*    AMPICILLIN >=32 RESISTANT Resistant     CEFAZOLIN <=4 SENSITIVE Sensitive     CEFTRIAXONE <=1 SENSITIVE Sensitive     CIPROFLOXACIN <=0.25 SENSITIVE Sensitive     GENTAMICIN 2 SENSITIVE Sensitive     LEVOFLOXACIN <=0.12 SENSITIVE Sensitive     NITROFURANTOIN <=16 SENSITIVE Sensitive     TOBRAMYCIN 2 SENSITIVE Sensitive     TRIMETH/SULFA <=20 SENSITIVE Sensitive     PIP/TAZO <=4 SENSITIVE Sensitive     * ESCHERICHIA COLI  MRSA PCR Screening     Status: None   Collection Time: 12/05/14  8:15 PM  Result Value Ref Range Status   MRSA by PCR NEGATIVE NEGATIVE Final    Comment:        The GeneXpert MRSA Assay (FDA approved for NASAL specimens only), is one component of a comprehensive MRSA colonization surveillance program. It is not intended to diagnose MRSA infection nor to guide or monitor  treatment for MRSA infections.   Clostridium Difficile by PCR     Status: None   Collection Time: 12/05/14  9:40 PM  Result Value Ref Range Status   C difficile by pcr NEGATIVE NEGATIVE Final     Studies:  Recent x-ray studies have been reviewed in detail by the Attending Physician  Scheduled Meds:  Scheduled Meds: . amLODipine  5 mg Oral Daily  . cefTRIAXone (ROCEPHIN)  IV  1 g Intravenous Q24H  . levothyroxine  25 mcg Oral QAC breakfast  . pantoprazole  40 mg Oral BID  . potassium chloride  40 mEq Oral BID  . sodium chloride  3 mL Intravenous Q12H    Time spent  on care of this patient: 35 mins   Kaileen Bronkema T , MD   [facility] Office  [phone] Pager - Text Page per Loretha StaplerAmion as per below:  On-Call/Text Page:      [email or web address]      password [email or web address]  If 7PM-7AM, please contact night-coverage [email or web address] Password [email or web address] 12/07/2014, 4:35 PM   LOS: 2 days

## 2014-12-07 NOTE — Plan of Care (Signed)
Problem: Discharge Progression Outcomes Goal: Pain controlled with appropriate interventions No c/o pain or any discomfort Goal: Tolerating diet Remains on full liquids at this time

## 2014-12-08 DIAGNOSIS — E43 Unspecified severe protein-calorie malnutrition: Secondary | ICD-10-CM

## 2014-12-08 LAB — COMPREHENSIVE METABOLIC PANEL
ALT: 14 U/L (ref 0–35)
ANION GAP: 4 — AB (ref 5–15)
AST: 25 U/L (ref 0–37)
Albumin: 2.6 g/dL — ABNORMAL LOW (ref 3.5–5.2)
Alkaline Phosphatase: 35 U/L — ABNORMAL LOW (ref 39–117)
BUN: 35 mg/dL — ABNORMAL HIGH (ref 6–23)
CALCIUM: 8.3 mg/dL — AB (ref 8.4–10.5)
CO2: 23 mmol/L (ref 19–32)
CREATININE: 1.3 mg/dL — AB (ref 0.50–1.10)
Chloride: 115 mmol/L — ABNORMAL HIGH (ref 96–112)
GFR calc Af Amer: 41 mL/min — ABNORMAL LOW (ref >90)
GFR calc non Af Amer: 35 mL/min — ABNORMAL LOW (ref >90)
Glucose, Bld: 90 mg/dL (ref 70–99)
POTASSIUM: 3.9 mmol/L (ref 3.5–5.1)
Sodium: 142 mmol/L (ref 135–145)
Total Bilirubin: 0.5 mg/dL (ref 0.3–1.2)
Total Protein: 4.5 g/dL — ABNORMAL LOW (ref 6.0–8.3)

## 2014-12-08 LAB — CBC
HCT: 27.2 % — ABNORMAL LOW (ref 36.0–46.0)
Hemoglobin: 9.5 g/dL — ABNORMAL LOW (ref 12.0–15.0)
MCH: 30.4 pg (ref 26.0–34.0)
MCHC: 34.9 g/dL (ref 30.0–36.0)
MCV: 86.9 fL (ref 78.0–100.0)
Platelets: 164 10*3/uL (ref 150–400)
RBC: 3.13 MIL/uL — ABNORMAL LOW (ref 3.87–5.11)
RDW: 14 % (ref 11.5–15.5)
WBC: 9.1 10*3/uL (ref 4.0–10.5)

## 2014-12-08 MED ORDER — CEFUROXIME AXETIL 500 MG PO TABS
500.0000 mg | ORAL_TABLET | Freq: Two times a day (BID) | ORAL | Status: DC
  Administered 2014-12-08 – 2014-12-09 (×2): 500 mg via ORAL
  Filled 2014-12-08 (×4): qty 1

## 2014-12-08 NOTE — Progress Notes (Signed)
CMT notified of patient having 10 beat run of SVT. Patient sitting up in bed eating. HR 74, BP 148/41, 99% RA, Resp 20, Temp 98.7. Dr. Sharon SellerMcClung notified of findings.

## 2014-12-08 NOTE — Progress Notes (Signed)
[facility] PROGRESS NOTE Subjective Hg dropped last PM got 2 units.  Objective: Vital signs in last 24 hours: Temp:  [98.3 F (36.8 C)-99.8 F (37.7 C)] 98.4 F (36.9 C) (02/21 0510) Pulse Rate:  [68-80] 69 (02/21 0510) Resp:  [15-23] 18 (02/21 0510) BP: (107-147)/(36-110) 142/36 mmHg (02/21 0510) SpO2:  [98 %-100 %] 100 % (02/21 0510) Last BM Date: 12/07/14  Intake/Output from previous day: 02/20 0701 - 02/21 0700 In: 1015 [I.V.:375; Blood:640] Out: -  Intake/Output this shift:    PE: General--alert confused  Abdomen--soft nontender  Lab Results:  Recent Labs  12/05/14 1800 12/06/14 0415 12/07/14 0320 12/07/14 1900 12/08/14 0330  WBC 19.3* 14.8* 9.9 10.7* 9.1  HGB 10.5* 9.6* 9.3* 6.3* 9.5*  HCT 29.9* 26.6* 26.4* 17.9* 27.2*  PLT 177 197 193 178 164   BMET  Recent Labs  12/05/14 0933 12/06/14 0415 12/07/14 0320 12/08/14 0330  NA 138 141 139 142  K 3.3* 2.5* 3.9 3.9  CL 100 108 113* 115*  CO2 24 25 21 23   CREATININE 1.99* 1.60* 1.27* 1.30*   LFT  Recent Labs  12/05/14 0933 12/06/14 0415 12/08/14 0330  PROT 5.3* 4.7* 4.5*  AST 30 27 25   ALT 12 13 14   ALKPHOS 43 40 35*  BILITOT 0.9 1.0 0.5   PT/INR  Recent Labs  12/06/14 0415  LABPROT 16.5*  INR 1.32   PANCREAS No results for input(s): LIPASE in the last 72 hours.       Studies/Results: No results found.  Medications: I have reviewed the patient's current medications.  Assessment/Plan: 1. Large DU. Apparently still some bleeding. On Protonix, will continue FL diet for now. Continue support and hopefully the bleeding will stop with continued treatment.   Mechel Schutter JR,Imari Reen L 12/08/2014, 7:44 AM

## 2014-12-08 NOTE — Progress Notes (Signed)
[facility] TEAM 1 - Stepdown/ICU TEAM Progress Note  Sheila Murphy ZOX:[id number] DOB: 1922-10-15 DOA: 12/05/2014 PCP: Alva Garnet., MD  Admit HPI / Brief Narrative: 81 y.o. Female with history of hypertension, hypothyroidism, dyslipidemia, known aortic valve sclerosis, asymptomatic right carotid artery disease, and osteoarthritis on regular NSAIDs who was sent to the emergency room from the nursing home after having 2 bowel movements with a combination of blood and melena. The patient also reported that the night before after eating a sandwich she got nauseous and vomited and noted some red blood in the emesis.   After arrival to the ER the patient was noted to be extremely pale in color. Although not hypotensive her blood pressure was quite soft for a patient with reported hypertension with a reading of 115/41. Her MAP was below 65 and her hemoglobin was very low at 6.7. Baseline hemoglobin unknown. Her BUN was elevated at 48 with a creatinine of 1.99. Baseline renal function unknown. Her urinalysis was also abnormal and appeared consistent with a likely urinary tract infection. The emergency room physician ordered 2 units packed red blood cell for transfusion. She was fecal occult blood positive.  HPI/Subjective: Hgb dropped further last night requiring transfusion of 2 additional units of PRBCs. Pt is c/o nausea and ongoing melena. She denies cp or sob.      Assessment/Plan:  GIB  -medium gastric and large duodenal ulcers noted via EGD - cont PPI   -remain on clears only as pt appears to still be bleeding  -GI following w/ [place]    Acute blood loss anemia -baseline hemoglobin unknown -admission hemoglobin 6.7 -Hgb trended down over last 24hrs, leading to need for further transfusion  -s/p 4 units packed red cells thus far transfuse prn to keep Hgb 7.0 or >  Acute renal failure -crt peaked at 1.99 - much improved w/ volume resuscitation/transfusion, but now vacillating - follow    Essential hypertension -BP stable at present   Syncope due to orthostatic hypotension -Patient experienced a fall preceded by dizziness prior to admission on 2/17 -Suspect was due to symptomatic anemia   Hypokalemia -replaced via IV and oral route - now at goal - Mg normal   E coli UTI -cont Rocephin based on sensitivities - change to ceftin to complete course    Aortic valve sclerosis -No murmur auscultated on exam -Last echo in 2004 revealed normal LV function with mild diastolic dysfunction and no aortic stenosis  Hypothyroidism -cont Synthroid   Code Status: FULL Family Communication: no family present at time of exam Disposition Plan: SDU  Consultants: [facility]   Procedures: EGD - 2/19 - mucosa of the esophagus appeared normal - medium sized ulcer in the prepyloric region of the stomach - large ulcer in the duodenal bulb  Antibiotics: Rocephin 2/18 > 2/20 Ceftin 2/21 >  DVT prophylaxis: SCDs  Objective: Blood pressure 148/41, pulse 76, temperature 98.8 F (37.1 C), temperature source Oral, resp. rate 24, height 5' (1.524 m), weight 52 kg (114 lb 10.2 oz), SpO2 100 %.  Intake/Output Summary (Last 24 hours) at 12/08/14 1114 Last data filed at 12/08/14 0700  Gross per 24 hour  Intake    915 ml  Output      0 ml  Net    915 ml   Exam: General: No acute respiratory distress - alert and conversant  Lungs: Clear to auscultation bilaterally without wheezes or crackles Cardiovascular: Regular rate and rhythm without gallop or rub or M Abdomen: Nontender, nondistended,  soft, bowel sounds positive, no rebound, no ascites, no appreciable mass Extremities: No significant cyanosis, clubbing, edema bilateral lower extremities  Data Reviewed: Basic Metabolic Panel:  Recent Labs Lab 12/05/14 0933 12/06/14 0415 12/07/14 0320 12/08/14 0330  NA 138 141 139 142  K 3.3* 2.5* 3.9 3.9  CL 100 108 113* 115*  CO2 GLUCOSE 176* 85 85 90  BUN 48* 56* 31*  35*  CREATININE 1.99* 1.60* 1.27* 1.30*  CALCIUM 9.8 8.7 8.1* 8.3*  MG  --  1.9  --   --     Liver Function Tests:  Recent Labs Lab 12/05/14 0933 12/06/14 0415 12/08/14 0330  AST ALT ALKPHOS 43 40 35*  BILITOT 0.9 1.0 0.5  PROT 5.3* 4.7* 4.5*  ALBUMIN 2.7* 2.6* 2.6*   Coags:  Recent Labs Lab 12/06/14 0415  INR 1.32    Recent Labs Lab 12/06/14 0415  APTT 27    CBC:  Recent Labs Lab 12/05/14 0933 12/05/14 1800 12/06/14 0415 12/07/14 0320 12/07/14 1900 12/08/14 0330  WBC 14.1* 19.3* 14.8* 9.9 10.7* 9.1  NEUTROABS 13.1*  --   --   --   --   --   HGB 6.7* 10.5* 9.6* 9.3* 6.3* 9.5*  HCT 19.7* 29.9* 26.6* 26.4* 17.9* 27.2*  MCV 88.7 86.2 84.7 86.0 87.7 86.9  PLT 242 177 197 193 178 164    Recent Results (from the past 240 hour(s))  Culture, Urine     Status: None   Collection Time: 12/05/14  9:50 AM  Result Value Ref Range Status   Specimen Description URINE, RANDOM  Final   Special Requests NONE  Final   Colony Count   Final    >=100,000 COLONIES/ML Performed at [facility]    Culture   Final    ESCHERICHIA COLI Performed at [facility]    Report Status 12/07/2014 FINAL  Final   Organism ID, Bacteria ESCHERICHIA COLI  Final      Susceptibility   Escherichia coli - MIC*    AMPICILLIN >=32 RESISTANT Resistant     CEFAZOLIN <=4 SENSITIVE Sensitive     CEFTRIAXONE <=1 SENSITIVE Sensitive     CIPROFLOXACIN <=0.25 SENSITIVE Sensitive     GENTAMICIN 2 SENSITIVE Sensitive     LEVOFLOXACIN <=0.12 SENSITIVE Sensitive     NITROFURANTOIN <=16 SENSITIVE Sensitive     TOBRAMYCIN 2 SENSITIVE Sensitive     TRIMETH/SULFA <=20 SENSITIVE Sensitive     PIP/TAZO <=4 SENSITIVE Sensitive     * ESCHERICHIA COLI  MRSA PCR Screening     Status: None   Collection Time: 12/05/14  8:15 PM  Result Value Ref Range Status   MRSA by PCR NEGATIVE NEGATIVE Final    Comment:        The GeneXpert MRSA Assay (FDA approved for NASAL  specimens only), is one component of a comprehensive MRSA colonization surveillance program. It is not intended to diagnose MRSA infection nor to guide or monitor treatment for MRSA infections.   Clostridium Difficile by PCR     Status: None   Collection Time: 12/05/14  9:40 PM  Result Value Ref Range Status   C difficile by pcr NEGATIVE NEGATIVE Final     Studies:  Recent x-ray studies have been reviewed in detail by the Attending Physician  Scheduled Meds:  Scheduled Meds: . sodium chloride   Intravenous Once  . amLODipine  5 mg Oral Daily  .  cefTRIAXone (ROCEPHIN)  IV  1 g Intravenous Q24H  . levothyroxine  25 mcg Oral QAC breakfast  . pantoprazole  40 mg Oral BID  . sodium chloride  3 mL Intravenous Q12H    Time spent on care of this patient: 35 mins   Irving Lubbers T , MD   [facility] Office  [phone] Pager - Text Page per Loretha StaplerAmion as per below:  On-Call/Text Page:      [email or web address]      password [email or web address]  If 7PM-7AM, please contact night-coverage [email or web address] Password [email or web address] 12/08/2014, 11:14 AM   LOS: 3 days

## 2014-12-09 ENCOUNTER — Inpatient Hospital Stay (HOSPITAL_COMMUNITY): Payer: Commercial Managed Care - HMO

## 2014-12-09 ENCOUNTER — Encounter (HOSPITAL_COMMUNITY)
Admission: EM | Disposition: A | Discharge status: Home Health | Payer: Medicare PPO | Source: Home / Self Care | Attending: Internal Medicine

## 2014-12-09 ENCOUNTER — Inpatient Hospital Stay (HOSPITAL_COMMUNITY): Payer: Commercial Managed Care - HMO | Admitting: Anesthesiology

## 2014-12-09 ENCOUNTER — Encounter (HOSPITAL_COMMUNITY): Payer: Self-pay | Admitting: Gastroenterology

## 2014-12-09 DIAGNOSIS — K922 Gastrointestinal hemorrhage, unspecified: Secondary | ICD-10-CM | POA: Insufficient documentation

## 2014-12-09 DIAGNOSIS — E039 Hypothyroidism, unspecified: Secondary | ICD-10-CM

## 2014-12-09 HISTORY — PX: RADIOLOGY WITH ANESTHESIA: SHX6223

## 2014-12-09 HISTORY — PX: ESOPHAGOGASTRODUODENOSCOPY: SHX5428

## 2014-12-09 LAB — TYPE AND SCREEN
ABO/RH(D): O POS
Antibody Screen: NEGATIVE
UNIT DIVISION: 0
UNIT DIVISION: 0
Unit division: 0
Unit division: 0

## 2014-12-09 LAB — POCT I-STAT 3, ART BLOOD GAS (G3+)
ACID-BASE DEFICIT: 20 mmol/L — AB (ref 0.0–2.0)
Bicarbonate: 7.1 mEq/L — ABNORMAL LOW (ref 20.0–24.0)
O2 Saturation: 99 %
TCO2: 8 mmol/L (ref 0–100)
pCO2 arterial: 20.2 mmHg — ABNORMAL LOW (ref 35.0–45.0)
pH, Arterial: 7.142 — CL (ref 7.350–7.450)
pO2, Arterial: 142 mmHg — ABNORMAL HIGH (ref 80.0–100.0)

## 2014-12-09 LAB — CBC
HCT: 18.1 % — ABNORMAL LOW (ref 36.0–46.0)
HEMATOCRIT: 52.3 % — AB (ref 36.0–46.0)
HEMATOCRIT: 50.3 % — AB (ref 36.0–46.0)
HEMOGLOBIN: 6.2 g/dL — AB (ref 12.0–15.0)
HEMOGLOBIN: 17.5 g/dL — AB (ref 12.0–15.0)
Hemoglobin: 18.2 g/dL — ABNORMAL HIGH (ref 12.0–15.0)
MCH: 29.8 pg (ref 26.0–34.0)
MCH: 29.8 pg (ref 26.0–34.0)
MCH: 29.6 pg (ref 26.0–34.0)
MCHC: 34.3 g/dL (ref 30.0–36.0)
MCHC: 29.8 g/dL — AB (ref 30.0–36.0)
MCHC: 34.8 g/dL (ref 30.0–36.0)
MCV: 87 fL (ref 78.0–100.0)
MCV: 85.7 fL (ref 78.0–100.0)
MCV: 85 fL (ref 78.0–100.0)
PLATELETS: DECREASED 10*3/uL (ref 150–400)
Platelets: 128 10*3/uL — ABNORMAL LOW (ref 150–400)
Platelets: DECREASED 10*3/uL (ref 150–400)
RBC: 2.08 MIL/uL — AB (ref 3.87–5.11)
RBC: 6.1 MIL/uL — ABNORMAL HIGH (ref 3.87–5.11)
RBC: 5.92 MIL/uL — ABNORMAL HIGH (ref 3.87–5.11)
RDW: 14.8 % (ref 11.5–15.5)
RDW: 34.8 % — ABNORMAL HIGH (ref 11.5–15.5)
RDW: 15.3 % (ref 11.5–15.5)
WBC: 8.8 10*3/uL (ref 4.0–10.5)
WBC: 19 10*3/uL — ABNORMAL HIGH (ref 4.0–10.5)
WBC: 23 10*3/uL — ABNORMAL HIGH (ref 4.0–10.5)

## 2014-12-09 LAB — POCT I-STAT 7, (LYTES, BLD GAS, ICA,H+H)
ACID-BASE DEFICIT: 16 mmol/L — AB (ref 0.0–2.0)
Bicarbonate: 9.8 mEq/L — ABNORMAL LOW (ref 20.0–24.0)
CALCIUM ION: 1.01 mmol/L — AB (ref 1.13–1.30)
HCT: 21 % — ABNORMAL LOW (ref 36.0–46.0)
Hemoglobin: 7.1 g/dL — ABNORMAL LOW (ref 12.0–15.0)
O2 SAT: 100 %
PO2 ART: 251 mmHg — AB (ref 80.0–100.0)
POTASSIUM: 4.2 mmol/L (ref 3.5–5.1)
Sodium: 145 mmol/L (ref 135–145)
TCO2: 10 mmol/L (ref 0–100)
pCO2 arterial: 20 mmHg — ABNORMAL LOW (ref 35.0–45.0)
pH, Arterial: 7.282 — ABNORMAL LOW (ref 7.350–7.450)

## 2014-12-09 LAB — BASIC METABOLIC PANEL
ANION GAP: 3 — AB (ref 5–15)
BUN: 39 mg/dL — ABNORMAL HIGH (ref 6–23)
CALCIUM: 7.3 mg/dL — AB (ref 8.4–10.5)
CO2: 20 mmol/L (ref 19–32)
CREATININE: 1.24 mg/dL — AB (ref 0.50–1.10)
Chloride: 119 mmol/L — ABNORMAL HIGH (ref 96–112)
GFR calc non Af Amer: 37 mL/min — ABNORMAL LOW (ref >90)
GFR, EST AFRICAN AMERICAN: 43 mL/min — AB (ref >90)
Glucose, Bld: 114 mg/dL — ABNORMAL HIGH (ref 70–99)
Potassium: 4.2 mmol/L (ref 3.5–5.1)
SODIUM: 142 mmol/L (ref 135–145)

## 2014-12-09 LAB — GLUCOSE, CAPILLARY: Glucose-Capillary: 127 mg/dL — ABNORMAL HIGH (ref 70–99)

## 2014-12-09 LAB — POCT I-STAT, CHEM 8
BUN: 22 mg/dL (ref 6–23)
CALCIUM ION: 1.04 mmol/L — AB (ref 1.13–1.30)
CHLORIDE: 117 mmol/L — AB (ref 96–112)
CREATININE: 1 mg/dL (ref 0.50–1.10)
GLUCOSE: 298 mg/dL — AB (ref 70–99)
HCT: 22 % — ABNORMAL LOW (ref 36.0–46.0)
HEMOGLOBIN: 7.5 g/dL — AB (ref 12.0–15.0)
POTASSIUM: 4.3 mmol/L (ref 3.5–5.1)
Sodium: 145 mmol/L (ref 135–145)
TCO2: 9 mmol/L (ref 0–100)

## 2014-12-09 LAB — PREPARE RBC (CROSSMATCH)

## 2014-12-09 SURGERY — EGD (ESOPHAGOGASTRODUODENOSCOPY)
Anesthesia: Monitor Anesthesia Care

## 2014-12-09 SURGERY — RADIOLOGY WITH ANESTHESIA
Anesthesia: Monitor Anesthesia Care

## 2014-12-09 MED ORDER — SODIUM CHLORIDE 0.9 % IV SOLN: INTRAVENOUS | Status: DC

## 2014-12-09 MED ORDER — LABETALOL HCL 5 MG/ML IV SOLN
INTRAVENOUS | Status: DC | PRN
  Administered 2014-12-09: 5 mg via INTRAVENOUS

## 2014-12-09 MED ORDER — STERILE WATER FOR INJECTION IV SOLN
INTRAVENOUS | Status: DC
  Administered 2014-12-09 – 2014-12-10 (×2): via INTRAVENOUS
  Filled 2014-12-09 (×5): qty 850

## 2014-12-09 MED ORDER — CALCIUM CHLORIDE 10 % IV SOLN
INTRAVENOUS | Status: DC | PRN
  Administered 2014-12-09: 300 mg via INTRAVENOUS

## 2014-12-09 MED ORDER — SODIUM CHLORIDE 0.9 % IJ SOLN
10.0000 mL | Freq: Two times a day (BID) | INTRAMUSCULAR | Status: DC
  Administered 2014-12-09 – 2014-12-14 (×6): 10 mL via INTRAVENOUS

## 2014-12-09 MED ORDER — FENTANYL CITRATE 0.05 MG/ML IJ SOLN: 25.0000 ug | INTRAMUSCULAR | Status: DC | PRN

## 2014-12-09 MED ORDER — CHLORHEXIDINE GLUCONATE 0.12 % MT SOLN
15.0000 mL | Freq: Two times a day (BID) | OROMUCOSAL | Status: DC
  Administered 2014-12-09 – 2014-12-15 (×12): 15 mL via OROMUCOSAL
  Filled 2014-12-09 (×9): qty 15

## 2014-12-09 MED ORDER — PROPOFOL 10 MG/ML IV BOLUS
INTRAVENOUS | Status: DC | PRN
  Administered 2014-12-09: 10 mg via INTRAVENOUS

## 2014-12-09 MED ORDER — LACTATED RINGERS IV SOLN
INTRAVENOUS | Status: DC | PRN
  Administered 2014-12-09: 15:00:00 via INTRAVENOUS

## 2014-12-09 MED ORDER — PHENYLEPHRINE HCL 10 MG/ML IJ SOLN
10.0000 mg | INTRAVENOUS | Status: DC | PRN
  Administered 2014-12-09: 50 ug/min via INTRAVENOUS

## 2014-12-09 MED ORDER — ONDANSETRON HCL 4 MG/2ML IJ SOLN
INTRAMUSCULAR | Status: DC | PRN
  Administered 2014-12-09: 4 mg via INTRAVENOUS

## 2014-12-09 MED ORDER — PANTOPRAZOLE SODIUM 40 MG IV SOLR: 40.0000 mg | Freq: Two times a day (BID) | INTRAVENOUS | Status: DC

## 2014-12-09 MED ORDER — DEXTROSE 5 % IV SOLN
10.0000 mg | INTRAVENOUS | Status: DC | PRN
  Administered 2014-12-09: 25 ug/min via INTRAVENOUS

## 2014-12-09 MED ORDER — SODIUM CHLORIDE 0.9 % IV SOLN
1.0000 g | Freq: Once | INTRAVENOUS | Status: AC
  Administered 2014-12-09: 1 g via INTRAVENOUS
  Filled 2014-12-09 (×2): qty 10

## 2014-12-09 MED ORDER — CETYLPYRIDINIUM CHLORIDE 0.05 % MT LIQD
7.0000 mL | Freq: Two times a day (BID) | OROMUCOSAL | Status: DC
  Administered 2014-12-10 – 2014-12-14 (×9): 7 mL via OROMUCOSAL

## 2014-12-09 MED ORDER — ALBUMIN HUMAN 5 % IV SOLN
INTRAVENOUS | Status: DC | PRN
  Administered 2014-12-09: 14:00:00 via INTRAVENOUS

## 2014-12-09 MED ORDER — FENTANYL CITRATE 0.05 MG/ML IJ SOLN
INTRAMUSCULAR | Status: DC | PRN
  Administered 2014-12-09 (×2): 25 ug via INTRAVENOUS

## 2014-12-09 MED ORDER — IOHEXOL 300 MG/ML  SOLN
150.0000 mL | Freq: Once | INTRAMUSCULAR | Status: AC | PRN
  Administered 2014-12-09: 45 mL via INTRAVENOUS

## 2014-12-09 MED ORDER — MEPERIDINE HCL 100 MG/ML IJ SOLN: 6.2500 mg | INTRAMUSCULAR | Status: DC | PRN

## 2014-12-09 MED ORDER — SODIUM CHLORIDE 0.9 % IV SOLN: Freq: Once | INTRAVENOUS | Status: DC

## 2014-12-09 MED ORDER — LEVOTHYROXINE SODIUM 100 MCG IV SOLR
12.5000 ug | Freq: Every day | INTRAVENOUS | Status: DC
  Administered 2014-12-10 – 2014-12-11 (×2): 12.5 ug via INTRAVENOUS
  Filled 2014-12-09 (×2): qty 5

## 2014-12-09 MED ORDER — LIDOCAINE HCL 1 % IJ SOLN
INTRAMUSCULAR | Status: AC
  Administered 2014-12-09: 15:00:00
  Filled 2014-12-09: qty 20

## 2014-12-09 MED ORDER — HALOPERIDOL LACTATE 5 MG/ML IJ SOLN: 1.0000 mg | INTRAMUSCULAR | Status: DC | PRN

## 2014-12-09 MED ORDER — PROMETHAZINE HCL 25 MG/ML IJ SOLN: 6.2500 mg | INTRAMUSCULAR | Status: DC | PRN

## 2014-12-09 MED ORDER — SODIUM CHLORIDE 0.9 % IV BOLUS (SEPSIS): 500.0000 mL | Freq: Once | INTRAVENOUS | Status: DC

## 2014-12-09 MED ORDER — SODIUM CHLORIDE 0.9 % IV SOLN
8.0000 mg/h | INTRAVENOUS | Status: DC
  Administered 2014-12-09 – 2014-12-11 (×5): 8 mg/h via INTRAVENOUS
  Filled 2014-12-09 (×14): qty 80

## 2014-12-09 MED ORDER — CALCIUM CHLORIDE 10 % IV SOLN
INTRAVENOUS | Status: DC | PRN
  Administered 2014-12-09: 200 mg via INTRAVENOUS

## 2014-12-09 MED ORDER — PHENYLEPHRINE HCL 10 MG/ML IJ SOLN
INTRAMUSCULAR | Status: DC | PRN
  Administered 2014-12-09: 40 ug via INTRAVENOUS

## 2014-12-09 NOTE — Progress Notes (Signed)
OT Cancellation Note  Patient Details Name: Sheila GrateLavelle Murphy MRN: [id number] DOB: 08-Mar-1922   Cancelled Treatment:    Reason Eval/Treat Not Completed: Medical issues which prohibited therapy (6.2 hemoglobin.  Will continue to follow.)  Evern BioMayberry, Laiken Nohr Lynn 12/09/2014, 8:35 AM

## 2014-12-09 NOTE — Progress Notes (Signed)
CRITICAL VALUE ALERT  Critical value received:  Hgb 6.2  Date of notification:  12/09/14  Time of notification:  0444  Critical value read back: yes  Nurse who received alert: Armida SansAlyce Cyndi Montejano, RN  MD notified (1st page): Tama GanderKatherine Schorr, NP  Time of first page:  [phone]  MD notified (2nd page):  Time of second page:  Responding MD:  Merdis DelayK Schorr, NP  Time MD responded:  [phone]  Orders received from NP. Will continue to monitor pt closely.

## 2014-12-09 NOTE — Progress Notes (Addendum)
Upon assessment for the third time tonight, pt's rectal tube was out. There was minimal stool in the tubing since much of it was on the bed. After re-insertion of the rectal tube the second time, RN noticed that the stool was leaking copiously around the balloon. Pt did not want it to be re-inserted for the third time tonight so RN left it out. Will continue to monitor pt closely and do frequent peri care as needed.

## 2014-12-09 NOTE — Op Note (Signed)
[facility] [address] [place], [id number]   ENDOSCOPY PROCEDURE REPORT  PATIENT: Sheila Murphy, Sheila Murphy  MR#: [id number] BIRTHDATE: 1922-01-22 , 89  yrs. old GENDER: female ENDOSCOPIST: Willis ModenaWilliam Minah Axelrod, MD REFERRED BY:  [facility] PROCEDURE DATE:  12/09/2014 PROCEDURE:  EGD, diagnostic ASA CLASS:     Class III INDICATIONS:  melena, anemia, history duodenal ulcer. MEDICATIONS: Per Anesthesia TOPICAL ANESTHETIC: Cetacaine Spray  DESCRIPTION OF PROCEDURE: After the risks benefits and alternatives of the procedure were thoroughly explained, informed consent was obtained.  The [employer] Gastroscope Peds [id number] endoscope was introduced through the mouth and advanced to the first portion of the duodenum. The instrument was slowly withdrawn as the mucosa was fully examined.    Findings:  Normal esophagus.  Large amount of fresh red blood seen in the stomach, over 500 cc of which was suctioned through the endoscope.  There was significant pallor in the gastric mucosa, but most of the stomach was able to be examined and no focal site of bleeding was identified.  The pylorus was noted and a large amount of fresh blood was seen extruding from the duodenal bulb retrograde through the pylorus and back into the stomach.  In the duodenal bulb there was a lot of fresh and clotted blood seen.  Blood continued to pool despite near-continuous suctioning of several hundred more cc's of fresh blood.  At this point, it was clear that attempts at endoscopic hemostasis would be futile and endoscopy was aborted.              The scope was then withdrawn from the patient and the procedure completed.  COMPLICATIONS: There were no immediate complications.  ENDOSCOPIC IMPRESSION:     As above.  Destabilizing bleeding from large duodenal ulcer.  RECOMMENDATIONS:     1.  Watch for potential complications of procedure. 2.  PPI infusion. 3.  Volume repletion with blood and  IVF. 4.  Interventional Radiology consulted for consideration of embolization of gastroduodenal artery. 5.  I will discuss case with surgery, but given patient's degree of bleeding, age and comorbidities, I feel attempts at non-surgical hemostatic therapy would be best. 6.  Will transfer patient to ICU after her embolization. 7.  Will follow.  eSigned:  Willis ModenaWilliam Jacky Hartung, MD 12/09/2014 2:35 PM   CC:  CPT CODES: ICD CODES:  The ICD and CPT codes recommended by this software are interpretations from the data that the clinical staff has captured with the software.  The verification of the translation of this report to the ICD and CPT codes and modifiers is the sole responsibility of the health care institution and practicing physician where this report was generated.  [employer] will not be held responsible for the validity of the ICD and CPT codes included on this report.  AMA assumes no liability for data contained or not contained herein. CPT is a Publishing rights managerregistered trademark of the [employer].

## 2014-12-09 NOTE — Anesthesia Postprocedure Evaluation (Signed)
  Anesthesia Post-op Note  Patient: Sheila Murphy  Procedure(s) Performed: Procedure(s): RADIOLOGY WITH ANESTHESIA (N/A)  Patient Location: ICU  Anesthesia Type:MAC  Level of Consciousness: awake and confused  Airway and Oxygen Therapy: Patient Spontanous Breathing and Patient connected to nasal cannula oxygen  Post-op Pain: none  Post-op Assessment: Post-op Vital signs reviewed, Patient's Cardiovascular Status Stable, Respiratory Function Stable, Patent Airway, No signs of Nausea or vomiting and Pain level controlled  Post-op Vital Signs: stable  Last Vitals:  Filed Vitals:   12/09/14 1503  BP:   Pulse:   Temp: 36.1 C  Resp:     Complications: No apparent anesthesia complications

## 2014-12-09 NOTE — Anesthesia Preprocedure Evaluation (Signed)
Anesthesia Evaluation    Airway        Dental   Pulmonary          Cardiovascular hypertension, Pt. on medications     Neuro/Psych    GI/Hepatic   Endo/Other    Renal/GU      Musculoskeletal   Abdominal   Peds  Hematology   Anesthesia Other Findings   Reproductive/Obstetrics                             Anesthesia Physical Anesthesia Plan  ASA: IV and emergent  Anesthesia Plan: MAC   Post-op Pain Management:    Induction:   Airway Management Planned: Mask  Additional Equipment: Arterial line  Intra-op Plan:   Post-operative Plan:   Informed Consent: I have reviewed the patients History and Physical, chart, labs and discussed the procedure including the risks, benefits and alternatives for the proposed anesthesia with the patient or authorized representative who has indicated his/her understanding and acceptance.   Dental advisory given  Plan Discussed with: CRNA, Anesthesiologist and Surgeon  Anesthesia Plan Comments:         Anesthesia Quick Evaluation

## 2014-12-09 NOTE — Consult Note (Signed)
Chief Complaint: Chief Complaint  Patient presents with  . GI Bleeding  active large duodenal ulcer bleeding  Referring Physician(s): Dr Dulce Sellar  History of Present Illness: Sheila Murphy is a 81 y.o. female   Presented from Nursing home with dark bloody stools and weakness 12/05/14 Endoscopy Fri 2/19: no bleeding seen but positive duodenal ulcer Treating with medication and bland/liquid diet New findings of drop in hgb and more bloody stools Dr Dulce Sellar performs scope again today and shows active bleeding Large duodenal ulcer bleeding--- Has requested emergent mesenteric arteriogram with probable embolization Dr Grace Isaac has reviewed imaging and discussed with Dr Dulce Sellar Dr Grace Isaac approves procedure I have seen and examined Sheila Murphy   Past Medical History  Diagnosis Date  . HTN (hypertension)   . Dyslipidemia     On Mevacor - followed by PCP  . Carotid artery disease without cerebral infarction     Right internal carotid: 70-90%, left 60-70%; asymptomatic --> moderately increased RICA velocities on 01/2014 dopplers.  . Aortic valve sclerosis 2004    Echo showed normal LV function, some mild diastolic dysfunction. Aortic sclerosis but no stenosis.  . Glaucoma   . History of heart attack 1960s    No WMA on Echo    Past Surgical History  Procedure Laterality Date  . Appendectomy    . Hiatal hernia repair    . Transthoracic echocardiogram  2004    LV EF normal, LA mildly dilated, arial septum is aneurysmal, mild aortic sclerosis, mild mitral annular calcification, mild MR, mild TR, RVSP 40-34mmHg (mod pulm HTN)  . Carotid doppler  2011; 2015    RICA 70-99% diameter reduction, LICA 50-69% diameter reduction, right RCA significantly elevated velocities;; moderately increased when compared to 2011.  Marland Kitchen Esophagogastroduodenoscopy N/A 12/06/2014    Procedure: ESOPHAGOGASTRODUODENOSCOPY (EGD);  Surgeon: Vertell Novak., MD;  Location: [facility] ENDOSCOPY;  Service: Endoscopy;  Laterality: N/A;      Allergies: Penicillins  Medications: Prior to Admission medications   Medication Sig Start Date End Date Taking? Authorizing Provider  acetaminophen (TYLENOL) 650 MG CR tablet Take 650 mg by mouth every 8 (eight) hours as needed for pain.   Yes Historical Provider, MD  alendronate (FOSAMAX) 70 MG tablet Take 70 mg by mouth once a week. Wednesday 04/05/14  Yes Historical Provider, MD  amLODipine (NORVASC) 5 MG tablet Take 5 mg by mouth daily.   Yes Historical Provider, MD  aspirin 81 MG EC tablet Take 81 mg by mouth daily. 03/26/14  Yes Historical Provider, MD  CALCIUM PO Take by mouth.   Yes Historical Provider, MD  chlorthalidone (HYGROTON) 25 MG tablet Take 25 mg by mouth daily.   Yes Historical Provider, MD  cholecalciferol (VITAMIN D) 1000 UNITS tablet Take 1,000 Units by mouth daily.   Yes Historical Provider, MD  levothyroxine (SYNTHROID, LEVOTHROID) 25 MCG tablet Take 25 mcg by mouth daily before breakfast.   Yes Historical Provider, MD  lovastatin (MEVACOR) 20 MG tablet Take 20 mg by mouth at bedtime.   Yes Historical Provider, MD  Multiple Vitamin (MULTIVITAMIN) tablet Take 1 tablet by mouth daily.   Yes Historical Provider, MD  naproxen sodium (ALEVE) 220 MG tablet Take 220 mg by mouth. 2 tablets in morning   Yes Historical Provider, MD  vitamin E 400 UNIT capsule Take 400 Units by mouth daily.   Yes Historical Provider, MD  amLODipine (NORVASC) 10 MG tablet Take 1 tablet (10 mg total) by mouth daily. Patient not taking: Reported on 12/05/2014  04/11/13   Marykay Lexavid W Harding, MD  chlorthalidone (HYGROTON) 25 MG tablet TAKE ONE TABLET BY MOUTH ONCE DAILY Patient not taking: Reported on 12/05/2014 08/19/14   Marykay Lexavid W Harding, MD  lovastatin (MEVACOR) 20 MG tablet TAKE ONE TABLET BY MOUTH AT BEDTIME Patient not taking: Reported on 12/05/2014 04/26/14   Marykay Lexavid W Harding, MD     Family History  Problem Relation Age of Onset  . Other      Noncontributory, deferred due to age  . Heart attack  Father     History   Social History  . Marital Status: Widowed    Spouse Name: N/A  . Number of Children: 6  . Years of Education: 14   Occupational History  .     Social History Main Topics  . Smoking status: Never Smoker   . Smokeless tobacco: Not on file  . Alcohol Use: No  . Drug Use: No  . Sexual Activity: Not on file   Other Topics Concern  . None   Social History Narrative   Widowed mother of 5 with one deceased child. Sheila Murphy has 3 of her children the spend significant time with her 2 living in her home. 23 grandchildren 18 great grandchildren. 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 As of now the stairs of alcohol house without any problems. 2 - 3 sons that are her caregivers providing her meals and transportation.    Review of Systems: A 12 point ROS discussed and pertinent positives are indicated in the HPI above.  All other systems are negative.  Review of Systems  Constitutional: Positive for activity change, appetite change and fatigue.  Respiratory: Positive for shortness of breath.   Gastrointestinal: Positive for nausea, constipation and blood in stool.  Skin: Positive for color change.  Neurological: Positive for dizziness and weakness.  Psychiatric/Behavioral: Negative for agitation.    Vital Signs: BP 60/20 mmHg  Pulse 80  Temp(Src) 98.9 F (37.2 C) (Axillary)  Resp 25  Ht 5' (1.524 m)  Wt 53.8 kg (118 lb 9.7 oz)  BMI 23.16 kg/m2  SpO2 100%  Physical Exam  Constitutional: Sheila Murphy appears well-developed.  Cardiovascular: Normal rate, regular rhythm and normal heart sounds.   No murmur heard. Pulmonary/Chest: Effort normal. Sheila Murphy has wheezes.  Abdominal: Soft. Bowel sounds are normal. There is no tenderness.  Musculoskeletal: Normal range of motion.  Neurological: Sheila Murphy is alert.  Skin: Skin is warm. There is pallor.  Psychiatric:  Consented Murphy in waiting room  Nursing note and vitals  reviewed.   Mallampati Score:  MD Evaluation Airway: WNL Heart: WNL Abdomen: WNL Chest/ Lungs: WNL Other Pertinent Findings: elderly but alert, SBP 90s but awake and able to answer question ASA  Classification: 3 Mallampati/Airway Score: Two  Imaging: No results found.  Labs:  CBC:  Recent Labs  12/07/14 0320 12/07/14 1900 12/08/14 0330 12/09/14 0312  WBC 9.9 10.7* 9.1 8.8  HGB 9.3* 6.3* 9.5* 6.2*  HCT 26.4* 17.9* 27.2* 18.1*  PLT 193 178 164 128*    COAGS:  Recent Labs  12/06/14 0415  INR 1.32  APTT 27    BMP:  Recent Labs  12/06/14 0415 12/07/14 0320 12/08/14 0330 12/09/14 0312  NA 141 139 142 142  K 2.5* 3.9 3.9 4.2  CL 108 113* 115* 119*  CO2 25 21 23 20   GLUCOSE 85 85 90 114*  BUN 56* 31* 35* 39*  CALCIUM 8.7 8.1* 8.3* 7.3*  CREATININE 1.60* 1.27* 1.30* 1.24*  GFRNONAA 27* 36* 35* 37*  GFRAA 32* 42* 41* 43*    LIVER FUNCTION TESTS:  Recent Labs  12/05/14 0933 12/06/14 0415 12/08/14 0330  BILITOT 0.9 1.0 0.5  AST ALT ALKPHOS 43 40 35*  PROT 5.3* 4.7* 4.5*  ALBUMIN 2.7* 2.6* 2.6*    TUMOR MARKERS: No results for input(s): AFPTM, CEA, CA199, CHROMGRNA in the last 8760 hours.  Assessment and Plan:  Bloody stools Endoscope reveals large actively bleeding duodenal ulcer Now scheduled for emergent mesenteric arteriogram with probable embolization Sheila Murphy aware of procedure benefits and risks including but not limited to Infection; bleeding; death; vessel damage Agreeable to proceed Consent signed and in chart  Thank you for this interesting consult.  I greatly enjoyed meeting Kacee Sukhu and look forward to participating in Sheila Murphy.  Signed: Larhonda Dettloff A 12/09/2014, 2:33 PM   I spent a total of 40 Minutes  in face to face in clinical consultation, greater than 50% of which was counseling/coordinating Murphy for mesenteric arteriogram with prob embolization

## 2014-12-09 NOTE — Consult Note (Signed)
PULMONARY / CRITICAL CARE MEDICINE   Name: Sheila Murphy MRN: [id number] DOB: 10/21/1921    ADMISSION DATE:  12/05/2014 CONSULTATION DATE:  12/09/14   REFERRING MD :  Dr Sharon Seller  CHIEF COMPLAINT:  Upper GIB, hemorrhagic shock  INITIAL PRESENTATION:  81 yo woman, admitted with UGIB and secondary AKI 2/18, found to have a large DU on EGD 2/19. She was treated with PPI and resuscitated. Unfortunately she continued to lose blood, worsening 2/21 pm. Repeat EGD 2/22 showed stomach full of blood - no intervention was possible. She went for IR embolization of GDA on 2/22. She transfers now to ICU for stabilization post-procedure. She received 8u PRBC, 2 prior to the procedure and 6 during embolization. She had periods of hypotension reported while in IR but is hemodynamically stable in the ICU currently.   STUDIES:  EGD 2/19 >> large duodenal ulcer EGD 2/22 >> stomach full of blood, visualization and intervention impossible Arteriogram 2/22 >> culprit vessel embolized via the GDA.   PAST MEDICAL HISTORY :   has a past medical history of HTN (hypertension); Dyslipidemia; Carotid artery disease without cerebral infarction; Aortic valve sclerosis (2004); Glaucoma; and History of heart attack (1960s).  has past surgical history that includes Appendectomy; Hiatal hernia repair; transthoracic echocardiogram (2004); CAROTID DOPPLER (2011; 2015); and Esophagogastroduodenoscopy (N/A, 12/06/2014). Prior to Admission medications   Medication Sig Start Date End Date Taking? Authorizing Provider  acetaminophen (TYLENOL) 650 MG CR tablet Take 650 mg by mouth every 8 (eight) hours as needed for pain.   Yes Historical Provider, MD  alendronate (FOSAMAX) 70 MG tablet Take 70 mg by mouth once a week. Wednesday 04/05/14  Yes Historical Provider, MD  amLODipine (NORVASC) 5 MG tablet Take 5 mg by mouth daily.   Yes Historical Provider, MD  aspirin 81 MG EC tablet Take 81 mg by mouth daily. 03/26/14  Yes Historical Provider,  MD  CALCIUM PO Take by mouth.   Yes Historical Provider, MD  chlorthalidone (HYGROTON) 25 MG tablet Take 25 mg by mouth daily.   Yes Historical Provider, MD  cholecalciferol (VITAMIN D) 1000 UNITS tablet Take 1,000 Units by mouth daily.   Yes Historical Provider, MD  levothyroxine (SYNTHROID, LEVOTHROID) 25 MCG tablet Take 25 mcg by mouth daily before breakfast.   Yes Historical Provider, MD  lovastatin (MEVACOR) 20 MG tablet Take 20 mg by mouth at bedtime.   Yes Historical Provider, MD  Multiple Vitamin (MULTIVITAMIN) tablet Take 1 tablet by mouth daily.   Yes Historical Provider, MD  naproxen sodium (ALEVE) 220 MG tablet Take 220 mg by mouth. 2 tablets in morning   Yes Historical Provider, MD  vitamin E 400 UNIT capsule Take 400 Units by mouth daily.   Yes Historical Provider, MD  amLODipine (NORVASC) 10 MG tablet Take 1 tablet (10 mg total) by mouth daily. Patient not taking: Reported on 12/05/2014 04/11/13   Marykay Lex, MD  chlorthalidone (HYGROTON) 25 MG tablet TAKE ONE TABLET BY MOUTH ONCE DAILY Patient not taking: Reported on 12/05/2014 08/19/14   Marykay Lex, MD  lovastatin (MEVACOR) 20 MG tablet TAKE ONE TABLET BY MOUTH AT BEDTIME Patient not taking: Reported on 12/05/2014 04/26/14   Marykay Lex, MD   Allergies  Allergen Reactions  . Penicillins     FAMILY HISTORY:  indicated that her mother is deceased. She indicated that her father is deceased.  SOCIAL HISTORY:  reports that she has never smoked. She does not have any smokeless tobacco history on file.  She reports that she does not drink alcohol or use illicit drugs.  REVIEW OF SYSTEMS:  Denies any abdominal pain  SUBJECTIVE: slightly confused about the events but does interact, answer questions about how she is feeling  VITAL SIGNS: Temp:  [96.9 F (36.1 C)-100.5 F (38.1 C)] 96.9 F (36.1 C) (02/22 1503) Pulse Rate:  [41-82] 41 (02/22 1800) Resp:  [13-28] 28 (02/22 1800) BP: (60-138)/(13-48) 60/20 mmHg (02/22  1325) SpO2:  [99 %-100 %] 100 % (02/22 1800) Arterial Line BP: (135)/(61) 135/61 mmHg (02/22 1800) Weight:  [53.8 kg (118 lb 9.7 oz)] 53.8 kg (118 lb 9.7 oz) (02/22 0303) HEMODYNAMICS:   VENTILATOR SETTINGS:   INTAKE / OUTPUT:  Intake/Output Summary (Last 24 hours) at 12/09/14 1828 Last data filed at 12/09/14 1640  Gross per 24 hour  Intake   4486 ml  Output    800 ml  Net   3686 ml    PHYSICAL EXAMINATION: General:  Elderly woman, laying flat, comfortable  Neuro:  Awake, alert, interacting, answers questions, moves extremities, confused about current illness HEENT:  OP dry Cardiovascular:  Regular, tachy, soft syst M  Lungs:  Distant, no crackles or wheezes Abdomen:  Old surgical scars, large midline ventral hernia, non-tender to palp Musculoskeletal:  No edema Skin:  No rash  LABS:  CBC  Recent Labs Lab 12/07/14 1900 12/08/14 0330 12/09/14 0312 12/09/14 1552 12/09/14 1558  WBC 10.7* 9.1 8.8  --   --   HGB 6.3* 9.5* 6.2* 7.1* 7.5*  HCT 17.9* 27.2* 18.1* 21.0* 22.0*  PLT 178 164 128*  --   --    Coag's  Recent Labs Lab 12/06/14 0415  APTT 27  INR 1.32   BMET  Recent Labs Lab 12/07/14 0320 12/08/14 0330 12/09/14 0312 12/09/14 1552 12/09/14 1558  NA 139 142 142 145 145  K 3.9 3.9 4.2 4.2 4.3  CL 113* 115* 119*  --  117*  CO2 --   --   BUN 31* 35* 39*  --  22  CREATININE 1.27* 1.30* 1.24*  --  1.00  GLUCOSE 85 90 114*  --  298*   Electrolytes  Recent Labs Lab 12/06/14 0415 12/07/14 0320 12/08/14 0330 12/09/14 0312  CALCIUM 8.7 8.1* 8.3* 7.3*  MG 1.9  --   --   --    Sepsis Markers No results for input(s): LATICACIDVEN, PROCALCITON, O2SATVEN in the last 168 hours. ABG  Recent Labs Lab 12/09/14 1552  PHART 7.282*  PCO2ART 20.0*  PO2ART 251.0*   Liver Enzymes  Recent Labs Lab 12/05/14 0933 12/06/14 0415 12/08/14 0330  AST ALT ALKPHOS 43 40 35*  BILITOT 0.9 1.0 0.5  ALBUMIN 2.7* 2.6* 2.6*    Cardiac Enzymes No results for input(s): TROPONINI, PROBNP in the last 168 hours. Glucose  Recent Labs Lab 12/09/14 1709  GLUCAP 127*    Imaging No results found.   ASSESSMENT / PLAN:  PULMONARY A: No acute issues, at risk for TRALI given blood products P:   - check CXR am   CARDIOVASCULAR A: Hemorrhagic shock, currently stable post-IR  Hx HTN Hx AV sclerosis Hx Hyperlipidemia P:  - hold home BP regimen, restart when stabilized - no ASA at this time  RENAL A:  AKI likely ATN due hemorrhagic shock, improved but at risk for further decline given hypotension in IR 2/22 P:   - follow BMP closely  GASTROINTESTINAL A:  Acute Upper  GIB due to DU, s/p GDA embolization 2/22 Large ventral hernia P:   - will revert back to pantoprazole gtt 2/22 given her acute decline, transition to BID dosing in next few days.  - avoid NSAIDS, etc  HEMATOLOGIC A:  Acute blood loss anemia P:  - will follow serial CBC - transfusion goal Hgb > 8.0  INFECTIOUS A: E coli UTI P:   UC 2/18 >> E coli (R to amp, otherwise sensitive) C diff negative 2/18 Ceftriaxone 2/19 >> 2/21 ceftin 2/21 >>   Abx day 5 / 7  ENDOCRINE A:  Hypothyroidism    P:   - change synthroid to IV until safe to take PO  NEUROLOGIC A:  Intermittent encephalopathy, ICU delirium P:   RASS goal: 0 - follow MS, careful with mind altering meds - haldol held   FAMILY  - Updates: no family available at this time 2/22  - Inter-disciplinary family meet or Palliative Care meeting due by:  2/29  TODAY'S SUMMARY:     Levy Pupaobert Ashiah Karpowicz, MD, PhD 12/09/2014, 7:04 PM  Pulmonary and Critical Care [phone] or if no answer [phone]

## 2014-12-09 NOTE — Progress Notes (Addendum)
eLink Physician-Brief Progress Note Patient Name: Sheila GrateLavelle Murphy DOB: [birth year] MRN: [id number]   Date of Service  12/09/2014  HPI/Events of Note  Duodenal ulcer , s/p embolisation of GDA, last Hb 7.5 @4pm   eICU Interventions  CBC q 6h  Haldol prn agitation Dc amlodipin protonix 40 q 12h   New ICU patient evaluation: [facility] I have reviewed relevant documentation including care plan & orders.   Intervention Category Evaluation Type: New Patient Evaluation  ALVA,RAKESH V. 12/09/2014, 5:50 PM

## 2014-12-09 NOTE — Anesthesia Preprocedure Evaluation (Addendum)
Anesthesia Evaluation  Patient identified by MRN, date of birth, ID band Patient awake    Reviewed: Allergy & Precautions, NPO status , Patient's Chart, lab work & pertinent test results, reviewed documented beta blocker date and time   History of Anesthesia Complications (+) AWARENESS UNDER ANESTHESIA and MALIGNANT HYPERTHERMIA  Airway        Dental   Pulmonary neg pulmonary ROS,          Cardiovascular hypertension, Pt. on medications  ECHO 2004 good LV function, minimal aortic valve sclerosis   Neuro/Psych Right carotid 79% stenosis, Left 59%, both asymptomatic negative neurological ROS     GI/Hepatic negative GI ROS, Neg liver ROS, Been taking NSAID   Endo/Other    Renal/GU      Musculoskeletal   Abdominal   Peds  Hematology  (+) anemia , H/H 6/18, has been receiving RBC this AM   Anesthesia Other Findings   Reproductive/Obstetrics                           Anesthesia Physical Anesthesia Plan  ASA: III  Anesthesia Plan: MAC   Post-op Pain Management:    Induction: Intravenous  Airway Management Planned: Nasal Cannula  Additional Equipment:   Intra-op Plan:   Post-operative Plan:   Informed Consent: I have reviewed the patients History and Physical, chart, labs and discussed the procedure including the risks, benefits and alternatives for the proposed anesthesia with the patient or authorized representative who has indicated his/her understanding and acceptance.     Plan Discussed with:   Anesthesia Plan Comments:         Anesthesia Quick Evaluation

## 2014-12-09 NOTE — Transfer of Care (Signed)
Immediate Anesthesia Transfer of Care Note  Patient: Sheila Murphy  Procedure(s) Performed: Procedure(s): RADIOLOGY WITH ANESTHESIA (N/A)  Patient Location: ICU  Anesthesia Type:MAC  Level of Consciousness: awake, alert , oriented and patient cooperative  Airway & Oxygen Therapy: Patient Spontanous Breathing and Patient connected to face mask oxygen  Post-op Assessment: Report given to RN and Post -op Vital signs reviewed and stable  Post vital signs: Reviewed  Last Vitals:  Filed Vitals:   12/09/14 1503  BP:   Pulse:   Temp: 36.1 C  Resp:     Complications: No apparent anesthesia complications

## 2014-12-09 NOTE — Anesthesia Procedure Notes (Signed)
Procedure Name: MAC Date/Time: 12/09/2014 3:35 PM Performed by: Lovie CholOCK, [facility] Pre-anesthesia Checklist: Patient identified, Emergency Drugs available, Suction available, Patient being monitored and Timeout performed Patient Re-evaluated:Patient Re-evaluated prior to inductionOxygen Delivery Method: Simple face mask

## 2014-12-09 NOTE — Anesthesia Postprocedure Evaluation (Signed)
  Anesthesia Post-op Note  Patient: [name]  Procedure(s) Performed: Procedure(s): ESOPHAGOGASTRODUODENOSCOPY (EGD) (N/A)  Patient Location: Endoscopy Unit  Anesthesia Type:MAC  Level of Consciousness: awake, confused and lethargic  Airway and Oxygen Therapy: Patient Spontanous Breathing and Patient connected to nasal cannula oxygen  Post-op Pain: none  Post-op Assessment: Post-op Vital signs reviewed, PATIENT'S CARDIOVASCULAR STATUS UNSTABLE, Respiratory Function Stable, Patent Airway and No signs of Nausea or vomiting  Post-op Vital Signs: unstable  Last Vitals:  Filed Vitals:   12/09/14 1800  BP:   Pulse: 41  Temp:   Resp: 28    Complications: No apparent anesthesia complications

## 2014-12-09 NOTE — Progress Notes (Signed)
  [place] TEAM 1 - Stepdown/ICU TEAM  Pt was noted to have ongoing melena due to a large duodenal ulcer, with serial CBCs revealing slowly declining Hgb.  Over the last 24 hours, however, her Hgb decline increased markedly, with a 3g drop from 2/21 AM to 2/22 AM.  As a result, GI took her to repeat endoscopy early this afternoon.  On EGD the pt was noted to have so much blood in the stomach, pylorus, and duodenal bulb that endoscopic tx was not possible.  As a result, she was taken directly to IR where she underwent a mesenteric arteriogram and embolization of the GDA and a hypertrophied pancreaticoduodenal branch.  Gen Surg was also consulted by GI, and has followed her progress today.    Given the serious nature of her re-bleeding, she has been transferred to the ICU directly from Radiology for close hemodynamic monitoring.  I have alerted PCCM who will assume her care.  GI has been monitoring her care th/o the hospitalization, and will cont to do so.    Lonia BloodJeffrey T. Taz Vanness, MD [facility] For Consults/Admissions - Flow Manager [phone] Office  [phone]  Contact MD directly via text page:      [email or web address]      password [email or web address]

## 2014-12-09 NOTE — Transfer of Care (Signed)
Immediate Anesthesia Transfer of Care Note  Patient: Dorette GrateLavelle Kimm  Procedure(s) Performed: Procedure(s): ESOPHAGOGASTRODUODENOSCOPY (EGD) (N/A)  Patient Location: PACU and Radiology  Anesthesia Type:MAC  Level of Consciousness: awake, alert  and oriented  Airway & Oxygen Therapy: Patient connected to face mask oxygen  Post-op Assessment: Post -op Vital signs reviewed and stable  Post vital signs: stable  Last Vitals:  Filed Vitals:   12/09/14 1503  BP:   Pulse:   Temp: 36.1 C  Resp:     Complications: No apparent anesthesia complications

## 2014-12-09 NOTE — H&P (View-Only) (Signed)
[facility] Progress Note  Subjective: Patient seen today for the first time. She has a large duodenal ulcer per recent endoscopy a few days ago. She has had a 3 g drop in her hemoglobin overnight. She is passing dark and reddish stools. She ate a full liquid diet for breakfast this morning. She finished about 8:15. She denied vomiting.  Objective: Vital signs in last 24 hours: Temp:  [98.2 F (36.8 C)-100.5 F (38.1 C)] 98.6 F (37 C) (02/22 0915) Pulse Rate:  [64-76] 76 (02/22 0915) Resp:  [13-25] 24 (02/22 0915) BP: (100-127)/(27-59) 115/36 mmHg (02/22 0915) SpO2:  [100 %] 100 % (02/22 0915) Weight:  [53.8 kg (118 lb 9.7 oz)] 53.8 kg (118 lb 9.7 oz) (02/22 0303) Weight change:    PE:  She is in no distress  Heart regular rhythm  Lungs clear  Abdomen: Soft and nontender    Lab Results: Results for orders placed or performed during the hospital encounter of 12/05/14 (from the past 24 hour(s))  CBC     Status: Abnormal   Collection Time: 12/09/14  3:12 AM  Result Value Ref Range   WBC 8.8 4.0 - 10.5 K/uL   RBC 2.08 (L) 3.87 - 5.11 MIL/uL   Hemoglobin 6.2 (LL) 12.0 - 15.0 g/dL   HCT 18.1 (L) 36.0 - 46.0 %   MCV 87.0 78.0 - 100.0 fL   MCH 29.8 26.0 - 34.0 pg   MCHC 34.3 30.0 - 36.0 g/dL   RDW 14.8 11.5 - 15.5 %   Platelets 128 (L) 150 - 400 K/uL  Basic metabolic panel     Status: Abnormal   Collection Time: 12/09/14  3:12 AM  Result Value Ref Range   Sodium 142 135 - 145 mmol/L   Potassium 4.2 3.5 - 5.1 mmol/L   Chloride 119 (H) 96 - 112 mmol/L   CO2 20 19 - 32 mmol/L   Glucose, Bld 114 (H) 70 - 99 mg/dL   BUN 39 (H) 6 - 23 mg/dL   Creatinine, Ser 1.24 (H) 0.50 - 1.10 mg/dL   Calcium 7.3 (L) 8.4 - 10.5 mg/dL   GFR calc non Af Amer 37 (L) >90 mL/min   GFR calc Af Amer 43 (L) >90 mL/min   Anion gap 3 (L) 5 - 15  Prepare RBC     Status: None   Collection Time: 12/09/14  6:12 AM  Result Value Ref Range   Order Confirmation ORDER PROCESSED BY BLOOD  BANK   Type and screen     Status: None (Preliminary result)   Collection Time: 12/09/14  6:12 AM  Result Value Ref Range   ABO/RH(D) O POS    Antibody Screen NEG    Sample Expiration 12/12/2014    Unit Number [id number]    Blood Component Type RED CELLS,LR    Unit division 00    Status of Unit ISSUED    Transfusion Status OK TO TRANSFUSE    Crossmatch Result Compatible    Unit Number [id number]    Blood Component Type RED CELLS,LR    Unit division 00    Status of Unit ALLOCATED    Transfusion Status OK TO TRANSFUSE    Crossmatch Result Compatible     Studies/Results: No results found.    Assessment: GI bleed secondary to large duodenal ulcer. It appears that she is still bleeding.  Plan: Repeat EGD. This was discussed with patient. We will see if anything can be done therapeutically. If not and she continues   to bleed we should ask interventional radiology to get involved to see if they can do arteriogram and embolization. She is receiving 2 units of blood this morning.    Cathey Fredenburg F 12/09/2014, 9:35 AM  Lab Results  Component Value Date   HGB 6.2* 12/09/2014   HGB 9.5* 12/08/2014   HGB 6.3* 12/07/2014   HCT 18.1* 12/09/2014   HCT 27.2* 12/08/2014   HCT 17.9* 12/07/2014   ALKPHOS 35* 12/08/2014   ALKPHOS 40 12/06/2014   ALKPHOS 43 12/05/2014   AST 25 12/08/2014   AST 27 12/06/2014   AST 30 12/05/2014   ALT 14 12/08/2014   ALT 13 12/06/2014   ALT 12 12/05/2014    

## 2014-12-09 NOTE — Progress Notes (Signed)
eLink Physician-Brief Progress Note Patient Name: Sheila Murphy DOB: 18-Nov-1921 MRN: [id number]   Date of Service  12/09/2014  HPI/Events of Note  NAg acidosis hypocalcemia  eICU Interventions  Bicarb gtt @ 50 1g Calcium     Intervention Category Major Interventions: Acid-Base disturbance - evaluation and management Intermediate Interventions: Electrolyte abnormality - evaluation and management  Tayna Smethurst V. 12/09/2014, 10:48 PM

## 2014-12-09 NOTE — Progress Notes (Signed)
Patient ID: Sheila Murphy, female   DOB: [birth year], 81 y.o.   MRN: [id number]  I spoke with the patient's son.  Did not examine the patient as she was on her way to IR.  Formal consult note to follow after the patient is examined.   The patient is a full code.  At present time, her son wants everything done including surgery if necessary. Hopefully bleeding can be stopped by embolization.  Please call below for urgent needs or AMION if after hours.   Ashok NorrisEmina Devyne Hauger, [facility]th[facility] SpartaNP-BC [facility] For consults and floor pages call [phone](7A-4:30P)

## 2014-12-09 NOTE — Consult Note (Signed)
Reason for Consult: bleeding duodenal ulcer  Referring Physician: Dr. Arta Silence   HPI: Sheila Murphy is a 81 year old female with a history of AVS, hypothyroidism, HTN, carotid artery disease admitted on 12/05/14 with melena and weakness.  She was found to have a hgb of 6.7, normotensive, +FOBT at this time.  She was transferred with 2u of pRBCs and started on protonix.  She then had a endoscopy which was negative for active bleeding, showed a large ulcer in the duodenal bulb.  She received additional units on the 20th for a drop in hemoglobin.  Overnight last night, hemoglobin dropped again to 6.2.  A repeat endoscopy today showed a large bleeding duodenal ulcer, interventional radiology was consulted and the patient is in route to IR for angio embolization.  The patient is hypotensive.  She is currently not on any pressors.   We have been consulted for possible surgery if IR is unsuccessful.  I was able to obtain some information from Sheila Murphy, the patients son.  Apparently the patient has been taking 3 Aleve's per day and a $Rem'81mg'oTBl$  aspirin.  He denies previous abdominal surgeries.  She is followed by Dr. Ellyn Murphy for aortic valve sclerosis.  He son reports 3 MIs in the past, but none in recent years.    Past Medical History  Diagnosis Date  . HTN (hypertension)   . Dyslipidemia     On Mevacor - followed by PCP  . Carotid artery disease without cerebral infarction     Right internal carotid: 70-90%, left 60-70%; asymptomatic --> moderately increased RICA velocities on 01/2014 dopplers.  . Aortic valve sclerosis 2004    Echo showed normal LV function, some mild diastolic dysfunction. Aortic sclerosis but no stenosis.  . Glaucoma   . History of heart attack 1960s    No WMA on Echo    Past Surgical History  Procedure Laterality Date  . Appendectomy    . Hiatal hernia repair    . Transthoracic echocardiogram  2004    LV EF normal, LA mildly dilated, arial septum is aneurysmal, mild aortic  sclerosis, mild mitral annular calcification, mild MR, mild TR, RVSP 40-62mmHg (mod pulm HTN)  . Carotid doppler  2011; 2015    RICA 70-99% diameter reduction, LICA 97-98% diameter reduction, right RCA significantly elevated velocities;; moderately increased when compared to 2011.  Marland Kitchen Esophagogastroduodenoscopy N/A 12/06/2014    Procedure: ESOPHAGOGASTRODUODENOSCOPY (EGD);  Surgeon: Winfield Cunas., MD;  Location[facility]COPY;  Service: Endoscopy;  Laterality: N/A;    Family History  Problem Relation Age of Onset  . Other      Noncontributory, deferred due to age  . Heart attack Father     Social History:  reports that she has never smoked. She does not have any smokeless tobacco history on file. She reports that she does not drink alcohol or use illicit drugs.  Allergies:  Allergies  Allergen Reactions  . Penicillins     Medications:  Scheduled Meds: . sodium chloride   Intravenous Once  . [MAR Hold] amLODipine  5 mg Oral Daily  . [MAR Hold] cefUROXime  500 mg Oral BID WC  . [MAR Hold] levothyroxine  25 mcg Oral QAC breakfast  . lidocaine      . [MAR Hold] pantoprazole  40 mg Oral BID  . sodium chloride  500 mL Intravenous Once  . [MAR Hold] sodium chloride  3 mL Intravenous Q12H   Continuous Infusions: . sodium chloride 50 mL/hr at 12/08/14 1156  .  sodium chloride     PRN Meds:.[MAR Hold] acetaminophen, fentaNYL, meperidine (DEMEROL) injection, [MAR Hold]  morphine injection, [MAR Hold] ondansetron (ZOFRAN) IV, promethazine   Results for orders placed or performed during the hospital encounter of 12/05/14 (from the past 48 hour(s))  CBC     Status: Abnormal   Collection Time: 12/07/14  7:00 PM  Result Value Ref Range   WBC 10.7 (H) 4.0 - 10.5 K/uL    Comment: WHITE COUNT CONFIRMED ON SMEAR   RBC 2.04 (L) 3.87 - 5.11 MIL/uL   Hemoglobin 6.3 (LL) 12.0 - 15.0 g/dL    Comment: REPEATED TO VERIFY DELTA CHECK NOTED CRITICAL RESULT CALLED TO, READ BACK BY AND VERIFIED  WITH: GARCIAK RN 2000 12/07/14 MANESSL    HCT 17.9 (L) 36.0 - 46.0 %   MCV 87.7 78.0 - 100.0 fL   MCH 30.9 26.0 - 34.0 pg   MCHC 35.2 30.0 - 36.0 g/dL   RDW 14.5 11.5 - 15.5 %   Platelets 178 150 - 400 K/uL  Prepare RBC     Status: None   Collection Time: 12/07/14  9:00 PM  Result Value Ref Range   Order Confirmation ORDER PROCESSED BY BLOOD BANK   CBC     Status: Abnormal   Collection Time: 12/08/14  3:30 AM  Result Value Ref Range   WBC 9.1 4.0 - 10.5 K/uL   RBC 3.13 (L) 3.87 - 5.11 MIL/uL   Hemoglobin 9.5 (L) 12.0 - 15.0 g/dL    Comment: REPEATED TO VERIFY   HCT 27.2 (L) 36.0 - 46.0 %   MCV 86.9 78.0 - 100.0 fL   MCH 30.4 26.0 - 34.0 pg   MCHC 34.9 30.0 - 36.0 g/dL   RDW 14.0 11.5 - 15.5 %   Platelets 164 150 - 400 K/uL  Comprehensive metabolic panel     Status: Abnormal   Collection Time: 12/08/14  3:30 AM  Result Value Ref Range   Sodium 142 135 - 145 mmol/L   Potassium 3.9 3.5 - 5.1 mmol/L   Chloride 115 (H) 96 - 112 mmol/L   CO2 23 19 - 32 mmol/L   Glucose, Bld 90 70 - 99 mg/dL   BUN 35 (H) 6 - 23 mg/dL   Creatinine, Ser 1.30 (H) 0.50 - 1.10 mg/dL   Calcium 8.3 (L) 8.4 - 10.5 mg/dL   Total Protein 4.5 (L) 6.0 - 8.3 g/dL   Albumin 2.6 (L) 3.5 - 5.2 g/dL   AST 25 0 - 37 U/L   ALT 14 0 - 35 U/L   Alkaline Phosphatase 35 (L) 39 - 117 U/L   Total Bilirubin 0.5 0.3 - 1.2 mg/dL   GFR calc non Af Amer 35 (L) >90 mL/min   GFR calc Af Amer 41 (L) >90 mL/min    Comment: (NOTE) The eGFR has been calculated using the CKD EPI equation. This calculation has not been validated in all clinical situations. eGFR's persistently <90 mL/min signify possible Chronic Kidney Disease.    Anion gap 4 (L) 5 - 15  CBC     Status: Abnormal   Collection Time: 12/09/14  3:12 AM  Result Value Ref Range   WBC 8.8 4.0 - 10.5 K/uL   RBC 2.08 (L) 3.87 - 5.11 MIL/uL   Hemoglobin 6.2 (LL) 12.0 - 15.0 g/dL    Comment: REPEATED TO VERIFY CRITICAL RESULT CALLED TO, READ BACK BY AND VERIFIED  WITH: A. KNEE RN [phone] GREEN R    HCT  18.1 (L) 36.0 - 46.0 %   MCV 87.0 78.0 - 100.0 fL   MCH 29.8 26.0 - 34.0 pg   MCHC 34.3 30.0 - 36.0 g/dL   RDW 99.2 41.5 - 51.6 %   Platelets 128 (L) 150 - 400 K/uL  Basic metabolic panel     Status: Abnormal   Collection Time: 12/09/14  3:12 AM  Result Value Ref Range   Sodium 142 135 - 145 mmol/L   Potassium 4.2 3.5 - 5.1 mmol/L   Chloride 119 (H) 96 - 112 mmol/L   CO2 20 19 - 32 mmol/L   Glucose, Bld 114 (H) 70 - 99 mg/dL   BUN 39 (H) 6 - 23 mg/dL   Creatinine, Ser 1.44 (H) 0.50 - 1.10 mg/dL   Calcium 7.3 (L) 8.4 - 10.5 mg/dL   GFR calc non Af Amer 37 (L) >90 mL/min   GFR calc Af Amer 43 (L) >90 mL/min    Comment: (NOTE) The eGFR has been calculated using the CKD EPI equation. This calculation has not been validated in all clinical situations. eGFR's persistently <90 mL/min signify possible Chronic Kidney Disease.    Anion gap 3 (L) 5 - 15  Prepare RBC     Status: None   Collection Time: 12/09/14  6:12 AM  Result Value Ref Range   Order Confirmation ORDER PROCESSED BY BLOOD BANK   Type and screen     Status: None (Preliminary result)   Collection Time: 12/09/14  6:12 AM  Result Value Ref Range   ABO/RH(D) O POS    Antibody Screen NEG    Sample Expiration 12/12/2014    Unit Number [id number]    Blood Component Type RED CELLS,LR    Unit division 00    Status of Unit ISSUED    Transfusion Status OK TO TRANSFUSE    Crossmatch Result Compatible    Unit Number [id number]    Blood Component Type RED CELLS,LR    Unit division 00    Status of Unit ISSUED    Transfusion Status OK TO TRANSFUSE    Crossmatch Result Compatible    Unit Number [id number]    Blood Component Type RBC LR PHER2    Unit division 00    Status of Unit ISSUED    Transfusion Status OK TO TRANSFUSE    Crossmatch Result Compatible    Unit Number [id number]    Blood Component Type RED CELLS,LR    Unit division 00    Status of Unit ISSUED     Transfusion Status OK TO TRANSFUSE    Crossmatch Result Compatible    Unit Number [id number]    Blood Component Type RBC CPDA1, LR    Unit division 00    Status of Unit ISSUED    Transfusion Status OK TO TRANSFUSE    Crossmatch Result Compatible    Unit Number [id number]    Blood Component Type RBC CPDA1, LR    Unit division 00    Status of Unit ISSUED    Transfusion Status OK TO TRANSFUSE    Crossmatch Result Compatible    Unit Number [id number]    Blood Component Type RBC LR PHER2    Unit division 00    Status of Unit ALLOCATED    Transfusion Status OK TO TRANSFUSE    Crossmatch Result Compatible    Unit Number [id number]    Blood Component Type RED CELLS,LR    Unit division 00    Status of Unit ALLOCATED  Transfusion Status OK TO TRANSFUSE    Crossmatch Result Compatible    Unit Number [id number]    Blood Component Type RBC CPDA1, LR    Unit division 00    Status of Unit ISSUED    Transfusion Status OK TO TRANSFUSE    Crossmatch Result Compatible    Unit Number [id number]    Blood Component Type RBC CPDA1, LR    Unit division 00    Status of Unit ALLOCATED    Transfusion Status OK TO TRANSFUSE    Crossmatch Result Compatible    Unit Number [id number]    Blood Component Type RED CELLS,LR    Unit division 00    Status of Unit ALLOCATED    Transfusion Status OK TO TRANSFUSE    Crossmatch Result Compatible   Prepare RBC     Status: None   Collection Time: 12/09/14  1:36 PM  Result Value Ref Range   Order Confirmation ORDER PROCESSED BY BLOOD BANK     No results found.  Review of Systems  Unable to perform ROS Constitutional: Negative for fever.  Gastrointestinal: Positive for blood in stool. Negative for abdominal pain.  Psychiatric/Behavioral: The patient has insomnia.    Blood pressure 60/20, pulse 80, temperature 97.2 F (36.2 C), temperature source Axillary, resp. rate 25, height 5' (1.524 m), weight 118 lb 9.7 oz (53.8 kg), SpO2  100 %. Physical Exam  Vitals reviewed. Constitutional: She appears well-nourished.  HENT:  Head: Normocephalic and atraumatic.  Right Ear: Decreased hearing is noted.  Left Ear: Decreased hearing is noted.  Eyes: Conjunctivae are normal. Pupils are equal, round, and reactive to light.  Neck: Normal range of motion. Neck supple.  Respiratory: Effort normal and breath sounds normal.  GI: There is no tenderness. There is no rebound.    Musculoskeletal: Normal range of motion.  Neurological: She is alert.    Assessment/Plan: Bleeding duodenal ulcer Anemia  The patient has been hemodynamically stable since coming from IR where the gastroduodenal artery was embolized.  With her abdominal hernia she would be a nightmare to explore if she should rebleed.  We will follow.  Erby Pian 12/09/2014, 3:00 PM   Kathryne Eriksson. Dahlia Bailiff, MD, [place] [phone] [facility]

## 2014-12-09 NOTE — Interval H&P Note (Signed)
History and Physical Interval Note:  12/09/2014 1:50 PM  Sheila Murphy  has presented today for surgery, with the diagnosis of gi bleed, duodenal ulcer  The various methods of treatment have been discussed with the patient and family. After consideration of risks, benefits and other options for treatment, the patient has consented to  Procedure(s): ESOPHAGOGASTRODUODENOSCOPY (EGD) (N/A) as a surgical intervention .  The patient's history has been reviewed, patient examined, no change in status, stable for surgery.  I have reviewed the patient's chart and labs.  Questions were answered to the patient's satisfaction.     Sheila Murphy  Assessment:  1.  Ongoing bleeding.  2.  Known recent gastric and duodenal ulcer  Plan:  1.  Endoscopy. 2.  Risks (bleeding, infection, bowel perforation that could require surgery, sedation-related changes in cardiopulmonary systems), benefits (identification and possible treatment of source of symptoms, exclusion of certain causes of symptoms), and alternatives (watchful waiting, radiographic imaging studies, empiric medical treatment) of upper endoscopy (EGD) were explained to patient/family in detail and patient wishes to proceed.

## 2014-12-09 NOTE — Progress Notes (Signed)
PT Cancellation Note  Patient Details Name: Sheila GrateLavelle Murphy MRN: [id number] DOB: Jan 30, 1922   Cancelled Treatment:    Reason Eval/Treat Not Completed: Medical issues which prohibited therapy (low Hgb 6.2, will await initiation of transfusion)   Fabio AsaWerner, Anthonyjames Bargar J 12/09/2014, 8:36 AM [name], PT DPT  [phone]

## 2014-12-09 NOTE — Progress Notes (Signed)
PT Cancellation Note  Patient Details Name: Sheila Murphy MRN: [id number] DOB: 06-02-1922   Cancelled Treatment:    Reason Eval/Treat Not Completed: Medical issues which prohibited therapy (low Hgb 6.2) spoke with MD, will hold today   Fabio AsaWerner, Mata Rowen J 12/09/2014, 10:35 AM Sheila Crumbevon Jovontae Murphy, PT DPT  [phone]

## 2014-12-09 NOTE — Procedures (Signed)
Post mesenteric arteriogram and embolization of the GDA and a hypertrophied pancreaticoduodenal branch for active upper GI bleeding.  No immediate complications.  Keep right leg straight for 4 hrs.

## 2014-12-09 NOTE — Progress Notes (Signed)
[facility] Progress Note  Subjective: Patient seen today for the first time. She has a large duodenal ulcer per recent endoscopy a few days ago. She has had a 3 g drop in her hemoglobin overnight. She is passing dark and reddish stools. She ate a full liquid diet for breakfast this morning. She finished about 8:15. She denied vomiting.  Objective: Vital signs in last 24 hours: Temp:  [98.2 F (36.8 C)-100.5 F (38.1 C)] 98.6 F (37 C) (02/22 0915) Pulse Rate:  [64-76] 76 (02/22 0915) Resp:  [13-25] 24 (02/22 0915) BP: (100-127)/(27-59) 115/36 mmHg (02/22 0915) SpO2:  [100 %] 100 % (02/22 0915) Weight:  [53.8 kg (118 lb 9.7 oz)] 53.8 kg (118 lb 9.7 oz) (02/22 0303) Weight change:    PE:  She is in no distress  Heart regular rhythm  Lungs clear  Abdomen: Soft and nontender    Lab Results: Results for orders placed or performed during the hospital encounter of 12/05/14 (from the past 24 hour(s))  CBC     Status: Abnormal   Collection Time: 12/09/14  3:12 AM  Result Value Ref Range   WBC 8.8 4.0 - 10.5 K/uL   RBC 2.08 (L) 3.87 - 5.11 MIL/uL   Hemoglobin 6.2 (LL) 12.0 - 15.0 g/dL   HCT 16.118.1 (L) 09.636.0 - 04.546.0 %   MCV 87.0 78.0 - 100.0 fL   MCH 29.8 26.0 - 34.0 pg   MCHC 34.3 30.0 - 36.0 g/dL   RDW 40.914.8 81.111.5 - 91.415.5 %   Platelets 128 (L) 150 - 400 K/uL  Basic metabolic panel     Status: Abnormal   Collection Time: 12/09/14  3:12 AM  Result Value Ref Range   Sodium 142 135 - 145 mmol/L   Potassium 4.2 3.5 - 5.1 mmol/L   Chloride 119 (H) 96 - 112 mmol/L   CO2 20 19 - 32 mmol/L   Glucose, Bld 114 (H) 70 - 99 mg/dL   BUN 39 (H) 6 - 23 mg/dL   Creatinine, Ser 7.821.24 (H) 0.50 - 1.10 mg/dL   Calcium 7.3 (L) 8.4 - 10.5 mg/dL   GFR calc non Af Amer 37 (L) >90 mL/min   GFR calc Af Amer 43 (L) >90 mL/min   Anion gap 3 (L) 5 - 15  Prepare RBC     Status: None   Collection Time: 12/09/14  6:12 AM  Result Value Ref Range   Order Confirmation ORDER PROCESSED BY BLOOD  BANK   Type and screen     Status: None (Preliminary result)   Collection Time: 12/09/14  6:12 AM  Result Value Ref Range   ABO/RH(D) O POS    Antibody Screen NEG    Sample Expiration 12/12/2014    Unit Number [id number]    Blood Component Type RED CELLS,LR    Unit division 00    Status of Unit ISSUED    Transfusion Status OK TO TRANSFUSE    Crossmatch Result Compatible    Unit Number [id number]    Blood Component Type RED CELLS,LR    Unit division 00    Status of Unit ALLOCATED    Transfusion Status OK TO TRANSFUSE    Crossmatch Result Compatible     Studies/Results: No results found.    Assessment: GI bleed secondary to large duodenal ulcer. It appears that she is still bleeding.  Plan: Repeat EGD. This was discussed with patient. We will see if anything can be done therapeutically. If not and she continues  to bleed we should ask interventional radiology to get involved to see if they can do arteriogram and embolization. She is receiving 2 units of blood this morning.    Graylin Shiver 12/09/2014, 9:35 AM  Lab Results  Component Value Date   HGB 6.2* 12/09/2014   HGB 9.5* 12/08/2014   HGB 6.3* 12/07/2014   HCT 18.1* 12/09/2014   HCT 27.2* 12/08/2014   HCT 17.9* 12/07/2014   ALKPHOS 35* 12/08/2014   ALKPHOS 40 12/06/2014   ALKPHOS 43 12/05/2014   AST 25 12/08/2014   AST 27 12/06/2014   AST 30 12/05/2014   ALT 14 12/08/2014   ALT 13 12/06/2014   ALT 12 12/05/2014

## 2014-12-10 ENCOUNTER — Encounter (HOSPITAL_COMMUNITY): Payer: Self-pay | Admitting: *Deleted

## 2014-12-10 DIAGNOSIS — K254 Chronic or unspecified gastric ulcer with hemorrhage: Secondary | ICD-10-CM

## 2014-12-10 DIAGNOSIS — I358 Other nonrheumatic aortic valve disorders: Secondary | ICD-10-CM

## 2014-12-10 LAB — URINALYSIS, ROUTINE W REFLEX MICROSCOPIC
GLUCOSE, UA: NEGATIVE mg/dL
KETONES UR: NEGATIVE mg/dL
Nitrite: NEGATIVE
Protein, ur: 30 mg/dL — AB
Specific Gravity, Urine: 1.044 — ABNORMAL HIGH (ref 1.005–1.030)
Urobilinogen, UA: 0.2 mg/dL (ref 0.0–1.0)
pH: 5 (ref 5.0–8.0)

## 2014-12-10 LAB — CBC
HCT: 46.2 % — ABNORMAL HIGH (ref 36.0–46.0)
HCT: 41.1 % (ref 36.0–46.0)
HCT: 37.1 % (ref 36.0–46.0)
Hemoglobin: 16.6 g/dL — ABNORMAL HIGH (ref 12.0–15.0)
Hemoglobin: 14.8 g/dL (ref 12.0–15.0)
Hemoglobin: 13.2 g/dL (ref 12.0–15.0)
MCH: 29.9 pg (ref 26.0–34.0)
MCH: 29.1 pg (ref 26.0–34.0)
MCH: 28.9 pg (ref 26.0–34.0)
MCHC: 35.9 g/dL (ref 30.0–36.0)
MCHC: 36 g/dL (ref 30.0–36.0)
MCHC: 35.6 g/dL (ref 30.0–36.0)
MCV: 83.2 fL (ref 78.0–100.0)
MCV: 80.7 fL (ref 78.0–100.0)
MCV: 81.4 fL (ref 78.0–100.0)
PLATELETS: 19 10*3/uL — AB (ref 150–400)
PLATELETS: 39 10*3/uL — AB (ref 150–400)
PLATELETS: 51 10*3/uL — AB (ref 150–400)
RBC: 5.55 MIL/uL — AB (ref 3.87–5.11)
RBC: 5.09 MIL/uL (ref 3.87–5.11)
RBC: 4.56 MIL/uL (ref 3.87–5.11)
RDW: 15.4 % (ref 11.5–15.5)
RDW: 15.4 % (ref 11.5–15.5)
RDW: 15.6 % — AB (ref 11.5–15.5)
WBC: 24.9 10*3/uL — ABNORMAL HIGH (ref 4.0–10.5)
WBC: 22.3 10*3/uL — ABNORMAL HIGH (ref 4.0–10.5)
WBC: 20.8 10*3/uL — ABNORMAL HIGH (ref 4.0–10.5)

## 2014-12-10 LAB — BASIC METABOLIC PANEL
ANION GAP: 6 (ref 5–15)
Anion gap: 7 (ref 5–15)
Anion gap: 7 (ref 5–15)
BUN: 35 mg/dL — AB (ref 6–23)
BUN: 46 mg/dL — ABNORMAL HIGH (ref 6–23)
BUN: 49 mg/dL — AB (ref 6–23)
CALCIUM: 6.9 mg/dL — AB (ref 8.4–10.5)
CO2: 13 mmol/L — ABNORMAL LOW (ref 19–32)
CO2: 16 mmol/L — ABNORMAL LOW (ref 19–32)
CO2: 22 mmol/L (ref 19–32)
Calcium: 7.1 mg/dL — ABNORMAL LOW (ref 8.4–10.5)
Calcium: 7.1 mg/dL — ABNORMAL LOW (ref 8.4–10.5)
Chloride: 123 mmol/L — ABNORMAL HIGH (ref 96–112)
Chloride: 120 mmol/L — ABNORMAL HIGH (ref 96–112)
Chloride: 116 mmol/L — ABNORMAL HIGH (ref 96–112)
Creatinine, Ser: 1.69 mg/dL — ABNORMAL HIGH (ref 0.50–1.10)
Creatinine, Ser: 1.98 mg/dL — ABNORMAL HIGH (ref 0.50–1.10)
Creatinine, Ser: 2.22 mg/dL — ABNORMAL HIGH (ref 0.50–1.10)
GFR calc Af Amer: 30 mL/min — ABNORMAL LOW (ref >90)
GFR calc Af Amer: 24 mL/min — ABNORMAL LOW (ref >90)
GFR calc Af Amer: 21 mL/min — ABNORMAL LOW (ref >90)
GFR, EST NON AFRICAN AMERICAN: 26 mL/min — AB (ref >90)
GFR, EST NON AFRICAN AMERICAN: 21 mL/min — AB (ref >90)
GFR, EST NON AFRICAN AMERICAN: 18 mL/min — AB (ref >90)
GLUCOSE: 158 mg/dL — AB (ref 70–99)
Glucose, Bld: 188 mg/dL — ABNORMAL HIGH (ref 70–99)
Glucose, Bld: 127 mg/dL — ABNORMAL HIGH (ref 70–99)
POTASSIUM: 4.5 mmol/L (ref 3.5–5.1)
Potassium: 4.9 mmol/L (ref 3.5–5.1)
Potassium: 4.7 mmol/L (ref 3.5–5.1)
SODIUM: 142 mmol/L (ref 135–145)
SODIUM: 145 mmol/L (ref 135–145)
Sodium: 143 mmol/L (ref 135–145)

## 2014-12-10 LAB — POCT I-STAT 3, ART BLOOD GAS (G3+)
ACID-BASE DEFICIT: 14 mmol/L — AB (ref 0.0–2.0)
Bicarbonate: 10.8 mEq/L — ABNORMAL LOW (ref 20.0–24.0)
O2 Saturation: 95 %
Patient temperature: 96.5
TCO2: 11 mmol/L (ref 0–100)
pCO2 arterial: 22 mmHg — ABNORMAL LOW (ref 35.0–45.0)
pH, Arterial: 7.291 — ABNORMAL LOW (ref 7.350–7.450)
pO2, Arterial: 79 mmHg — ABNORMAL LOW (ref 80.0–100.0)

## 2014-12-10 LAB — URINE MICROSCOPIC-ADD ON

## 2014-12-10 MED ORDER — CEFUROXIME AXETIL 500 MG PO TABS
250.0000 mg | ORAL_TABLET | Freq: Every day | ORAL | Status: DC
  Administered 2014-12-10 – 2014-12-12 (×3): 250 mg via ORAL
  Filled 2014-12-10 (×2): qty 0.5
  Filled 2014-12-10 (×2): qty 1

## 2014-12-10 MED ORDER — HYDRALAZINE HCL 20 MG/ML IJ SOLN: 10.0000 mg | Freq: Once | INTRAMUSCULAR | Status: DC

## 2014-12-10 MED ORDER — HYDRALAZINE HCL 20 MG/ML IJ SOLN
10.0000 mg | INTRAMUSCULAR | Status: DC | PRN
  Administered 2014-12-10 – 2014-12-15 (×3): 10 mg via INTRAVENOUS
  Filled 2014-12-10 (×3): qty 1

## 2014-12-10 NOTE — Progress Notes (Signed)
Referring Physician(s): Dr Paulita Fujita  Subjective:  Large duodenal ulcer; bleeding GDA embolization in IR 2/22 Pt up in chair today VSS Hg stable Feels pretty well  Allergies: Penicillins  Medications: Prior to Admission medications   Medication Sig Start Date End Date Taking? Authorizing Provider  acetaminophen (TYLENOL) 650 MG CR tablet Take 650 mg by mouth every 8 (eight) hours as needed for pain.   Yes Historical Provider, MD  alendronate (FOSAMAX) 70 MG tablet Take 70 mg by mouth once a week. Wednesday 04/05/14  Yes Historical Provider, MD  amLODipine (NORVASC) 5 MG tablet Take 5 mg by mouth daily.   Yes Historical Provider, MD  aspirin 81 MG EC tablet Take 81 mg by mouth daily. 03/26/14  Yes Historical Provider, MD  CALCIUM PO Take by mouth.   Yes Historical Provider, MD  chlorthalidone (HYGROTON) 25 MG tablet Take 25 mg by mouth daily.   Yes Historical Provider, MD  cholecalciferol (VITAMIN D) 1000 UNITS tablet Take 1,000 Units by mouth daily.   Yes Historical Provider, MD  levothyroxine (SYNTHROID, LEVOTHROID) 25 MCG tablet Take 25 mcg by mouth daily before breakfast.   Yes Historical Provider, MD  lovastatin (MEVACOR) 20 MG tablet Take 20 mg by mouth at bedtime.   Yes Historical Provider, MD  Multiple Vitamin (MULTIVITAMIN) tablet Take 1 tablet by mouth daily.   Yes Historical Provider, MD  naproxen sodium (ALEVE) 220 MG tablet Take 220 mg by mouth. 2 tablets in morning   Yes Historical Provider, MD  vitamin E 400 UNIT capsule Take 400 Units by mouth daily.   Yes Historical Provider, MD  amLODipine (NORVASC) 10 MG tablet Take 1 tablet (10 mg total) by mouth daily. Patient not taking: Reported on 12/05/2014 04/11/13   Leonie Man, MD  chlorthalidone (HYGROTON) 25 MG tablet TAKE ONE TABLET BY MOUTH ONCE DAILY Patient not taking: Reported on 12/05/2014 08/19/14   Leonie Man, MD  lovastatin (MEVACOR) 20 MG tablet TAKE ONE TABLET BY MOUTH AT BEDTIME Patient not taking:  Reported on 12/05/2014 04/26/14   Leonie Man, MD     Vital Signs: BP 150/52 mmHg  Pulse 86  Temp(Src) 98.4 F (36.9 C) (Oral)  Resp 19  Ht 5' (1.524 m)  Wt 58 kg (127 lb 13.9 oz)  BMI 24.97 kg/m2  SpO2 100%  Physical Exam  Skin: Skin is warm and dry.  Rt groin NT; no bleeding no hematoma Soft Rt foot 2+ pulses BP stable    Imaging: Ir Angiogram Visceral Selective  12/09/2014   INDICATION: Acute upper GI bleed from duodenal ulcer. Patient is currently hemodynamically unstable.  Note, patient has history of chronic renal insufficiency (preprocedural creatinine -1.2), however the patient was deemed not a surgical candidate given her advanced age. Given her multiple medical comorbidities and unstable state, the decision was made to proceed with potential life saving mesenteric arteriogram and potential percutaneous embolization.  EXAM: 1. ULTRASOUND GUIDANCE FOR ARTERIAL ACCESS 2. CELIAC ARTERIOGRAM 3. GASTRODUODENAL ARTERIOGRAM AND PERCUTANEOUS COIL EMBOLIZATION 4. PANCREATICODUODENAL ARTERIOGRAM AND PERCUTANEOUS COIL EMBOLIZATION  COMPARISON:  NONE  MEDICATIONS: Patient continuing monitored and supported by the anesthesia service.  CONTRAST:  45 mL OMNIPAQUE IOHEXOL 300 MG/ML  SOLN  FLUOROSCOPY TIME:  11 minutes.  12 seconds (2,122 mGy)  COMPLICATIONS: None immediate  TECHNIQUE: Informed written consent was obtained from the patient's son after a discussion of the risks, benefits and alternatives to treatment. Questions regarding the procedure were encouraged and answered. A timeout was performed  prior to the initiation of the procedure.  The right groin was prepped and draped in the usual sterile fashion, and a sterile drape was applied covering the operative field. Maximum barrier sterile technique with sterile gowns and gloves were used for the procedure. A timeout was performed prior to the initiation of the procedure. Local anesthesia was provided with 1% lidocaine.  The right femoral  head was marked fluoroscopically. Under ultrasound guidance, the right common femoral artery was accessed with a micropuncture kit after the overlying soft tissues were anesthetized with 1% lidocaine. An ultrasound image was saved for documentation purposes. The micropuncture sheath was exchanged for a 5 [place] vascular sheath over a Bentson wire. A closure arteriogram was performed through the side of the sheath confirming access within the right common femoral artery.  Over a Bentson wire, a Mickelson catheter was advanced to the level of the thoracic aorta where it was back bled and flushed. The catheter was then utilized to select the celiac artery. A celiac arteriogram was performed.  With the use of a synchro 14 micro catheter, a regular Renegade micro catheter was advanced through the gastroduodenal artery into the extravasated duodenal ulcer. Limited contrast injection confirmed appropriate positioning and the GDA was back coiled to near its origin.  Post embolization arteriogram demonstrated a hypertrophied pancreaticoduodenal branch which appeared to provide collateral supply to the area of extravasation within the duodenum. As such, the micro catheter was advanced into the distal aspect of the pancreaticoduodenal artery. Contrast injection confirmed appropriate positioning and the vessel was back coiled to near its origin.  The microcatheter was retracted into the proper hepatic artery and a proper hepatic arteriogram was performed. The micro catheter was removed and a completion celiac arteriogram was performed via the [facility] catheter. Images were reviewed and the procedure was terminated.  All wires, catheters and sheaths were removed from the patient. Hemostasis was achieved at the right groin access site with deployment of an Exoseal closure device. A dressing was placed. The patient tolerated the procedure well without immediate postprocedural complication and was noted to be hemodynamically  stable at the time of procedure completion.  FINDINGS: Celiac arteriogram demonstrates frank extravasation from a branch arising from the mid aspect of the GDA. All branches of the celiac artery appear diffusely spasmodic compatible with hypovolemic state.  With the use of a a micro wire, the micro catheter was advanced into the area of active extravasation and the GDA was subsequently back coiled to near its origin.  Post GDA arteriogram demonstrates potential persistent ill-defined supply to the descending duodenum via hypertrophied blanch of the pancreaticoduodenal artery. Note was also made of retrograde supply to the proximal SMA. As such, this branch was cannulated and also back coiled to near its origin.  Completion arteriograms performed via the micro catheter from the common hepatic and via the Mickelson catheter from the celiac did not demonstrate any persistent supply to the area of active extravasation within the descending segment of the duodenum.  Completion superior mesenteric arteriogram was not performed given patient's renal insufficiency  IMPRESSION: Technically successful percutaneous coil embolization of the GDA and a hypertrophied pancreaticoduodenal artery for active extravasation.   Electronically Signed   By: Sandi Mariscal M.D.   On: 12/09/2014 17:27   Ir Angiogram Selective Each Additional Vessel  12/09/2014   INDICATION: Acute upper GI bleed from duodenal ulcer. Patient is currently hemodynamically unstable.  Note, patient has history of chronic renal insufficiency (preprocedural creatinine -1.2), however the patient was  deemed not a surgical candidate given her advanced age. Given her multiple medical comorbidities and unstable state, the decision was made to proceed with potential life saving mesenteric arteriogram and potential percutaneous embolization.  EXAM: 1. ULTRASOUND GUIDANCE FOR ARTERIAL ACCESS 2. CELIAC ARTERIOGRAM 3. GASTRODUODENAL ARTERIOGRAM AND PERCUTANEOUS COIL  EMBOLIZATION 4. PANCREATICODUODENAL ARTERIOGRAM AND PERCUTANEOUS COIL EMBOLIZATION  COMPARISON:  NONE  MEDICATIONS: Patient continuing monitored and supported by the anesthesia service.  CONTRAST:  45 mL OMNIPAQUE IOHEXOL 300 MG/ML  SOLN  FLUOROSCOPY TIME:  11 minutes.  12 seconds (1,448 mGy)  COMPLICATIONS: None immediate  TECHNIQUE: Informed written consent was obtained from the patient's son after a discussion of the risks, benefits and alternatives to treatment. Questions regarding the procedure were encouraged and answered. A timeout was performed prior to the initiation of the procedure.  The right groin was prepped and draped in the usual sterile fashion, and a sterile drape was applied covering the operative field. Maximum barrier sterile technique with sterile gowns and gloves were used for the procedure. A timeout was performed prior to the initiation of the procedure. Local anesthesia was provided with 1% lidocaine.  The right femoral head was marked fluoroscopically. Under ultrasound guidance, the right common femoral artery was accessed with a micropuncture kit after the overlying soft tissues were anesthetized with 1% lidocaine. An ultrasound image was saved for documentation purposes. The micropuncture sheath was exchanged for a 5 [place] vascular sheath over a Bentson wire. A closure arteriogram was performed through the side of the sheath confirming access within the right common femoral artery.  Over a Bentson wire, a Mickelson catheter was advanced to the level of the thoracic aorta where it was back bled and flushed. The catheter was then utilized to select the celiac artery. A celiac arteriogram was performed.  With the use of a synchro 14 micro catheter, a regular Renegade micro catheter was advanced through the gastroduodenal artery into the extravasated duodenal ulcer. Limited contrast injection confirmed appropriate positioning and the GDA was back coiled to near its origin.  Post  embolization arteriogram demonstrated a hypertrophied pancreaticoduodenal branch which appeared to provide collateral supply to the area of extravasation within the duodenum. As such, the micro catheter was advanced into the distal aspect of the pancreaticoduodenal artery. Contrast injection confirmed appropriate positioning and the vessel was back coiled to near its origin.  The microcatheter was retracted into the proper hepatic artery and a proper hepatic arteriogram was performed. The micro catheter was removed and a completion celiac arteriogram was performed via the [facility] catheter. Images were reviewed and the procedure was terminated.  All wires, catheters and sheaths were removed from the patient. Hemostasis was achieved at the right groin access site with deployment of an Exoseal closure device. A dressing was placed. The patient tolerated the procedure well without immediate postprocedural complication and was noted to be hemodynamically stable at the time of procedure completion.  FINDINGS: Celiac arteriogram demonstrates frank extravasation from a branch arising from the mid aspect of the GDA. All branches of the celiac artery appear diffusely spasmodic compatible with hypovolemic state.  With the use of a a micro wire, the micro catheter was advanced into the area of active extravasation and the GDA was subsequently back coiled to near its origin.  Post GDA arteriogram demonstrates potential persistent ill-defined supply to the descending duodenum via hypertrophied blanch of the pancreaticoduodenal artery. Note was also made of retrograde supply to the proximal SMA. As such, this branch was cannulated and  also back coiled to near its origin.  Completion arteriograms performed via the micro catheter from the common hepatic and via the Mickelson catheter from the celiac did not demonstrate any persistent supply to the area of active extravasation within the descending segment of the duodenum.   Completion superior mesenteric arteriogram was not performed given patient's renal insufficiency  IMPRESSION: Technically successful percutaneous coil embolization of the GDA and a hypertrophied pancreaticoduodenal artery for active extravasation.   Electronically Signed   By: Sandi Mariscal M.D.   On: 12/09/2014 17:27   Ir Angiogram Selective Each Additional Vessel  12/09/2014   INDICATION: Acute upper GI bleed from duodenal ulcer. Patient is currently hemodynamically unstable.  Note, patient has history of chronic renal insufficiency (preprocedural creatinine -1.2), however the patient was deemed not a surgical candidate given her advanced age. Given her multiple medical comorbidities and unstable state, the decision was made to proceed with potential life saving mesenteric arteriogram and potential percutaneous embolization.  EXAM: 1. ULTRASOUND GUIDANCE FOR ARTERIAL ACCESS 2. CELIAC ARTERIOGRAM 3. GASTRODUODENAL ARTERIOGRAM AND PERCUTANEOUS COIL EMBOLIZATION 4. PANCREATICODUODENAL ARTERIOGRAM AND PERCUTANEOUS COIL EMBOLIZATION  COMPARISON:  NONE  MEDICATIONS: Patient continuing monitored and supported by the anesthesia service.  CONTRAST:  45 mL OMNIPAQUE IOHEXOL 300 MG/ML  SOLN  FLUOROSCOPY TIME:  11 minutes.  12 seconds (4,166 mGy)  COMPLICATIONS: None immediate  TECHNIQUE: Informed written consent was obtained from the patient's son after a discussion of the risks, benefits and alternatives to treatment. Questions regarding the procedure were encouraged and answered. A timeout was performed prior to the initiation of the procedure.  The right groin was prepped and draped in the usual sterile fashion, and a sterile drape was applied covering the operative field. Maximum barrier sterile technique with sterile gowns and gloves were used for the procedure. A timeout was performed prior to the initiation of the procedure. Local anesthesia was provided with 1% lidocaine.  The right femoral head was marked  fluoroscopically. Under ultrasound guidance, the right common femoral artery was accessed with a micropuncture kit after the overlying soft tissues were anesthetized with 1% lidocaine. An ultrasound image was saved for documentation purposes. The micropuncture sheath was exchanged for a 5 [place] vascular sheath over a Bentson wire. A closure arteriogram was performed through the side of the sheath confirming access within the right common femoral artery.  Over a Bentson wire, a Mickelson catheter was advanced to the level of the thoracic aorta where it was back bled and flushed. The catheter was then utilized to select the celiac artery. A celiac arteriogram was performed.  With the use of a synchro 14 micro catheter, a regular Renegade micro catheter was advanced through the gastroduodenal artery into the extravasated duodenal ulcer. Limited contrast injection confirmed appropriate positioning and the GDA was back coiled to near its origin.  Post embolization arteriogram demonstrated a hypertrophied pancreaticoduodenal branch which appeared to provide collateral supply to the area of extravasation within the duodenum. As such, the micro catheter was advanced into the distal aspect of the pancreaticoduodenal artery. Contrast injection confirmed appropriate positioning and the vessel was back coiled to near its origin.  The microcatheter was retracted into the proper hepatic artery and a proper hepatic arteriogram was performed. The micro catheter was removed and a completion celiac arteriogram was performed via the [facility] catheter. Images were reviewed and the procedure was terminated.  All wires, catheters and sheaths were removed from the patient. Hemostasis was achieved at the right groin access site with  deployment of an Exoseal closure device. A dressing was placed. The patient tolerated the procedure well without immediate postprocedural complication and was noted to be hemodynamically stable at the time of  procedure completion.  FINDINGS: Celiac arteriogram demonstrates frank extravasation from a branch arising from the mid aspect of the GDA. All branches of the celiac artery appear diffusely spasmodic compatible with hypovolemic state.  With the use of a a micro wire, the micro catheter was advanced into the area of active extravasation and the GDA was subsequently back coiled to near its origin.  Post GDA arteriogram demonstrates potential persistent ill-defined supply to the descending duodenum via hypertrophied blanch of the pancreaticoduodenal artery. Note was also made of retrograde supply to the proximal SMA. As such, this branch was cannulated and also back coiled to near its origin.  Completion arteriograms performed via the micro catheter from the common hepatic and via the Mickelson catheter from the celiac did not demonstrate any persistent supply to the area of active extravasation within the descending segment of the duodenum.  Completion superior mesenteric arteriogram was not performed given patient's renal insufficiency  IMPRESSION: Technically successful percutaneous coil embolization of the GDA and a hypertrophied pancreaticoduodenal artery for active extravasation.   Electronically Signed   By: Sandi Mariscal M.D.   On: 12/09/2014 17:27   Ir Angiogram Follow Up Study  12/09/2014   INDICATION: Acute upper GI bleed from duodenal ulcer. Patient is currently hemodynamically unstable.  Note, patient has history of chronic renal insufficiency (preprocedural creatinine -1.2), however the patient was deemed not a surgical candidate given her advanced age. Given her multiple medical comorbidities and unstable state, the decision was made to proceed with potential life saving mesenteric arteriogram and potential percutaneous embolization.  EXAM: 1. ULTRASOUND GUIDANCE FOR ARTERIAL ACCESS 2. CELIAC ARTERIOGRAM 3. GASTRODUODENAL ARTERIOGRAM AND PERCUTANEOUS COIL EMBOLIZATION 4. PANCREATICODUODENAL ARTERIOGRAM  AND PERCUTANEOUS COIL EMBOLIZATION  COMPARISON:  NONE  MEDICATIONS: Patient continuing monitored and supported by the anesthesia service.  CONTRAST:  45 mL OMNIPAQUE IOHEXOL 300 MG/ML  SOLN  FLUOROSCOPY TIME:  11 minutes.  12 seconds (7,371 mGy)  COMPLICATIONS: None immediate  TECHNIQUE: Informed written consent was obtained from the patient's son after a discussion of the risks, benefits and alternatives to treatment. Questions regarding the procedure were encouraged and answered. A timeout was performed prior to the initiation of the procedure.  The right groin was prepped and draped in the usual sterile fashion, and a sterile drape was applied covering the operative field. Maximum barrier sterile technique with sterile gowns and gloves were used for the procedure. A timeout was performed prior to the initiation of the procedure. Local anesthesia was provided with 1% lidocaine.  The right femoral head was marked fluoroscopically. Under ultrasound guidance, the right common femoral artery was accessed with a micropuncture kit after the overlying soft tissues were anesthetized with 1% lidocaine. An ultrasound image was saved for documentation purposes. The micropuncture sheath was exchanged for a 5 [place] vascular sheath over a Bentson wire. A closure arteriogram was performed through the side of the sheath confirming access within the right common femoral artery.  Over a Bentson wire, a Mickelson catheter was advanced to the level of the thoracic aorta where it was back bled and flushed. The catheter was then utilized to select the celiac artery. A celiac arteriogram was performed.  With the use of a synchro 14 micro catheter, a regular Renegade micro catheter was advanced through the gastroduodenal artery into the extravasated duodenal  ulcer. Limited contrast injection confirmed appropriate positioning and the GDA was back coiled to near its origin.  Post embolization arteriogram demonstrated a hypertrophied  pancreaticoduodenal branch which appeared to provide collateral supply to the area of extravasation within the duodenum. As such, the micro catheter was advanced into the distal aspect of the pancreaticoduodenal artery. Contrast injection confirmed appropriate positioning and the vessel was back coiled to near its origin.  The microcatheter was retracted into the proper hepatic artery and a proper hepatic arteriogram was performed. The micro catheter was removed and a completion celiac arteriogram was performed via the [facility] catheter. Images were reviewed and the procedure was terminated.  All wires, catheters and sheaths were removed from the patient. Hemostasis was achieved at the right groin access site with deployment of an Exoseal closure device. A dressing was placed. The patient tolerated the procedure well without immediate postprocedural complication and was noted to be hemodynamically stable at the time of procedure completion.  FINDINGS: Celiac arteriogram demonstrates frank extravasation from a branch arising from the mid aspect of the GDA. All branches of the celiac artery appear diffusely spasmodic compatible with hypovolemic state.  With the use of a a micro wire, the micro catheter was advanced into the area of active extravasation and the GDA was subsequently back coiled to near its origin.  Post GDA arteriogram demonstrates potential persistent ill-defined supply to the descending duodenum via hypertrophied blanch of the pancreaticoduodenal artery. Note was also made of retrograde supply to the proximal SMA. As such, this branch was cannulated and also back coiled to near its origin.  Completion arteriograms performed via the micro catheter from the common hepatic and via the Mickelson catheter from the celiac did not demonstrate any persistent supply to the area of active extravasation within the descending segment of the duodenum.  Completion superior mesenteric arteriogram was not performed  given patient's renal insufficiency  IMPRESSION: Technically successful percutaneous coil embolization of the GDA and a hypertrophied pancreaticoduodenal artery for active extravasation.   Electronically Signed   By: Sandi Mariscal M.D.   On: 12/09/2014 17:27   Ir US Guide Vasc Access Right  12/09/2014   INDICATION: Acute upper GI bleed from duodenal ulcer. Patient is currently hemodynamically unstable.  Note, patient has history of chronic renal insufficiency (preprocedural creatinine -1.2), however the patient was deemed not a surgical candidate given her advanced age. Given her multiple medical comorbidities and unstable state, the decision was made to proceed with potential life saving mesenteric arteriogram and potential percutaneous embolization.  EXAM: 1. ULTRASOUND GUIDANCE FOR ARTERIAL ACCESS 2. CELIAC ARTERIOGRAM 3. GASTRODUODENAL ARTERIOGRAM AND PERCUTANEOUS COIL EMBOLIZATION 4. PANCREATICODUODENAL ARTERIOGRAM AND PERCUTANEOUS COIL EMBOLIZATION  COMPARISON:  NONE  MEDICATIONS: Patient continuing monitored and supported by the anesthesia service.  CONTRAST:  45 mL OMNIPAQUE IOHEXOL 300 MG/ML  SOLN  FLUOROSCOPY TIME:  11 minutes.  12 seconds (6,160 mGy)  COMPLICATIONS: None immediate  TECHNIQUE: Informed written consent was obtained from the patient's son after a discussion of the risks, benefits and alternatives to treatment. Questions regarding the procedure were encouraged and answered. A timeout was performed prior to the initiation of the procedure.  The right groin was prepped and draped in the usual sterile fashion, and a sterile drape was applied covering the operative field. Maximum barrier sterile technique with sterile gowns and gloves were used for the procedure. A timeout was performed prior to the initiation of the procedure. Local anesthesia was provided with 1% lidocaine.  The right femoral head  was marked fluoroscopically. Under ultrasound guidance, the right common femoral artery was  accessed with a micropuncture kit after the overlying soft tissues were anesthetized with 1% lidocaine. An ultrasound image was saved for documentation purposes. The micropuncture sheath was exchanged for a 5 [place] vascular sheath over a Bentson wire. A closure arteriogram was performed through the side of the sheath confirming access within the right common femoral artery.  Over a Bentson wire, a Mickelson catheter was advanced to the level of the thoracic aorta where it was back bled and flushed. The catheter was then utilized to select the celiac artery. A celiac arteriogram was performed.  With the use of a synchro 14 micro catheter, a regular Renegade micro catheter was advanced through the gastroduodenal artery into the extravasated duodenal ulcer. Limited contrast injection confirmed appropriate positioning and the GDA was back coiled to near its origin.  Post embolization arteriogram demonstrated a hypertrophied pancreaticoduodenal branch which appeared to provide collateral supply to the area of extravasation within the duodenum. As such, the micro catheter was advanced into the distal aspect of the pancreaticoduodenal artery. Contrast injection confirmed appropriate positioning and the vessel was back coiled to near its origin.  The microcatheter was retracted into the proper hepatic artery and a proper hepatic arteriogram was performed. The micro catheter was removed and a completion celiac arteriogram was performed via the [facility] catheter. Images were reviewed and the procedure was terminated.  All wires, catheters and sheaths were removed from the patient. Hemostasis was achieved at the right groin access site with deployment of an Exoseal closure device. A dressing was placed. The patient tolerated the procedure well without immediate postprocedural complication and was noted to be hemodynamically stable at the time of procedure completion.  FINDINGS: Celiac arteriogram demonstrates frank  extravasation from a branch arising from the mid aspect of the GDA. All branches of the celiac artery appear diffusely spasmodic compatible with hypovolemic state.  With the use of a a micro wire, the micro catheter was advanced into the area of active extravasation and the GDA was subsequently back coiled to near its origin.  Post GDA arteriogram demonstrates potential persistent ill-defined supply to the descending duodenum via hypertrophied blanch of the pancreaticoduodenal artery. Note was also made of retrograde supply to the proximal SMA. As such, this branch was cannulated and also back coiled to near its origin.  Completion arteriograms performed via the micro catheter from the common hepatic and via the Mickelson catheter from the celiac did not demonstrate any persistent supply to the area of active extravasation within the descending segment of the duodenum.  Completion superior mesenteric arteriogram was not performed given patient's renal insufficiency  IMPRESSION: Technically successful percutaneous coil embolization of the GDA and a hypertrophied pancreaticoduodenal artery for active extravasation.   Electronically Signed   By: Sandi Mariscal M.D.   On: 12/09/2014 17:27   [place] [employer]  12/09/2014   INDICATION: Acute upper GI bleed from duodenal ulcer. Patient is currently hemodynamically unstable.  Note, patient has history of chronic renal insufficiency (preprocedural creatinine -1.2), however the patient was deemed not a surgical candidate given her advanced age. Given her multiple medical comorbidities and unstable state, the decision was made to proceed with potential life saving mesenteric arteriogram and potential percutaneous embolization.  EXAM: 1. ULTRASOUND GUIDANCE FOR ARTERIAL ACCESS 2. CELIAC ARTERIOGRAM 3. GASTRODUODENAL ARTERIOGRAM AND PERCUTANEOUS COIL EMBOLIZATION 4. PANCREATICODUODENAL ARTERIOGRAM AND PERCUTANEOUS COIL EMBOLIZATION   COMPARISON:  NONE  MEDICATIONS: Patient continuing monitored and supported by the anesthesia service.  CONTRAST:  45 mL OMNIPAQUE IOHEXOL 300 MG/ML  SOLN  FLUOROSCOPY TIME:  11 minutes.  12 seconds (4,174 mGy)  COMPLICATIONS: None immediate  TECHNIQUE: Informed written consent was obtained from the patient's son after a discussion of the risks, benefits and alternatives to treatment. Questions regarding the procedure were encouraged and answered. A timeout was performed prior to the initiation of the procedure.  The right groin was prepped and draped in the usual sterile fashion, and a sterile drape was applied covering the operative field. Maximum barrier sterile technique with sterile gowns and gloves were used for the procedure. A timeout was performed prior to the initiation of the procedure. Local anesthesia was provided with 1% lidocaine.  The right femoral head was marked fluoroscopically. Under ultrasound guidance, the right common femoral artery was accessed with a micropuncture kit after the overlying soft tissues were anesthetized with 1% lidocaine. An ultrasound image was saved for documentation purposes. The micropuncture sheath was exchanged for a 5 [place] vascular sheath over a Bentson wire. A closure arteriogram was performed through the side of the sheath confirming access within the right common femoral artery.  Over a Bentson wire, a Mickelson catheter was advanced to the level of the thoracic aorta where it was back bled and flushed. The catheter was then utilized to select the celiac artery. A celiac arteriogram was performed.  With the use of a synchro 14 micro catheter, a regular Renegade micro catheter was advanced through the gastroduodenal artery into the extravasated duodenal ulcer. Limited contrast injection confirmed appropriate positioning and the GDA was back coiled to near its origin.  Post embolization arteriogram demonstrated a hypertrophied pancreaticoduodenal branch which appeared  to provide collateral supply to the area of extravasation within the duodenum. As such, the micro catheter was advanced into the distal aspect of the pancreaticoduodenal artery. Contrast injection confirmed appropriate positioning and the vessel was back coiled to near its origin.  The microcatheter was retracted into the proper hepatic artery and a proper hepatic arteriogram was performed. The micro catheter was removed and a completion celiac arteriogram was performed via the [facility] catheter. Images were reviewed and the procedure was terminated.  All wires, catheters and sheaths were removed from the patient. Hemostasis was achieved at the right groin access site with deployment of an Exoseal closure device. A dressing was placed. The patient tolerated the procedure well without immediate postprocedural complication and was noted to be hemodynamically stable at the time of procedure completion.  FINDINGS: Celiac arteriogram demonstrates frank extravasation from a branch arising from the mid aspect of the GDA. All branches of the celiac artery appear diffusely spasmodic compatible with hypovolemic state.  With the use of a a micro wire, the micro catheter was advanced into the area of active extravasation and the GDA was subsequently back coiled to near its origin.  Post GDA arteriogram demonstrates potential persistent ill-defined supply to the descending duodenum via hypertrophied blanch of the pancreaticoduodenal artery. Note was also made of retrograde supply to the proximal SMA. As such, this branch was cannulated and also back coiled to near its origin.  Completion arteriograms performed via the micro catheter from the common hepatic and via the Mickelson catheter from the celiac did not demonstrate any persistent supply to the area of active extravasation within the descending segment of the duodenum.  Completion superior mesenteric arteriogram was not performed given patient's renal insufficiency   IMPRESSION: Technically  successful percutaneous coil embolization of the GDA and a hypertrophied pancreaticoduodenal artery for active extravasation.   Electronically Signed   By: Sandi Mariscal M.D.   On: 12/09/2014 17:27    Labs:  CBC:  Recent Labs  12/09/14 2059 12/09/14 2230 12/10/14 0320 12/10/14 1100  WBC 19.0* 23.0* 24.9* 22.3*  HGB 18.2* 17.5* 16.6* 14.8  HCT 52.3* 50.3* 46.2* 41.1  PLT PLATELET CLUMPS NOTED ON SMEAR, COUNT APPEARS DECREASED PLATELET CLUMPS NOTED ON SMEAR, COUNT APPEARS DECREASED 19* 39*    COAGS:  Recent Labs  12/06/14 0415  INR 1.32  APTT 27    BMP:  Recent Labs  12/07/14 0320 12/08/14 0330 12/09/14 0312 12/09/14 1552 12/09/14 1558 12/10/14 0320  NA 139 142 142 145 145 143  K 3.9 3.9 4.2 4.2 4.3 4.9  CL 113* 115* 119*  --  117* 123*  CO2 _0 --   --  13*  GLUCOSE 85 90 114*  --  298* 188*  BUN 31* 35* 39*  --  22 35*  CALCIUM 8.1* 8.3* 7.3*  --   --  7.1*  CREATININE 1.27* 1.30* 1.24*  --  1.00 1.69*  GFRNONAA 36* 35* 37*  --   --  26*  GFRAA 42* 41* 43*  --   --  30*    LIVER FUNCTION TESTS:  Recent Labs  12/05/14 0933 12/06/14 0415 12/08/14 0330  BILITOT 0.9 1.0 0.5  AST _1 ALT _2 ALKPHOS 43 40 35*  PROT 5.3* 4.7* 4.5*  ALBUMIN 2.7* 2.6* 2.6*    Assessment and Plan:  Duodenal ulcer bleed GDA embolization in IR- Dr Pascal Lux 2/22 Doing well Plan per Dr Paulita Fujita  Signed: Monia Sabal A 12/10/2014, 1:55 PM   I spent a total of 15 Minutes in face to face in clinical consultation/evaluation, greater than 50% of which was counseling/coordinating care for GI bleed/ embolization

## 2014-12-10 NOTE — Progress Notes (Signed)
CRITICAL VALUE ALERT  Critical value received:  Platelets 19  Date of notification:  12/10/2014  Time of notification:  04:44  Critical value read back:Yes.    Nurse who received alert:  BShepherd, RN  MD notified (1st page):  Deterding, E.  Time of first page:  04:52  MD notified (2nd page):  Time of second page:  Responding MD:  Deterding, E.  Time MD responded:  04:53

## 2014-12-10 NOTE — Evaluation (Signed)
Occupational Therapy Evaluation Patient Details Name: Sheila Murphy MRN: [id number] DOB: 1922/02/13 Today's Date: 12/10/2014    History of Present Illness 81 y.o. Female with history of hypertension, hypothyroidism, dyslipidemia, known aortic valve sclerosis, asymptomatic right carotid artery disease, and osteoarthritis on regular NSAIDs who was sent to the emergency room with [facility]. Pt S/P EGD for GI Bleed.   Clinical Impression   Pt was performing self care independently prior to admission.  She ambulated with a cane or walker and hand rail in her home.  Pt has 2 sons who provide 24 hour care and perform housekeeping and meal prep for pt.  Pt requires min assist for mobility. She presents with generalized weakness, impaired balance, and intermittent blurriness of vision. Unclear of pt's baseline vision or cognition with no family present for evaluation. Will follow acutely.      Follow Up Recommendations  Home health OT;Supervision/Assistance - 24 hour (home health aide)    Equipment Recommendations       Recommendations for Other Services       Precautions / Restrictions Precautions Precautions: Fall Restrictions Weight Bearing Restrictions: No      Mobility Bed Mobility Overal bed mobility: Needs Assistance Bed Mobility: Rolling;Sidelying to Sit Rolling: Min assist Sidelying to sit: Mod assist          Transfers Overall transfer level: Needs assistance Equipment used: Rolling walker (2 wheeled) Transfers: Sit to/from Stand Sit to Stand: Min assist         General transfer comment: pulled up on RW    Balance Overall balance assessment: Needs assistance Sitting-balance support: Feet supported Sitting balance-Leahy Scale: Fair       Standing balance-Leahy Scale: Poor                              ADL Overall ADL's : Needs assistance/impaired Eating/Feeding: Set up;Sitting   Grooming: Wash/dry hands;Wash/dry face;Sitting;Set up   Upper  Body Bathing: Minimal assitance;Sitting   Lower Body Bathing: Maximal assistance;Sit to/from stand   Upper Body Dressing : Minimal assistance;Sitting   Lower Body Dressing: Maximal assistance;Sit to/from stand   Toilet Transfer: Minimal assistance;Ambulation;BSC;RW   Toileting- Clothing Manipulation and Hygiene: Total assistance;Sit to/from stand       Functional mobility during ADLs: Minimal assistance;+2 for safety/equipment;Rolling walker General ADL Comments: Pt with incontinence of bowel.     Vision Additional Comments: unclear of pt's baseline vision, repeatedly reporting blurriness   Perception     Praxis      Pertinent Vitals/Pain Pain Assessment: No/denies pain     Hand Dominance Right   Extremity/Trunk Assessment Upper Extremity Assessment Upper Extremity Assessment: Generalized weakness   Lower Extremity Assessment Lower Extremity Assessment: Defer to PT evaluation       Communication Communication Communication: HOH   Cognition Arousal/Alertness: Awake/alert Behavior During Therapy: WFL for tasks assessed/performed Overall Cognitive Status: No family/caregiver present to determine baseline cognitive functioning                     General Comments       Exercises       Shoulder Instructions      Home Living Family/patient expects to be discharged to:: Private residence Living Arrangements: Children (2 sons) Available Help at Discharge: Family;Available 24 hours/day Type of Home: House Home Access: Level entry     Home Layout: One level     Bathroom Shower/Tub: Other (comment) (sponge bathes)   Bathroom  Toilet: Standard     Home Equipment: Walker - 2 wheels;Cane - single point;Bedside commode;Wheelchair - manual          Prior Functioning/Environment Level of Independence: Needs assistance  Gait / Transfers Assistance Needed: pt states she walks with a cane and handrail in the house and RW out side ADL's / Homemaking  Assistance Needed: pt states she performs ADLs and sons do all the housework and meal prep        OT Diagnosis: Generalized weakness;Cognitive deficits;Acute pain;Disturbance of vision   OT Problem List: Decreased strength;Decreased activity tolerance;Impaired balance (sitting and/or standing);Impaired vision/perception;Decreased coordination;Decreased cognition;Decreased knowledge of use of DME or AE   OT Treatment/Interventions: Self-care/ADL training;DME and/or AE instruction;Therapeutic activities;Patient/family education;Balance training;Visual/perceptual remediation/compensation    OT Goals(Current goals can be found in the care plan section) Acute Rehab OT Goals Patient Stated Goal: pt agreeable to OOB activity OT Goal Formulation: With patient Time For Goal Achievement: 12/24/14 Potential to Achieve Goals: Good ADL Goals Pt Will Perform Grooming: with supervision;standing Pt Will Perform Upper Body Bathing: with supervision;sitting Pt Will Perform Lower Body Bathing: with supervision;sit to/from stand Pt Will Perform Upper Body Dressing: with supervision;sitting Pt Will Perform Lower Body Dressing: with supervision;sit to/from stand Pt Will Transfer to Toilet: with supervision;ambulating;regular height toilet Pt Will Perform Toileting - Clothing Manipulation and hygiene: with supervision;sit to/from stand Additional ADL Goal #1: Pt will perform bed mobility with supervision.  OT Frequency: Min 2X/week   Barriers to D/C:            Co-evaluation PT/OT/SLP Co-Evaluation/Treatment: Yes Reason for Co-Treatment: For patient/therapist safety   OT goals addressed during session: ADL's and self-care      End of Session Equipment Utilized During Treatment: Gait belt;Rolling walker Nurse Communication: Mobility status (episode of bloody stool)  Activity Tolerance: Patient tolerated treatment well Patient left: in chair;with call bell/phone within reach   Time:  1040-1115 OT Time Calculation (min): 35 min Charges:  OT General Charges $OT Visit: 1 Procedure OT Evaluation $Initial OT Evaluation Tier I: 1 Procedure G-Codes:    Evern BioMayberry, Lawrencia Mauney Lynn 12/10/2014, 11:41 AM  [phone]

## 2014-12-10 NOTE — Progress Notes (Signed)
Subjective: Few maroon stools post embolization and one this morning. No abdominal pain. No hematemesis.  Objective: Vital signs in last 24 hours: Temp:  [95.3 F (35.2 C)-98.9 F (37.2 C)] 97.5 F (36.4 C) (02/23 0806) Pulse Rate:  [27-119] 76 (02/23 0800) Resp:  [16-31] 23 (02/23 0800) BP: (60-169)/(13-148) 150/91 mmHg (02/23 0800) SpO2:  [49 %-100 %] 99 % (02/23 0800) Arterial Line BP: (123-202)/(50-83) 163/59 mmHg (02/23 0800) Weight:  [57.6 kg (126 lb 15.8 oz)-58 kg (127 lb 13.9 oz)] 58 kg (127 lb 13.9 oz) (02/23 0400) Weight change: 3.8 kg (8 lb 6 oz) Last BM Date: 12/09/14  PE: GEN:  Alert, elderly but much younger- and active-appearing than stated age ABD:  Soft, non-tender, non-distended  Lab Results: CBC    Component Value Date/Time   WBC 24.9* 12/10/2014 0320   RBC 5.55* 12/10/2014 0320   RBC 3.47* 12/05/2014 1102   HGB 16.6* 12/10/2014 0320   HCT 46.2* 12/10/2014 0320   PLT 19* 12/10/2014 0320   MCV 83.2 12/10/2014 0320   MCH 29.9 12/10/2014 0320   MCHC 35.9 12/10/2014 0320   RDW 15.4 12/10/2014 0320   LYMPHSABS 0.5* 12/05/2014 0933   MONOABS 0.5 12/05/2014 0933   EOSABS 0.0 12/05/2014 0933   BASOSABS 0.0 12/05/2014 0933   CMP     Component Value Date/Time   NA 143 12/10/2014 0320   K 4.9 12/10/2014 0320   CL 123* 12/10/2014 0320   CO2 13* 12/10/2014 0320   GLUCOSE 188* 12/10/2014 0320   BUN 35* 12/10/2014 0320   CREATININE 1.69* 12/10/2014 0320   CALCIUM 7.1* 12/10/2014 0320   PROT 4.5* 12/08/2014 0330   ALBUMIN 2.6* 12/08/2014 0330   AST 25 12/08/2014 0330   ALT 14 12/08/2014 0330   ALKPHOS 35* 12/08/2014 0330   BILITOT 0.5 12/08/2014 0330   GFRNONAA 26* 12/10/2014 0320   GFRAA 30* 12/10/2014 0320   Assessment:  1.  Acute blood loss anemia.  Hgb significantly improved post-transfusion, in fact more than expected, possible lab error. 2.  Duodenal ulcer with active bleeding, unable to treat endoscopically, s/p IR-mediated coil embolization  of gastroduodenal artery and hypertrophied pancreaticoduodenal artery. 3.  Upper GI bleeding, from #2 above.  Bleeding has significantly slowed since embolization.  Likely that her bowel movements post procedure are more reflective of passage of old blood rather than ongoing bleeding.  Plan:  1.  Protonix drip x 48 more hours. 2.  No NSAIDs ever again. 3.  Trial of clear liquids. 4.  Follow CBC and clinical course. 5.  If does well today, might be able to transfer out of ICU late today or tomorrow morning. 6.  Will follow.   Freddy JakschUTLAW,Umaima Scholten M 12/10/2014, 8:54 AM

## 2014-12-10 NOTE — Progress Notes (Signed)
UR Completed.  [phone]  

## 2014-12-10 NOTE — Progress Notes (Signed)
Patient ID: Sheila Murphy, female   DOB: [birth year], 81 y.o.   MRN: [id number]     [address] [place], [place] [address]    Phone: [phone] FAX: [phone]     Subjective: Several dark bloody BMs.  H&h are high, ? Concentrated.  Platelets are low, ?lab error.  Pt denies sob, abdominal pain or nausea.    Objective:  Vital signs:  Filed Vitals:   12/10/14 0431 12/10/14 0500 12/10/14 0600 12/10/14 0700  BP:  142/69 126/69 140/66  Pulse: 71 69 73 74  Temp: 97.5 F (36.4 C)     TempSrc: Oral     Resp: $Remo'22 17 20 22  'ZAmik$ Height:      Weight:      SpO2: 100% 99% 100% 99%    Last BM Date: 12/09/14  Intake/Output   Yesterday:  02/22 0701 - 02/23 0700 In: 4997.7 [P.O.:120; I.V.:3101.7; Blood:1166; IV Piggyback:610] Out: 750 [Urine:750] This shift:    I/O last 3 completed shifts: In: 5597.7 [P.O.:120; I.V.:3701.7; Blood:1166; IV Piggyback:610] Out: 750 [Urine:750]   Physical Exam: General: Pt awake/alert/oriented x4 in no acute distress Abdomen: Soft.  Nondistended.  Non tender.  Large ventral hernia, soft.   No evidence of peritonitis.  No incarcerated hernias.    Problem List:   Principal Problem:   GIB (gastrointestinal bleeding) Active Problems:   Essential hypertension   Aortic valve sclerosis   Acute blood loss anemia   Syncope due to orthostatic hypotension   Hypothyroidism   Abnormal urinalysis   Hypokalemia   Protein-calorie malnutrition, severe   Bleeding gastrointestinal    Results:   Labs: Results for orders placed or performed during the hospital encounter of 12/05/14 (from the past 48 hour(s))  CBC     Status: Abnormal   Collection Time: 12/09/14  3:12 AM  Result Value Ref Range   WBC 8.8 4.0 - 10.5 K/uL   RBC 2.08 (L) 3.87 - 5.11 MIL/uL   Hemoglobin 6.2 (LL) 12.0 - 15.0 g/dL    Comment: REPEATED TO VERIFY CRITICAL RESULT CALLED TO, READ BACK BY AND VERIFIED WITH: A. KNEE RN  [id number] GREEN R    HCT 18.1 (L) 36.0 - 46.0 %   MCV 87.0 78.0 - 100.0 fL   MCH 29.8 26.0 - 34.0 pg   MCHC 34.3 30.0 - 36.0 g/dL   RDW 14.8 11.5 - 15.5 %   Platelets 128 (L) 150 - 400 K/uL  Basic metabolic panel     Status: Abnormal   Collection Time: 12/09/14  3:12 AM  Result Value Ref Range   Sodium 142 135 - 145 mmol/L   Potassium 4.2 3.5 - 5.1 mmol/L   Chloride 119 (H) 96 - 112 mmol/L   CO2 20 19 - 32 mmol/L   Glucose, Bld 114 (H) 70 - 99 mg/dL   BUN 39 (H) 6 - 23 mg/dL   Creatinine, Ser 1.24 (H) 0.50 - 1.10 mg/dL   Calcium 7.3 (L) 8.4 - 10.5 mg/dL   GFR calc non Af Amer 37 (L) >90 mL/min   GFR calc Af Amer 43 (L) >90 mL/min    Comment: (NOTE) The eGFR has been calculated using the CKD EPI equation. This calculation has not been validated in all clinical situations. eGFR's persistently <90 mL/min signify possible Chronic Kidney Disease.    Anion gap 3 (L) 5 - 15  Prepare RBC     Status: None  Collection Time: 12/09/14  6:12 AM  Result Value Ref Range   Order Confirmation ORDER PROCESSED BY BLOOD BANK   Type and screen     Status: None (Preliminary result)   Collection Time: 12/09/14  6:12 AM  Result Value Ref Range   ABO/RH(D) O POS    Antibody Screen NEG    Sample Expiration 12/12/2014    Unit Number [id number]    Blood Component Type RED CELLS,LR    Unit division 00    Status of Unit ISSUED    Transfusion Status OK TO TRANSFUSE    Crossmatch Result Compatible    Unit Number [id number]    Blood Component Type RED CELLS,LR    Unit division 00    Status of Unit ISSUED    Transfusion Status OK TO TRANSFUSE    Crossmatch Result Compatible    Unit Number [id number]    Blood Component Type RBC LR PHER2    Unit division 00    Status of Unit ISSUED    Transfusion Status OK TO TRANSFUSE    Crossmatch Result Compatible    Unit Number [id number]    Blood Component Type RED CELLS,LR    Unit division 00    Status of Unit ISSUED    Transfusion Status  OK TO TRANSFUSE    Crossmatch Result Compatible    Unit Number [id number]    Blood Component Type RBC CPDA1, LR    Unit division 00    Status of Unit ISSUED    Transfusion Status OK TO TRANSFUSE    Crossmatch Result Compatible    Unit Number [id number]    Blood Component Type RBC CPDA1, LR    Unit division 00    Status of Unit ISSUED    Transfusion Status OK TO TRANSFUSE    Crossmatch Result Compatible    Unit Number [id number]    Blood Component Type RBC LR PHER2    Unit division 00    Status of Unit ISSUED    Transfusion Status OK TO TRANSFUSE    Crossmatch Result Compatible    Unit Number [id number]    Blood Component Type RED CELLS,LR    Unit division 00    Status of Unit ISSUED    Transfusion Status OK TO TRANSFUSE    Crossmatch Result Compatible    Unit Number [id number]    Blood Component Type RBC CPDA1, LR    Unit division 00    Status of Unit ISSUED    Transfusion Status OK TO TRANSFUSE    Crossmatch Result Compatible    Unit Number [id number]    Blood Component Type RBC CPDA1, LR    Unit division 00    Status of Unit ALLOCATED    Transfusion Status OK TO TRANSFUSE    Crossmatch Result Compatible    Unit Number [id number]    Blood Component Type RED CELLS,LR    Unit division 00    Status of Unit ALLOCATED    Transfusion Status OK TO TRANSFUSE    Crossmatch Result Compatible    Unit Number [id number]    Blood Component Type RED CELLS,LR    Unit division 00    Status of Unit ALLOCATED    Transfusion Status OK TO TRANSFUSE    Crossmatch Result Compatible    Unit Number [id number]    Blood Component Type RED CELLS,LR    Unit division 00    Status of Unit ALLOCATED    Transfusion Status OK TO TRANSFUSE  Crossmatch Result Compatible    Unit Number [id number]    Blood Component Type RED CELLS,LR    Unit division 00    Status of Unit ALLOCATED    Transfusion Status OK TO TRANSFUSE    Crossmatch Result Compatible    Unit  Number [id number]    Blood Component Type RED CELLS,LR    Unit division 00    Status of Unit ALLOCATED    Transfusion Status OK TO TRANSFUSE    Crossmatch Result Compatible   Prepare RBC     Status: None   Collection Time: 12/09/14  1:36 PM  Result Value Ref Range   Order Confirmation ORDER PROCESSED BY BLOOD BANK   I-STAT 7, (LYTES, BLD GAS, ICA, H+H)     Status: Abnormal   Collection Time: 12/09/14  3:52 PM  Result Value Ref Range   pH, Arterial 7.282 (L) 7.350 - 7.450   pCO2 arterial 20.0 (L) 35.0 - 45.0 mmHg   pO2, Arterial 251.0 (H) 80.0 - 100.0 mmHg   Bicarbonate 9.8 (L) 20.0 - 24.0 mEq/L   TCO2 10 0 - 100 mmol/L   O2 Saturation 100.0 %   Acid-base deficit 16.0 (H) 0.0 - 2.0 mmol/L   Sodium 145 135 - 145 mmol/L   Potassium 4.2 3.5 - 5.1 mmol/L   Calcium, Ion 1.01 (L) 1.13 - 1.30 mmol/L   HCT 21.0 (L) 36.0 - 46.0 %   Hemoglobin 7.1 (L) 12.0 - 15.0 g/dL   Patient temperature 34.2 C    Sample type ARTERIAL   I-STAT, chem 8     Status: Abnormal   Collection Time: 12/09/14  3:58 PM  Result Value Ref Range   Sodium 145 135 - 145 mmol/L   Potassium 4.3 3.5 - 5.1 mmol/L   Chloride 117 (H) 96 - 112 mmol/L   BUN 22 6 - 23 mg/dL   Creatinine, Ser 1.00 0.50 - 1.10 mg/dL   Glucose, Bld 298 (H) 70 - 99 mg/dL   Calcium, Ion 1.04 (L) 1.13 - 1.30 mmol/L   TCO2 9 0 - 100 mmol/L   Hemoglobin 7.5 (L) 12.0 - 15.0 g/dL   HCT 22.0 (L) 36.0 - 46.0 %   Comment MD NOTIFIED, REPEAT TEST   Glucose, capillary     Status: Abnormal   Collection Time: 12/09/14  5:09 PM  Result Value Ref Range   Glucose-Capillary 127 (H) 70 - 99 mg/dL  CBC     Status: Abnormal   Collection Time: 12/09/14  8:59 PM  Result Value Ref Range   WBC 19.0 (H) 4.0 - 10.5 K/uL   RBC 6.10 (H) 3.87 - 5.11 MIL/uL   Hemoglobin 18.2 (H) 12.0 - 15.0 g/dL    Comment: REPEATED TO VERIFY RESULTS VERIFIED VIA RECOLLECT DELTA CHECK NOTED CALLED TO MENDY COPPER,RN 2207 12/09/14 WBOND    HCT 52.3 (H) 36.0 - 46.0 %   MCV  85.7 78.0 - 100.0 fL   MCH 29.8 26.0 - 34.0 pg   MCHC 29.8 (L) 30.0 - 36.0 g/dL   RDW 34.8 (H) 11.5 - 15.5 %   Platelets  150 - 400 K/uL    PLATELET CLUMPS NOTED ON SMEAR, COUNT APPEARS DECREASED    Comment: CALLED TO MENDY COPPER,RN 2207 12/09/14 WBOND  I-STAT 3, arterial blood gas (G3+)     Status: Abnormal   Collection Time: 12/09/14  9:09 PM  Result Value Ref Range   pH, Arterial 7.142 (LL) 7.350 - 7.450   pCO2 arterial 20.2 (L)  35.0 - 45.0 mmHg   pO2, Arterial 142.0 (H) 80.0 - 100.0 mmHg   Bicarbonate 7.1 (L) 20.0 - 24.0 mEq/L   TCO2 8 0 - 100 mmol/L   O2 Saturation 99.0 %   Acid-base deficit 20.0 (H) 0.0 - 2.0 mmol/L   Patient temperature 95.3 F    Collection site ARTERIAL LINE    Drawn by Operator    Sample type ARTERIAL    Comment NOTIFIED PHYSICIAN   CBC     Status: Abnormal   Collection Time: 12/09/14 10:30 PM  Result Value Ref Range   WBC 23.0 (H) 4.0 - 10.5 K/uL   RBC 5.92 (H) 3.87 - 5.11 MIL/uL   Hemoglobin 17.5 (H) 12.0 - 15.0 g/dL   HCT 50.3 (H) 36.0 - 46.0 %   MCV 85.0 78.0 - 100.0 fL   MCH 29.6 26.0 - 34.0 pg   MCHC 34.8 30.0 - 36.0 g/dL   RDW 15.3 11.5 - 15.5 %   Platelets  150 - 400 K/uL    PLATELET CLUMPS NOTED ON SMEAR, COUNT APPEARS DECREASED  CBC     Status: Abnormal   Collection Time: 12/10/14  3:20 AM  Result Value Ref Range   WBC 24.9 (H) 4.0 - 10.5 K/uL   RBC 5.55 (H) 3.87 - 5.11 MIL/uL   Hemoglobin 16.6 (H) 12.0 - 15.0 g/dL   HCT 46.2 (H) 36.0 - 46.0 %   MCV 83.2 78.0 - 100.0 fL   MCH 29.9 26.0 - 34.0 pg   MCHC 35.9 30.0 - 36.0 g/dL   RDW 15.4 11.5 - 15.5 %   Platelets 19 (LL) 150 - 400 K/uL    Comment: PLATELET COUNT CONFIRMED BY SMEAR CRITICAL RESULT CALLED TO, READ BACK BY AND VERIFIED WITH: B.SHEPPARD,RN 5597 12/10/14 M.CAMPBELL   Basic metabolic panel     Status: Abnormal   Collection Time: 12/10/14  3:20 AM  Result Value Ref Range   Sodium 143 135 - 145 mmol/L   Potassium 4.9 3.5 - 5.1 mmol/L   Chloride 123 (H) 96 - 112 mmol/L    CO2 13 (L) 19 - 32 mmol/L   Glucose, Bld 188 (H) 70 - 99 mg/dL   BUN 35 (H) 6 - 23 mg/dL    Comment: DELTA CHECK NOTED   Creatinine, Ser 1.69 (H) 0.50 - 1.10 mg/dL    Comment: DELTA CHECK NOTED   Calcium 7.1 (L) 8.4 - 10.5 mg/dL   GFR calc non Af Amer 26 (L) >90 mL/min   GFR calc Af Amer 30 (L) >90 mL/min    Comment: (NOTE) The eGFR has been calculated using the CKD EPI equation. This calculation has not been validated in all clinical situations. eGFR's persistently <90 mL/min signify possible Chronic Kidney Disease.    Anion gap 7 5 - 15  I-STAT 3, arterial blood gas (G3+)     Status: Abnormal   Collection Time: 12/10/14  3:35 AM  Result Value Ref Range   pH, Arterial 7.291 (L) 7.350 - 7.450   pCO2 arterial 22.0 (L) 35.0 - 45.0 mmHg   pO2, Arterial 79.0 (L) 80.0 - 100.0 mmHg   Bicarbonate 10.8 (L) 20.0 - 24.0 mEq/L   TCO2 11 0 - 100 mmol/L   O2 Saturation 95.0 %   Acid-base deficit 14.0 (H) 0.0 - 2.0 mmol/L   Patient temperature 96.5 F    Collection site ARTERIAL LINE    Drawn by Operator    Sample type ARTERIAL     Imaging /  Studies: Ir Angiogram Visceral Selective  12/09/2014   INDICATION: Acute upper GI bleed from duodenal ulcer. Patient is currently hemodynamically unstable.  Note, patient has history of chronic renal insufficiency (preprocedural creatinine -1.2), however the patient was deemed not a surgical candidate given her advanced age. Given her multiple medical comorbidities and unstable state, the decision was made to proceed with potential life saving mesenteric arteriogram and potential percutaneous embolization.  EXAM: 1. ULTRASOUND GUIDANCE FOR ARTERIAL ACCESS 2. CELIAC ARTERIOGRAM 3. GASTRODUODENAL ARTERIOGRAM AND PERCUTANEOUS COIL EMBOLIZATION 4. PANCREATICODUODENAL ARTERIOGRAM AND PERCUTANEOUS COIL EMBOLIZATION  COMPARISON:  NONE  MEDICATIONS: Patient continuing monitored and supported by the anesthesia service.  CONTRAST:  45 mL OMNIPAQUE IOHEXOL 300 MG/ML   SOLN  FLUOROSCOPY TIME:  11 minutes.  12 seconds (4,259 mGy)  COMPLICATIONS: None immediate  TECHNIQUE: Informed written consent was obtained from the patient's son after a discussion of the risks, benefits and alternatives to treatment. Questions regarding the procedure were encouraged and answered. A timeout was performed prior to the initiation of the procedure.  The right groin was prepped and draped in the usual sterile fashion, and a sterile drape was applied covering the operative field. Maximum barrier sterile technique with sterile gowns and gloves were used for the procedure. A timeout was performed prior to the initiation of the procedure. Local anesthesia was provided with 1% lidocaine.  The right femoral head was marked fluoroscopically. Under ultrasound guidance, the right common femoral artery was accessed with a micropuncture kit after the overlying soft tissues were anesthetized with 1% lidocaine. An ultrasound image was saved for documentation purposes. The micropuncture sheath was exchanged for a 5 [place] vascular sheath over a Bentson wire. A closure arteriogram was performed through the side of the sheath confirming access within the right common femoral artery.  Over a Bentson wire, a Mickelson catheter was advanced to the level of the thoracic aorta where it was back bled and flushed. The catheter was then utilized to select the celiac artery. A celiac arteriogram was performed.  With the use of a synchro 14 micro catheter, a regular Renegade micro catheter was advanced through the gastroduodenal artery into the extravasated duodenal ulcer. Limited contrast injection confirmed appropriate positioning and the GDA was back coiled to near its origin.  Post embolization arteriogram demonstrated a hypertrophied pancreaticoduodenal branch which appeared to provide collateral supply to the area of extravasation within the duodenum. As such, the micro catheter was advanced into the distal aspect of  the pancreaticoduodenal artery. Contrast injection confirmed appropriate positioning and the vessel was back coiled to near its origin.  The microcatheter was retracted into the proper hepatic artery and a proper hepatic arteriogram was performed. The micro catheter was removed and a completion celiac arteriogram was performed via the [facility] catheter. Images were reviewed and the procedure was terminated.  All wires, catheters and sheaths were removed from the patient. Hemostasis was achieved at the right groin access site with deployment of an Exoseal closure device. A dressing was placed. The patient tolerated the procedure well without immediate postprocedural complication and was noted to be hemodynamically stable at the time of procedure completion.  FINDINGS: Celiac arteriogram demonstrates frank extravasation from a branch arising from the mid aspect of the GDA. All branches of the celiac artery appear diffusely spasmodic compatible with hypovolemic state.  With the use of a a micro wire, the micro catheter was advanced into the area of active extravasation and the GDA was subsequently back coiled to near its origin.  Post GDA arteriogram demonstrates potential persistent ill-defined supply to the descending duodenum via hypertrophied blanch of the pancreaticoduodenal artery. Note was also made of retrograde supply to the proximal SMA. As such, this branch was cannulated and also back coiled to near its origin.  Completion arteriograms performed via the micro catheter from the common hepatic and via the Mickelson catheter from the celiac did not demonstrate any persistent supply to the area of active extravasation within the descending segment of the duodenum.  Completion superior mesenteric arteriogram was not performed given patient's renal insufficiency  IMPRESSION: Technically successful percutaneous coil embolization of the GDA and a hypertrophied pancreaticoduodenal artery for active extravasation.    Electronically Signed   By: Sandi Mariscal M.D.   On: 12/09/2014 17:27   Ir Angiogram Selective Each Additional Vessel  12/09/2014   INDICATION: Acute upper GI bleed from duodenal ulcer. Patient is currently hemodynamically unstable.  Note, patient has history of chronic renal insufficiency (preprocedural creatinine -1.2), however the patient was deemed not a surgical candidate given her advanced age. Given her multiple medical comorbidities and unstable state, the decision was made to proceed with potential life saving mesenteric arteriogram and potential percutaneous embolization.  EXAM: 1. ULTRASOUND GUIDANCE FOR ARTERIAL ACCESS 2. CELIAC ARTERIOGRAM 3. GASTRODUODENAL ARTERIOGRAM AND PERCUTANEOUS COIL EMBOLIZATION 4. PANCREATICODUODENAL ARTERIOGRAM AND PERCUTANEOUS COIL EMBOLIZATION  COMPARISON:  NONE  MEDICATIONS: Patient continuing monitored and supported by the anesthesia service.  CONTRAST:  45 mL OMNIPAQUE IOHEXOL 300 MG/ML  SOLN  FLUOROSCOPY TIME:  11 minutes.  12 seconds (8,527 mGy)  COMPLICATIONS: None immediate  TECHNIQUE: Informed written consent was obtained from the patient's son after a discussion of the risks, benefits and alternatives to treatment. Questions regarding the procedure were encouraged and answered. A timeout was performed prior to the initiation of the procedure.  The right groin was prepped and draped in the usual sterile fashion, and a sterile drape was applied covering the operative field. Maximum barrier sterile technique with sterile gowns and gloves were used for the procedure. A timeout was performed prior to the initiation of the procedure. Local anesthesia was provided with 1% lidocaine.  The right femoral head was marked fluoroscopically. Under ultrasound guidance, the right common femoral artery was accessed with a micropuncture kit after the overlying soft tissues were anesthetized with 1% lidocaine. An ultrasound image was saved for documentation purposes. The  micropuncture sheath was exchanged for a 5 [place] vascular sheath over a Bentson wire. A closure arteriogram was performed through the side of the sheath confirming access within the right common femoral artery.  Over a Bentson wire, a Mickelson catheter was advanced to the level of the thoracic aorta where it was back bled and flushed. The catheter was then utilized to select the celiac artery. A celiac arteriogram was performed.  With the use of a synchro 14 micro catheter, a regular Renegade micro catheter was advanced through the gastroduodenal artery into the extravasated duodenal ulcer. Limited contrast injection confirmed appropriate positioning and the GDA was back coiled to near its origin.  Post embolization arteriogram demonstrated a hypertrophied pancreaticoduodenal branch which appeared to provide collateral supply to the area of extravasation within the duodenum. As such, the micro catheter was advanced into the distal aspect of the pancreaticoduodenal artery. Contrast injection confirmed appropriate positioning and the vessel was back coiled to near its origin.  The microcatheter was retracted into the proper hepatic artery and a proper hepatic arteriogram was performed. The micro catheter was removed and a completion  celiac arteriogram was performed via the [facility] catheter. Images were reviewed and the procedure was terminated.  All wires, catheters and sheaths were removed from the patient. Hemostasis was achieved at the right groin access site with deployment of an Exoseal closure device. A dressing was placed. The patient tolerated the procedure well without immediate postprocedural complication and was noted to be hemodynamically stable at the time of procedure completion.  FINDINGS: Celiac arteriogram demonstrates frank extravasation from a branch arising from the mid aspect of the GDA. All branches of the celiac artery appear diffusely spasmodic compatible with hypovolemic state.  With the  use of a a micro wire, the micro catheter was advanced into the area of active extravasation and the GDA was subsequently back coiled to near its origin.  Post GDA arteriogram demonstrates potential persistent ill-defined supply to the descending duodenum via hypertrophied blanch of the pancreaticoduodenal artery. Note was also made of retrograde supply to the proximal SMA. As such, this branch was cannulated and also back coiled to near its origin.  Completion arteriograms performed via the micro catheter from the common hepatic and via the Mickelson catheter from the celiac did not demonstrate any persistent supply to the area of active extravasation within the descending segment of the duodenum.  Completion superior mesenteric arteriogram was not performed given patient's renal insufficiency  IMPRESSION: Technically successful percutaneous coil embolization of the GDA and a hypertrophied pancreaticoduodenal artery for active extravasation.   Electronically Signed   By: Sandi Mariscal M.D.   On: 12/09/2014 17:27   Ir Angiogram Selective Each Additional Vessel  12/09/2014   INDICATION: Acute upper GI bleed from duodenal ulcer. Patient is currently hemodynamically unstable.  Note, patient has history of chronic renal insufficiency (preprocedural creatinine -1.2), however the patient was deemed not a surgical candidate given her advanced age. Given her multiple medical comorbidities and unstable state, the decision was made to proceed with potential life saving mesenteric arteriogram and potential percutaneous embolization.  EXAM: 1. ULTRASOUND GUIDANCE FOR ARTERIAL ACCESS 2. CELIAC ARTERIOGRAM 3. GASTRODUODENAL ARTERIOGRAM AND PERCUTANEOUS COIL EMBOLIZATION 4. PANCREATICODUODENAL ARTERIOGRAM AND PERCUTANEOUS COIL EMBOLIZATION  COMPARISON:  NONE  MEDICATIONS: Patient continuing monitored and supported by the anesthesia service.  CONTRAST:  45 mL OMNIPAQUE IOHEXOL 300 MG/ML  SOLN  FLUOROSCOPY TIME:  11 minutes.  12  seconds (1,157 mGy)  COMPLICATIONS: None immediate  TECHNIQUE: Informed written consent was obtained from the patient's son after a discussion of the risks, benefits and alternatives to treatment. Questions regarding the procedure were encouraged and answered. A timeout was performed prior to the initiation of the procedure.  The right groin was prepped and draped in the usual sterile fashion, and a sterile drape was applied covering the operative field. Maximum barrier sterile technique with sterile gowns and gloves were used for the procedure. A timeout was performed prior to the initiation of the procedure. Local anesthesia was provided with 1% lidocaine.  The right femoral head was marked fluoroscopically. Under ultrasound guidance, the right common femoral artery was accessed with a micropuncture kit after the overlying soft tissues were anesthetized with 1% lidocaine. An ultrasound image was saved for documentation purposes. The micropuncture sheath was exchanged for a 5 [place] vascular sheath over a Bentson wire. A closure arteriogram was performed through the side of the sheath confirming access within the right common femoral artery.  Over a Bentson wire, a Mickelson catheter was advanced to the level of the thoracic aorta where it was back bled and flushed. The catheter  was then utilized to select the celiac artery. A celiac arteriogram was performed.  With the use of a synchro 14 micro catheter, a regular Renegade micro catheter was advanced through the gastroduodenal artery into the extravasated duodenal ulcer. Limited contrast injection confirmed appropriate positioning and the GDA was back coiled to near its origin.  Post embolization arteriogram demonstrated a hypertrophied pancreaticoduodenal branch which appeared to provide collateral supply to the area of extravasation within the duodenum. As such, the micro catheter was advanced into the distal aspect of the pancreaticoduodenal artery. Contrast  injection confirmed appropriate positioning and the vessel was back coiled to near its origin.  The microcatheter was retracted into the proper hepatic artery and a proper hepatic arteriogram was performed. The micro catheter was removed and a completion celiac arteriogram was performed via the [facility] catheter. Images were reviewed and the procedure was terminated.  All wires, catheters and sheaths were removed from the patient. Hemostasis was achieved at the right groin access site with deployment of an Exoseal closure device. A dressing was placed. The patient tolerated the procedure well without immediate postprocedural complication and was noted to be hemodynamically stable at the time of procedure completion.  FINDINGS: Celiac arteriogram demonstrates frank extravasation from a branch arising from the mid aspect of the GDA. All branches of the celiac artery appear diffusely spasmodic compatible with hypovolemic state.  With the use of a a micro wire, the micro catheter was advanced into the area of active extravasation and the GDA was subsequently back coiled to near its origin.  Post GDA arteriogram demonstrates potential persistent ill-defined supply to the descending duodenum via hypertrophied blanch of the pancreaticoduodenal artery. Note was also made of retrograde supply to the proximal SMA. As such, this branch was cannulated and also back coiled to near its origin.  Completion arteriograms performed via the micro catheter from the common hepatic and via the Mickelson catheter from the celiac did not demonstrate any persistent supply to the area of active extravasation within the descending segment of the duodenum.  Completion superior mesenteric arteriogram was not performed given patient's renal insufficiency  IMPRESSION: Technically successful percutaneous coil embolization of the GDA and a hypertrophied pancreaticoduodenal artery for active extravasation.   Electronically Signed   By: Sandi Mariscal  M.D.   On: 12/09/2014 17:27   Ir Angiogram Follow Up Study  12/09/2014   INDICATION: Acute upper GI bleed from duodenal ulcer. Patient is currently hemodynamically unstable.  Note, patient has history of chronic renal insufficiency (preprocedural creatinine -1.2), however the patient was deemed not a surgical candidate given her advanced age. Given her multiple medical comorbidities and unstable state, the decision was made to proceed with potential life saving mesenteric arteriogram and potential percutaneous embolization.  EXAM: 1. ULTRASOUND GUIDANCE FOR ARTERIAL ACCESS 2. CELIAC ARTERIOGRAM 3. GASTRODUODENAL ARTERIOGRAM AND PERCUTANEOUS COIL EMBOLIZATION 4. PANCREATICODUODENAL ARTERIOGRAM AND PERCUTANEOUS COIL EMBOLIZATION  COMPARISON:  NONE  MEDICATIONS: Patient continuing monitored and supported by the anesthesia service.  CONTRAST:  45 mL OMNIPAQUE IOHEXOL 300 MG/ML  SOLN  FLUOROSCOPY TIME:  11 minutes.  12 seconds (9,417 mGy)  COMPLICATIONS: None immediate  TECHNIQUE: Informed written consent was obtained from the patient's son after a discussion of the risks, benefits and alternatives to treatment. Questions regarding the procedure were encouraged and answered. A timeout was performed prior to the initiation of the procedure.  The right groin was prepped and draped in the usual sterile fashion, and a sterile drape was applied covering the operative field.  Maximum barrier sterile technique with sterile gowns and gloves were used for the procedure. A timeout was performed prior to the initiation of the procedure. Local anesthesia was provided with 1% lidocaine.  The right femoral head was marked fluoroscopically. Under ultrasound guidance, the right common femoral artery was accessed with a micropuncture kit after the overlying soft tissues were anesthetized with 1% lidocaine. An ultrasound image was saved for documentation purposes. The micropuncture sheath was exchanged for a 5 [place] vascular sheath  over a Bentson wire. A closure arteriogram was performed through the side of the sheath confirming access within the right common femoral artery.  Over a Bentson wire, a Mickelson catheter was advanced to the level of the thoracic aorta where it was back bled and flushed. The catheter was then utilized to select the celiac artery. A celiac arteriogram was performed.  With the use of a synchro 14 micro catheter, a regular Renegade micro catheter was advanced through the gastroduodenal artery into the extravasated duodenal ulcer. Limited contrast injection confirmed appropriate positioning and the GDA was back coiled to near its origin.  Post embolization arteriogram demonstrated a hypertrophied pancreaticoduodenal branch which appeared to provide collateral supply to the area of extravasation within the duodenum. As such, the micro catheter was advanced into the distal aspect of the pancreaticoduodenal artery. Contrast injection confirmed appropriate positioning and the vessel was back coiled to near its origin.  The microcatheter was retracted into the proper hepatic artery and a proper hepatic arteriogram was performed. The micro catheter was removed and a completion celiac arteriogram was performed via the [facility] catheter. Images were reviewed and the procedure was terminated.  All wires, catheters and sheaths were removed from the patient. Hemostasis was achieved at the right groin access site with deployment of an Exoseal closure device. A dressing was placed. The patient tolerated the procedure well without immediate postprocedural complication and was noted to be hemodynamically stable at the time of procedure completion.  FINDINGS: Celiac arteriogram demonstrates frank extravasation from a branch arising from the mid aspect of the GDA. All branches of the celiac artery appear diffusely spasmodic compatible with hypovolemic state.  With the use of a a micro wire, the micro catheter was advanced into the  area of active extravasation and the GDA was subsequently back coiled to near its origin.  Post GDA arteriogram demonstrates potential persistent ill-defined supply to the descending duodenum via hypertrophied blanch of the pancreaticoduodenal artery. Note was also made of retrograde supply to the proximal SMA. As such, this branch was cannulated and also back coiled to near its origin.  Completion arteriograms performed via the micro catheter from the common hepatic and via the Mickelson catheter from the celiac did not demonstrate any persistent supply to the area of active extravasation within the descending segment of the duodenum.  Completion superior mesenteric arteriogram was not performed given patient's renal insufficiency  IMPRESSION: Technically successful percutaneous coil embolization of the GDA and a hypertrophied pancreaticoduodenal artery for active extravasation.   Electronically Signed   By: Sandi Mariscal M.D.   On: 12/09/2014 17:27   Ir US Guide Vasc Access Right  12/09/2014   INDICATION: Acute upper GI bleed from duodenal ulcer. Patient is currently hemodynamically unstable.  Note, patient has history of chronic renal insufficiency (preprocedural creatinine -1.2), however the patient was deemed not a surgical candidate given her advanced age. Given her multiple medical comorbidities and unstable state, the decision was made to proceed with potential life saving mesenteric arteriogram and  potential percutaneous embolization.  EXAM: 1. ULTRASOUND GUIDANCE FOR ARTERIAL ACCESS 2. CELIAC ARTERIOGRAM 3. GASTRODUODENAL ARTERIOGRAM AND PERCUTANEOUS COIL EMBOLIZATION 4. PANCREATICODUODENAL ARTERIOGRAM AND PERCUTANEOUS COIL EMBOLIZATION  COMPARISON:  NONE  MEDICATIONS: Patient continuing monitored and supported by the anesthesia service.  CONTRAST:  45 mL OMNIPAQUE IOHEXOL 300 MG/ML  SOLN  FLUOROSCOPY TIME:  11 minutes.  12 seconds (7,026 mGy)  COMPLICATIONS: None immediate  TECHNIQUE: Informed written  consent was obtained from the patient's son after a discussion of the risks, benefits and alternatives to treatment. Questions regarding the procedure were encouraged and answered. A timeout was performed prior to the initiation of the procedure.  The right groin was prepped and draped in the usual sterile fashion, and a sterile drape was applied covering the operative field. Maximum barrier sterile technique with sterile gowns and gloves were used for the procedure. A timeout was performed prior to the initiation of the procedure. Local anesthesia was provided with 1% lidocaine.  The right femoral head was marked fluoroscopically. Under ultrasound guidance, the right common femoral artery was accessed with a micropuncture kit after the overlying soft tissues were anesthetized with 1% lidocaine. An ultrasound image was saved for documentation purposes. The micropuncture sheath was exchanged for a 5 [place] vascular sheath over a Bentson wire. A closure arteriogram was performed through the side of the sheath confirming access within the right common femoral artery.  Over a Bentson wire, a Mickelson catheter was advanced to the level of the thoracic aorta where it was back bled and flushed. The catheter was then utilized to select the celiac artery. A celiac arteriogram was performed.  With the use of a synchro 14 micro catheter, a regular Renegade micro catheter was advanced through the gastroduodenal artery into the extravasated duodenal ulcer. Limited contrast injection confirmed appropriate positioning and the GDA was back coiled to near its origin.  Post embolization arteriogram demonstrated a hypertrophied pancreaticoduodenal branch which appeared to provide collateral supply to the area of extravasation within the duodenum. As such, the micro catheter was advanced into the distal aspect of the pancreaticoduodenal artery. Contrast injection confirmed appropriate positioning and the vessel was back coiled to near  its origin.  The microcatheter was retracted into the proper hepatic artery and a proper hepatic arteriogram was performed. The micro catheter was removed and a completion celiac arteriogram was performed via the [facility] catheter. Images were reviewed and the procedure was terminated.  All wires, catheters and sheaths were removed from the patient. Hemostasis was achieved at the right groin access site with deployment of an Exoseal closure device. A dressing was placed. The patient tolerated the procedure well without immediate postprocedural complication and was noted to be hemodynamically stable at the time of procedure completion.  FINDINGS: Celiac arteriogram demonstrates frank extravasation from a branch arising from the mid aspect of the GDA. All branches of the celiac artery appear diffusely spasmodic compatible with hypovolemic state.  With the use of a a micro wire, the micro catheter was advanced into the area of active extravasation and the GDA was subsequently back coiled to near its origin.  Post GDA arteriogram demonstrates potential persistent ill-defined supply to the descending duodenum via hypertrophied blanch of the pancreaticoduodenal artery. Note was also made of retrograde supply to the proximal SMA. As such, this branch was cannulated and also back coiled to near its origin.  Completion arteriograms performed via the micro catheter from the common hepatic and via the Mickelson catheter from the celiac did not demonstrate  any persistent supply to the area of active extravasation within the descending segment of the duodenum.  Completion superior mesenteric arteriogram was not performed given patient's renal insufficiency  IMPRESSION: Technically successful percutaneous coil embolization of the GDA and a hypertrophied pancreaticoduodenal artery for active extravasation.   Electronically Signed   By: Sandi Mariscal M.D.   On: 12/09/2014 17:27   [place] Guide  Roadmapping  12/09/2014   INDICATION: Acute upper GI bleed from duodenal ulcer. Patient is currently hemodynamically unstable.  Note, patient has history of chronic renal insufficiency (preprocedural creatinine -1.2), however the patient was deemed not a surgical candidate given her advanced age. Given her multiple medical comorbidities and unstable state, the decision was made to proceed with potential life saving mesenteric arteriogram and potential percutaneous embolization.  EXAM: 1. ULTRASOUND GUIDANCE FOR ARTERIAL ACCESS 2. CELIAC ARTERIOGRAM 3. GASTRODUODENAL ARTERIOGRAM AND PERCUTANEOUS COIL EMBOLIZATION 4. PANCREATICODUODENAL ARTERIOGRAM AND PERCUTANEOUS COIL EMBOLIZATION  COMPARISON:  NONE  MEDICATIONS: Patient continuing monitored and supported by the anesthesia service.  CONTRAST:  45 mL OMNIPAQUE IOHEXOL 300 MG/ML  SOLN  FLUOROSCOPY TIME:  11 minutes.  12 seconds (8,563 mGy)  COMPLICATIONS: None immediate  TECHNIQUE: Informed written consent was obtained from the patient's son after a discussion of the risks, benefits and alternatives to treatment. Questions regarding the procedure were encouraged and answered. A timeout was performed prior to the initiation of the procedure.  The right groin was prepped and draped in the usual sterile fashion, and a sterile drape was applied covering the operative field. Maximum barrier sterile technique with sterile gowns and gloves were used for the procedure. A timeout was performed prior to the initiation of the procedure. Local anesthesia was provided with 1% lidocaine.  The right femoral head was marked fluoroscopically. Under ultrasound guidance, the right common femoral artery was accessed with a micropuncture kit after the overlying soft tissues were anesthetized with 1% lidocaine. An ultrasound image was saved for documentation purposes. The micropuncture sheath was exchanged for a 5 [place] vascular sheath over a Bentson wire. A closure arteriogram was  performed through the side of the sheath confirming access within the right common femoral artery.  Over a Bentson wire, a Mickelson catheter was advanced to the level of the thoracic aorta where it was back bled and flushed. The catheter was then utilized to select the celiac artery. A celiac arteriogram was performed.  With the use of a synchro 14 micro catheter, a regular Renegade micro catheter was advanced through the gastroduodenal artery into the extravasated duodenal ulcer. Limited contrast injection confirmed appropriate positioning and the GDA was back coiled to near its origin.  Post embolization arteriogram demonstrated a hypertrophied pancreaticoduodenal branch which appeared to provide collateral supply to the area of extravasation within the duodenum. As such, the micro catheter was advanced into the distal aspect of the pancreaticoduodenal artery. Contrast injection confirmed appropriate positioning and the vessel was back coiled to near its origin.  The microcatheter was retracted into the proper hepatic artery and a proper hepatic arteriogram was performed. The micro catheter was removed and a completion celiac arteriogram was performed via the [facility] catheter. Images were reviewed and the procedure was terminated.  All wires, catheters and sheaths were removed from the patient. Hemostasis was achieved at the right groin access site with deployment of an Exoseal closure device. A dressing was placed. The patient tolerated the procedure well without immediate postprocedural complication and was noted to  be hemodynamically stable at the time of procedure completion.  FINDINGS: Celiac arteriogram demonstrates frank extravasation from a branch arising from the mid aspect of the GDA. All branches of the celiac artery appear diffusely spasmodic compatible with hypovolemic state.  With the use of a a micro wire, the micro catheter was advanced into the area of active extravasation and the GDA was  subsequently back coiled to near its origin.  Post GDA arteriogram demonstrates potential persistent ill-defined supply to the descending duodenum via hypertrophied blanch of the pancreaticoduodenal artery. Note was also made of retrograde supply to the proximal SMA. As such, this branch was cannulated and also back coiled to near its origin.  Completion arteriograms performed via the micro catheter from the common hepatic and via the Mickelson catheter from the celiac did not demonstrate any persistent supply to the area of active extravasation within the descending segment of the duodenum.  Completion superior mesenteric arteriogram was not performed given patient's renal insufficiency  IMPRESSION: Technically successful percutaneous coil embolization of the GDA and a hypertrophied pancreaticoduodenal artery for active extravasation.   Electronically Signed   By: Sandi Mariscal M.D.   On: 12/09/2014 17:27    Medications / Allergies:  Scheduled Meds: . sodium chloride   Intravenous Once  . antiseptic oral rinse  7 mL Mouth Rinse q12n4p  . chlorhexidine  15 mL Mouth Rinse BID  . levothyroxine  12.5 mcg Intravenous Daily  . sodium chloride  10 mL Intravenous Q12H  . sodium chloride  3 mL Intravenous Q12H   Continuous Infusions: . sodium chloride 10 mL/hr at 12/09/14 2345  . pantoprozole (PROTONIX) infusion 8 mg/hr (12/10/14 0752)  .  sodium bicarbonate 150 mEq in sterile water 1000 mL infusion 50 mL/hr at 12/09/14 2345   PRN Meds:.acetaminophen, morphine injection, ondansetron (ZOFRAN) IV  Antibiotics: Anti-infectives    Start     Dose/Rate Route Frequency Ordered Stop   12/08/14 1700  cefUROXime (CEFTIN) tablet 500 mg  Status:  Discontinued     500 mg Oral 2 times daily with meals 12/08/14 1120 12/09/14 1901   12/05/14 1200  cefTRIAXone (ROCEPHIN) 1 g in dextrose 5 % 50 mL IVPB  Status:  Discontinued     1 g 100 mL/hr over 30 Minutes Intravenous Every 24 hours 12/05/14 1148 12/08/14 1120         Assessment/Plan Bleeding duodenal ulcer Anemia  Hemodynamically stable.  Bloody BMs is likely the old blood that's passing.  H&h are high, platelets are low, question lab accuracy/concentration.  q6h CBC Would like to avoid surgery given her large ventral hernia.  Will monitor closely for signs of bleeding  Erby Pian, [facility] Surgery Pager [phone]([id number]:30P) For consults and floor pages call [phone](7A-4:30P)  12/10/2014 8:05 AM

## 2014-12-10 NOTE — Addendum Note (Signed)
Addendum  created 12/10/14 1425 by Kipp Broodavid Kellene Mccleary, MD   Modules edited: Orders

## 2014-12-10 NOTE — Consult Note (Signed)
PULMONARY / CRITICAL CARE MEDICINE   Name: Sheila Murphy MRN: [id number] DOB: 01-Aug-1922    ADMISSION DATE:  12/05/2014 CONSULTATION DATE:  12/09/14   REFERRING MD :  Dr Thereasa Solo  CHIEF COMPLAINT:  Upper GIB, hemorrhagic shock  INITIAL PRESENTATION:  81 yo woman, admitted with UGIB and secondary AKI 2/18, found to have a large DU on EGD 2/19. She was treated with PPI and resuscitated. Unfortunately she continued to lose blood, worsening 2/21 pm. Repeat EGD 2/22 showed stomach full of blood - no intervention was possible. She went for IR embolization of GDA on 2/22. She transfers now to ICU for stabilization post-procedure. She received 8u PRBC, 2 prior to the procedure and 6 during embolization. She had periods of hypotension reported while in IR but is hemodynamically stable in the ICU currently.   STUDIES:  EGD 2/19 >> large duodenal ulcer EGD 2/22 >> stomach full of blood, visualization and intervention impossible Arteriogram 2/22 >> culprit vessel embolized via the GDA.   SUBJECTIVE: walked the icu, no bleeding noted today  VITAL SIGNS: Temp:  [95.3 F (35.2 C)-98.4 F (36.9 C)] 98.4 F (36.9 C) (02/23 1156) Pulse Rate:  [27-119] 86 (02/23 1200) Resp:  [16-31] 19 (02/23 1200) BP: (60-169)/(13-148) 150/52 mmHg (02/23 1200) SpO2:  [49 %-100 %] 100 % (02/23 1200) Arterial Line BP: (123-202)/(50-83) 179/69 mmHg (02/23 1200) Weight:  [57.6 kg (126 lb 15.8 oz)-58 kg (127 lb 13.9 oz)] 58 kg (127 lb 13.9 oz) (02/23 0400) HEMODYNAMICS:   VENTILATOR SETTINGS:   INTAKE / OUTPUT:  Intake/Output Summary (Last 24 hours) at 12/10/14 1249 Last data filed at 12/10/14 1200  Gross per 24 hour  Intake 5252.66 ml  Output    750 ml  Net 4502.66 ml    PHYSICAL EXAMINATION: General:  Elderly woman, laying flat, comfortable, walked earlier Neuro:  Awake, alert, interacting, answers questions, moves extremities, walked well with assistance HEENT:  OP dry Cardiovascular:  Regular, tachy s1 s2    Lungs:  s1 s2 rrr Abdomen:  Old surgical scars, large midline ventral hernia, non-tender to palp Musculoskeletal:  No edema Skin:  No rash  LABS:  CBC  Recent Labs Lab 12/09/14 2230 12/10/14 0320 12/10/14 1100  WBC 23.0* 24.9* 22.3*  HGB 17.5* 16.6* 14.8  HCT 50.3* 46.2* 41.1  PLT PLATELET CLUMPS NOTED ON SMEAR, COUNT APPEARS DECREASED 19* PENDING   Coag's  Recent Labs Lab 12/06/14 0415  APTT 27  INR 1.32   BMET  Recent Labs Lab 12/08/14 0330 12/09/14 0312 12/09/14 1552 12/09/14 1558 12/10/14 0320  NA 142 142 145 145 143  K 3.9 4.2 4.2 4.3 4.9  CL 115* 119*  --  117* 123*  CO2 23 20  --   --  13*  BUN 35* 39*  --  22 35*  CREATININE 1.30* 1.24*  --  1.00 1.69*  GLUCOSE 90 114*  --  298* 188*   Electrolytes  Recent Labs Lab 12/06/14 0415  12/08/14 0330 12/09/14 0312 12/10/14 0320  CALCIUM 8.7  < > 8.3* 7.3* 7.1*  MG 1.9  --   --   --   --   < > = values in this interval not displayed. Sepsis Markers No results for input(s): LATICACIDVEN, PROCALCITON, O2SATVEN in the last 168 hours. ABG  Recent Labs Lab 12/09/14 1552 12/09/14 2109 12/10/14 0335  PHART 7.282* 7.142* 7.291*  PCO2ART 20.0* 20.2* 22.0*  PO2ART 251.0* 142.0* 79.0*   Liver Enzymes  Recent Labs Lab 12/05/14 0933 12/06/14  3354 12/08/14 0330  AST _0 ALT _1 ALKPHOS 43 40 35*  BILITOT 0.9 1.0 0.5  ALBUMIN 2.7* 2.6* 2.6*   Cardiac Enzymes No results for input(s): TROPONINI, PROBNP in the last 168 hours. Glucose  Recent Labs Lab 12/09/14 1709  GLUCAP 127*    Imaging Ir Angiogram Visceral Selective  12/09/2014   INDICATION: Acute upper GI bleed from duodenal ulcer. Patient is currently hemodynamically unstable.  Note, patient has history of chronic renal insufficiency (preprocedural creatinine -1.2), however the patient was deemed not a surgical candidate given her advanced age. Given her multiple medical comorbidities and unstable state, the decision was  made to proceed with potential life saving mesenteric arteriogram and potential percutaneous embolization.  EXAM: 1. ULTRASOUND GUIDANCE FOR ARTERIAL ACCESS 2. CELIAC ARTERIOGRAM 3. GASTRODUODENAL ARTERIOGRAM AND PERCUTANEOUS COIL EMBOLIZATION 4. PANCREATICODUODENAL ARTERIOGRAM AND PERCUTANEOUS COIL EMBOLIZATION  COMPARISON:  NONE  MEDICATIONS: Patient continuing monitored and supported by the anesthesia service.  CONTRAST:  45 mL OMNIPAQUE IOHEXOL 300 MG/ML  SOLN  FLUOROSCOPY TIME:  11 minutes.  12 seconds (5,625 mGy)  COMPLICATIONS: None immediate  TECHNIQUE: Informed written consent was obtained from the patient's son after a discussion of the risks, benefits and alternatives to treatment. Questions regarding the procedure were encouraged and answered. A timeout was performed prior to the initiation of the procedure.  The right groin was prepped and draped in the usual sterile fashion, and a sterile drape was applied covering the operative field. Maximum barrier sterile technique with sterile gowns and gloves were used for the procedure. A timeout was performed prior to the initiation of the procedure. Local anesthesia was provided with 1% lidocaine.  The right femoral head was marked fluoroscopically. Under ultrasound guidance, the right common femoral artery was accessed with a micropuncture kit after the overlying soft tissues were anesthetized with 1% lidocaine. An ultrasound image was saved for documentation purposes. The micropuncture sheath was exchanged for a 5 [place] vascular sheath over a Bentson wire. A closure arteriogram was performed through the side of the sheath confirming access within the right common femoral artery.  Over a Bentson wire, a Mickelson catheter was advanced to the level of the thoracic aorta where it was back bled and flushed. The catheter was then utilized to select the celiac artery. A celiac arteriogram was performed.  With the use of a synchro 14 micro catheter, a regular  Renegade micro catheter was advanced through the gastroduodenal artery into the extravasated duodenal ulcer. Limited contrast injection confirmed appropriate positioning and the GDA was back coiled to near its origin.  Post embolization arteriogram demonstrated a hypertrophied pancreaticoduodenal branch which appeared to provide collateral supply to the area of extravasation within the duodenum. As such, the micro catheter was advanced into the distal aspect of the pancreaticoduodenal artery. Contrast injection confirmed appropriate positioning and the vessel was back coiled to near its origin.  The microcatheter was retracted into the proper hepatic artery and a proper hepatic arteriogram was performed. The micro catheter was removed and a completion celiac arteriogram was performed via the [facility] catheter. Images were reviewed and the procedure was terminated.  All wires, catheters and sheaths were removed from the patient. Hemostasis was achieved at the right groin access site with deployment of an Exoseal closure device. A dressing was placed. The patient tolerated the procedure well without immediate postprocedural complication and was noted to be hemodynamically stable at the time of procedure completion.  FINDINGS: Celiac arteriogram demonstrates frank  extravasation from a branch arising from the mid aspect of the GDA. All branches of the celiac artery appear diffusely spasmodic compatible with hypovolemic state.  With the use of a a micro wire, the micro catheter was advanced into the area of active extravasation and the GDA was subsequently back coiled to near its origin.  Post GDA arteriogram demonstrates potential persistent ill-defined supply to the descending duodenum via hypertrophied blanch of the pancreaticoduodenal artery. Note was also made of retrograde supply to the proximal SMA. As such, this branch was cannulated and also back coiled to near its origin.  Completion arteriograms performed via  the micro catheter from the common hepatic and via the Mickelson catheter from the celiac did not demonstrate any persistent supply to the area of active extravasation within the descending segment of the duodenum.  Completion superior mesenteric arteriogram was not performed given patient's renal insufficiency  IMPRESSION: Technically successful percutaneous coil embolization of the GDA and a hypertrophied pancreaticoduodenal artery for active extravasation.   Electronically Signed   By: Sandi Mariscal M.D.   On: 12/09/2014 17:27   Ir Angiogram Selective Each Additional Vessel  12/09/2014   INDICATION: Acute upper GI bleed from duodenal ulcer. Patient is currently hemodynamically unstable.  Note, patient has history of chronic renal insufficiency (preprocedural creatinine -1.2), however the patient was deemed not a surgical candidate given her advanced age. Given her multiple medical comorbidities and unstable state, the decision was made to proceed with potential life saving mesenteric arteriogram and potential percutaneous embolization.  EXAM: 1. ULTRASOUND GUIDANCE FOR ARTERIAL ACCESS 2. CELIAC ARTERIOGRAM 3. GASTRODUODENAL ARTERIOGRAM AND PERCUTANEOUS COIL EMBOLIZATION 4. PANCREATICODUODENAL ARTERIOGRAM AND PERCUTANEOUS COIL EMBOLIZATION  COMPARISON:  NONE  MEDICATIONS: Patient continuing monitored and supported by the anesthesia service.  CONTRAST:  45 mL OMNIPAQUE IOHEXOL 300 MG/ML  SOLN  FLUOROSCOPY TIME:  11 minutes.  12 seconds (7,939 mGy)  COMPLICATIONS: None immediate  TECHNIQUE: Informed written consent was obtained from the patient's son after a discussion of the risks, benefits and alternatives to treatment. Questions regarding the procedure were encouraged and answered. A timeout was performed prior to the initiation of the procedure.  The right groin was prepped and draped in the usual sterile fashion, and a sterile drape was applied covering the operative field. Maximum barrier sterile technique  with sterile gowns and gloves were used for the procedure. A timeout was performed prior to the initiation of the procedure. Local anesthesia was provided with 1% lidocaine.  The right femoral head was marked fluoroscopically. Under ultrasound guidance, the right common femoral artery was accessed with a micropuncture kit after the overlying soft tissues were anesthetized with 1% lidocaine. An ultrasound image was saved for documentation purposes. The micropuncture sheath was exchanged for a 5 [place] vascular sheath over a Bentson wire. A closure arteriogram was performed through the side of the sheath confirming access within the right common femoral artery.  Over a Bentson wire, a Mickelson catheter was advanced to the level of the thoracic aorta where it was back bled and flushed. The catheter was then utilized to select the celiac artery. A celiac arteriogram was performed.  With the use of a synchro 14 micro catheter, a regular Renegade micro catheter was advanced through the gastroduodenal artery into the extravasated duodenal ulcer. Limited contrast injection confirmed appropriate positioning and the GDA was back coiled to near its origin.  Post embolization arteriogram demonstrated a hypertrophied pancreaticoduodenal branch which appeared to provide collateral supply to the area of extravasation within  the duodenum. As such, the micro catheter was advanced into the distal aspect of the pancreaticoduodenal artery. Contrast injection confirmed appropriate positioning and the vessel was back coiled to near its origin.  The microcatheter was retracted into the proper hepatic artery and a proper hepatic arteriogram was performed. The micro catheter was removed and a completion celiac arteriogram was performed via the [facility] catheter. Images were reviewed and the procedure was terminated.  All wires, catheters and sheaths were removed from the patient. Hemostasis was achieved at the right groin access site with  deployment of an Exoseal closure device. A dressing was placed. The patient tolerated the procedure well without immediate postprocedural complication and was noted to be hemodynamically stable at the time of procedure completion.  FINDINGS: Celiac arteriogram demonstrates frank extravasation from a branch arising from the mid aspect of the GDA. All branches of the celiac artery appear diffusely spasmodic compatible with hypovolemic state.  With the use of a a micro wire, the micro catheter was advanced into the area of active extravasation and the GDA was subsequently back coiled to near its origin.  Post GDA arteriogram demonstrates potential persistent ill-defined supply to the descending duodenum via hypertrophied blanch of the pancreaticoduodenal artery. Note was also made of retrograde supply to the proximal SMA. As such, this branch was cannulated and also back coiled to near its origin.  Completion arteriograms performed via the micro catheter from the common hepatic and via the Mickelson catheter from the celiac did not demonstrate any persistent supply to the area of active extravasation within the descending segment of the duodenum.  Completion superior mesenteric arteriogram was not performed given patient's renal insufficiency  IMPRESSION: Technically successful percutaneous coil embolization of the GDA and a hypertrophied pancreaticoduodenal artery for active extravasation.   Electronically Signed   By: Sandi Mariscal M.D.   On: 12/09/2014 17:27   Ir Angiogram Selective Each Additional Vessel  12/09/2014   INDICATION: Acute upper GI bleed from duodenal ulcer. Patient is currently hemodynamically unstable.  Note, patient has history of chronic renal insufficiency (preprocedural creatinine -1.2), however the patient was deemed not a surgical candidate given her advanced age. Given her multiple medical comorbidities and unstable state, the decision was made to proceed with potential life saving mesenteric  arteriogram and potential percutaneous embolization.  EXAM: 1. ULTRASOUND GUIDANCE FOR ARTERIAL ACCESS 2. CELIAC ARTERIOGRAM 3. GASTRODUODENAL ARTERIOGRAM AND PERCUTANEOUS COIL EMBOLIZATION 4. PANCREATICODUODENAL ARTERIOGRAM AND PERCUTANEOUS COIL EMBOLIZATION  COMPARISON:  NONE  MEDICATIONS: Patient continuing monitored and supported by the anesthesia service.  CONTRAST:  45 mL OMNIPAQUE IOHEXOL 300 MG/ML  SOLN  FLUOROSCOPY TIME:  11 minutes.  12 seconds (7,353 mGy)  COMPLICATIONS: None immediate  TECHNIQUE: Informed written consent was obtained from the patient's son after a discussion of the risks, benefits and alternatives to treatment. Questions regarding the procedure were encouraged and answered. A timeout was performed prior to the initiation of the procedure.  The right groin was prepped and draped in the usual sterile fashion, and a sterile drape was applied covering the operative field. Maximum barrier sterile technique with sterile gowns and gloves were used for the procedure. A timeout was performed prior to the initiation of the procedure. Local anesthesia was provided with 1% lidocaine.  The right femoral head was marked fluoroscopically. Under ultrasound guidance, the right common femoral artery was accessed with a micropuncture kit after the overlying soft tissues were anesthetized with 1% lidocaine. An ultrasound image was saved for documentation purposes. The micropuncture sheath  was exchanged for a 5 [place] vascular sheath over a Bentson wire. A closure arteriogram was performed through the side of the sheath confirming access within the right common femoral artery.  Over a Bentson wire, a Mickelson catheter was advanced to the level of the thoracic aorta where it was back bled and flushed. The catheter was then utilized to select the celiac artery. A celiac arteriogram was performed.  With the use of a synchro 14 micro catheter, a regular Renegade micro catheter was advanced through the  gastroduodenal artery into the extravasated duodenal ulcer. Limited contrast injection confirmed appropriate positioning and the GDA was back coiled to near its origin.  Post embolization arteriogram demonstrated a hypertrophied pancreaticoduodenal branch which appeared to provide collateral supply to the area of extravasation within the duodenum. As such, the micro catheter was advanced into the distal aspect of the pancreaticoduodenal artery. Contrast injection confirmed appropriate positioning and the vessel was back coiled to near its origin.  The microcatheter was retracted into the proper hepatic artery and a proper hepatic arteriogram was performed. The micro catheter was removed and a completion celiac arteriogram was performed via the [facility] catheter. Images were reviewed and the procedure was terminated.  All wires, catheters and sheaths were removed from the patient. Hemostasis was achieved at the right groin access site with deployment of an Exoseal closure device. A dressing was placed. The patient tolerated the procedure well without immediate postprocedural complication and was noted to be hemodynamically stable at the time of procedure completion.  FINDINGS: Celiac arteriogram demonstrates frank extravasation from a branch arising from the mid aspect of the GDA. All branches of the celiac artery appear diffusely spasmodic compatible with hypovolemic state.  With the use of a a micro wire, the micro catheter was advanced into the area of active extravasation and the GDA was subsequently back coiled to near its origin.  Post GDA arteriogram demonstrates potential persistent ill-defined supply to the descending duodenum via hypertrophied blanch of the pancreaticoduodenal artery. Note was also made of retrograde supply to the proximal SMA. As such, this branch was cannulated and also back coiled to near its origin.  Completion arteriograms performed via the micro catheter from the common hepatic and  via the Mickelson catheter from the celiac did not demonstrate any persistent supply to the area of active extravasation within the descending segment of the duodenum.  Completion superior mesenteric arteriogram was not performed given patient's renal insufficiency  IMPRESSION: Technically successful percutaneous coil embolization of the GDA and a hypertrophied pancreaticoduodenal artery for active extravasation.   Electronically Signed   By: Sandi Mariscal M.D.   On: 12/09/2014 17:27   Ir Angiogram Follow Up Study  12/09/2014   INDICATION: Acute upper GI bleed from duodenal ulcer. Patient is currently hemodynamically unstable.  Note, patient has history of chronic renal insufficiency (preprocedural creatinine -1.2), however the patient was deemed not a surgical candidate given her advanced age. Given her multiple medical comorbidities and unstable state, the decision was made to proceed with potential life saving mesenteric arteriogram and potential percutaneous embolization.  EXAM: 1. ULTRASOUND GUIDANCE FOR ARTERIAL ACCESS 2. CELIAC ARTERIOGRAM 3. GASTRODUODENAL ARTERIOGRAM AND PERCUTANEOUS COIL EMBOLIZATION 4. PANCREATICODUODENAL ARTERIOGRAM AND PERCUTANEOUS COIL EMBOLIZATION  COMPARISON:  NONE  MEDICATIONS: Patient continuing monitored and supported by the anesthesia service.  CONTRAST:  45 mL OMNIPAQUE IOHEXOL 300 MG/ML  SOLN  FLUOROSCOPY TIME:  11 minutes.  12 seconds (4,098 mGy)  COMPLICATIONS: None immediate  TECHNIQUE: Informed written consent was obtained  from the patient's son after a discussion of the risks, benefits and alternatives to treatment. Questions regarding the procedure were encouraged and answered. A timeout was performed prior to the initiation of the procedure.  The right groin was prepped and draped in the usual sterile fashion, and a sterile drape was applied covering the operative field. Maximum barrier sterile technique with sterile gowns and gloves were used for the procedure. A  timeout was performed prior to the initiation of the procedure. Local anesthesia was provided with 1% lidocaine.  The right femoral head was marked fluoroscopically. Under ultrasound guidance, the right common femoral artery was accessed with a micropuncture kit after the overlying soft tissues were anesthetized with 1% lidocaine. An ultrasound image was saved for documentation purposes. The micropuncture sheath was exchanged for a 5 [place] vascular sheath over a Bentson wire. A closure arteriogram was performed through the side of the sheath confirming access within the right common femoral artery.  Over a Bentson wire, a Mickelson catheter was advanced to the level of the thoracic aorta where it was back bled and flushed. The catheter was then utilized to select the celiac artery. A celiac arteriogram was performed.  With the use of a synchro 14 micro catheter, a regular Renegade micro catheter was advanced through the gastroduodenal artery into the extravasated duodenal ulcer. Limited contrast injection confirmed appropriate positioning and the GDA was back coiled to near its origin.  Post embolization arteriogram demonstrated a hypertrophied pancreaticoduodenal branch which appeared to provide collateral supply to the area of extravasation within the duodenum. As such, the micro catheter was advanced into the distal aspect of the pancreaticoduodenal artery. Contrast injection confirmed appropriate positioning and the vessel was back coiled to near its origin.  The microcatheter was retracted into the proper hepatic artery and a proper hepatic arteriogram was performed. The micro catheter was removed and a completion celiac arteriogram was performed via the [facility] catheter. Images were reviewed and the procedure was terminated.  All wires, catheters and sheaths were removed from the patient. Hemostasis was achieved at the right groin access site with deployment of an Exoseal closure device. A dressing was  placed. The patient tolerated the procedure well without immediate postprocedural complication and was noted to be hemodynamically stable at the time of procedure completion.  FINDINGS: Celiac arteriogram demonstrates frank extravasation from a branch arising from the mid aspect of the GDA. All branches of the celiac artery appear diffusely spasmodic compatible with hypovolemic state.  With the use of a a micro wire, the micro catheter was advanced into the area of active extravasation and the GDA was subsequently back coiled to near its origin.  Post GDA arteriogram demonstrates potential persistent ill-defined supply to the descending duodenum via hypertrophied blanch of the pancreaticoduodenal artery. Note was also made of retrograde supply to the proximal SMA. As such, this branch was cannulated and also back coiled to near its origin.  Completion arteriograms performed via the micro catheter from the common hepatic and via the Mickelson catheter from the celiac did not demonstrate any persistent supply to the area of active extravasation within the descending segment of the duodenum.  Completion superior mesenteric arteriogram was not performed given patient's renal insufficiency  IMPRESSION: Technically successful percutaneous coil embolization of the GDA and a hypertrophied pancreaticoduodenal artery for active extravasation.   Electronically Signed   By: Sandi Mariscal M.D.   On: 12/09/2014 17:27   Ir US Guide Vasc Access Right  12/09/2014   INDICATION: Acute  upper GI bleed from duodenal ulcer. Patient is currently hemodynamically unstable.  Note, patient has history of chronic renal insufficiency (preprocedural creatinine -1.2), however the patient was deemed not a surgical candidate given her advanced age. Given her multiple medical comorbidities and unstable state, the decision was made to proceed with potential life saving mesenteric arteriogram and potential percutaneous embolization.  EXAM: 1.  ULTRASOUND GUIDANCE FOR ARTERIAL ACCESS 2. CELIAC ARTERIOGRAM 3. GASTRODUODENAL ARTERIOGRAM AND PERCUTANEOUS COIL EMBOLIZATION 4. PANCREATICODUODENAL ARTERIOGRAM AND PERCUTANEOUS COIL EMBOLIZATION  COMPARISON:  NONE  MEDICATIONS: Patient continuing monitored and supported by the anesthesia service.  CONTRAST:  45 mL OMNIPAQUE IOHEXOL 300 MG/ML  SOLN  FLUOROSCOPY TIME:  11 minutes.  12 seconds (6,269 mGy)  COMPLICATIONS: None immediate  TECHNIQUE: Informed written consent was obtained from the patient's son after a discussion of the risks, benefits and alternatives to treatment. Questions regarding the procedure were encouraged and answered. A timeout was performed prior to the initiation of the procedure.  The right groin was prepped and draped in the usual sterile fashion, and a sterile drape was applied covering the operative field. Maximum barrier sterile technique with sterile gowns and gloves were used for the procedure. A timeout was performed prior to the initiation of the procedure. Local anesthesia was provided with 1% lidocaine.  The right femoral head was marked fluoroscopically. Under ultrasound guidance, the right common femoral artery was accessed with a micropuncture kit after the overlying soft tissues were anesthetized with 1% lidocaine. An ultrasound image was saved for documentation purposes. The micropuncture sheath was exchanged for a 5 [place] vascular sheath over a Bentson wire. A closure arteriogram was performed through the side of the sheath confirming access within the right common femoral artery.  Over a Bentson wire, a Mickelson catheter was advanced to the level of the thoracic aorta where it was back bled and flushed. The catheter was then utilized to select the celiac artery. A celiac arteriogram was performed.  With the use of a synchro 14 micro catheter, a regular Renegade micro catheter was advanced through the gastroduodenal artery into the extravasated duodenal ulcer. Limited  contrast injection confirmed appropriate positioning and the GDA was back coiled to near its origin.  Post embolization arteriogram demonstrated a hypertrophied pancreaticoduodenal branch which appeared to provide collateral supply to the area of extravasation within the duodenum. As such, the micro catheter was advanced into the distal aspect of the pancreaticoduodenal artery. Contrast injection confirmed appropriate positioning and the vessel was back coiled to near its origin.  The microcatheter was retracted into the proper hepatic artery and a proper hepatic arteriogram was performed. The micro catheter was removed and a completion celiac arteriogram was performed via the [facility] catheter. Images were reviewed and the procedure was terminated.  All wires, catheters and sheaths were removed from the patient. Hemostasis was achieved at the right groin access site with deployment of an Exoseal closure device. A dressing was placed. The patient tolerated the procedure well without immediate postprocedural complication and was noted to be hemodynamically stable at the time of procedure completion.  FINDINGS: Celiac arteriogram demonstrates frank extravasation from a branch arising from the mid aspect of the GDA. All branches of the celiac artery appear diffusely spasmodic compatible with hypovolemic state.  With the use of a a micro wire, the micro catheter was advanced into the area of active extravasation and the GDA was subsequently back coiled to near its origin.  Post GDA arteriogram demonstrates potential persistent ill-defined supply to the  descending duodenum via hypertrophied blanch of the pancreaticoduodenal artery. Note was also made of retrograde supply to the proximal SMA. As such, this branch was cannulated and also back coiled to near its origin.  Completion arteriograms performed via the micro catheter from the common hepatic and via the Mickelson catheter from the celiac did not demonstrate any  persistent supply to the area of active extravasation within the descending segment of the duodenum.  Completion superior mesenteric arteriogram was not performed given patient's renal insufficiency  IMPRESSION: Technically successful percutaneous coil embolization of the GDA and a hypertrophied pancreaticoduodenal artery for active extravasation.   Electronically Signed   By: Sandi Mariscal M.D.   On: 12/09/2014 17:27   [place] [employer]  12/09/2014   INDICATION: Acute upper GI bleed from duodenal ulcer. Patient is currently hemodynamically unstable.  Note, patient has history of chronic renal insufficiency (preprocedural creatinine -1.2), however the patient was deemed not a surgical candidate given her advanced age. Given her multiple medical comorbidities and unstable state, the decision was made to proceed with potential life saving mesenteric arteriogram and potential percutaneous embolization.  EXAM: 1. ULTRASOUND GUIDANCE FOR ARTERIAL ACCESS 2. CELIAC ARTERIOGRAM 3. GASTRODUODENAL ARTERIOGRAM AND PERCUTANEOUS COIL EMBOLIZATION 4. PANCREATICODUODENAL ARTERIOGRAM AND PERCUTANEOUS COIL EMBOLIZATION  COMPARISON:  NONE  MEDICATIONS: Patient continuing monitored and supported by the anesthesia service.  CONTRAST:  45 mL OMNIPAQUE IOHEXOL 300 MG/ML  SOLN  FLUOROSCOPY TIME:  11 minutes.  12 seconds (7,673 mGy)  COMPLICATIONS: None immediate  TECHNIQUE: Informed written consent was obtained from the patient's son after a discussion of the risks, benefits and alternatives to treatment. Questions regarding the procedure were encouraged and answered. A timeout was performed prior to the initiation of the procedure.  The right groin was prepped and draped in the usual sterile fashion, and a sterile drape was applied covering the operative field. Maximum barrier sterile technique with sterile gowns and gloves were used for the procedure. A timeout was performed prior to the  initiation of the procedure. Local anesthesia was provided with 1% lidocaine.  The right femoral head was marked fluoroscopically. Under ultrasound guidance, the right common femoral artery was accessed with a micropuncture kit after the overlying soft tissues were anesthetized with 1% lidocaine. An ultrasound image was saved for documentation purposes. The micropuncture sheath was exchanged for a 5 [place] vascular sheath over a Bentson wire. A closure arteriogram was performed through the side of the sheath confirming access within the right common femoral artery.  Over a Bentson wire, a Mickelson catheter was advanced to the level of the thoracic aorta where it was back bled and flushed. The catheter was then utilized to select the celiac artery. A celiac arteriogram was performed.  With the use of a synchro 14 micro catheter, a regular Renegade micro catheter was advanced through the gastroduodenal artery into the extravasated duodenal ulcer. Limited contrast injection confirmed appropriate positioning and the GDA was back coiled to near its origin.  Post embolization arteriogram demonstrated a hypertrophied pancreaticoduodenal branch which appeared to provide collateral supply to the area of extravasation within the duodenum. As such, the micro catheter was advanced into the distal aspect of the pancreaticoduodenal artery. Contrast injection confirmed appropriate positioning and the vessel was back coiled to near its origin.  The microcatheter was retracted into the proper hepatic artery and a proper hepatic arteriogram was performed. The micro catheter was removed and a completion celiac arteriogram was performed  via the [facility] catheter. Images were reviewed and the procedure was terminated.  All wires, catheters and sheaths were removed from the patient. Hemostasis was achieved at the right groin access site with deployment of an Exoseal closure device. A dressing was placed. The patient tolerated the  procedure well without immediate postprocedural complication and was noted to be hemodynamically stable at the time of procedure completion.  FINDINGS: Celiac arteriogram demonstrates frank extravasation from a branch arising from the mid aspect of the GDA. All branches of the celiac artery appear diffusely spasmodic compatible with hypovolemic state.  With the use of a a micro wire, the micro catheter was advanced into the area of active extravasation and the GDA was subsequently back coiled to near its origin.  Post GDA arteriogram demonstrates potential persistent ill-defined supply to the descending duodenum via hypertrophied blanch of the pancreaticoduodenal artery. Note was also made of retrograde supply to the proximal SMA. As such, this branch was cannulated and also back coiled to near its origin.  Completion arteriograms performed via the micro catheter from the common hepatic and via the Mickelson catheter from the celiac did not demonstrate any persistent supply to the area of active extravasation within the descending segment of the duodenum.  Completion superior mesenteric arteriogram was not performed given patient's renal insufficiency  IMPRESSION: Technically successful percutaneous coil embolization of the GDA and a hypertrophied pancreaticoduodenal artery for active extravasation.   Electronically Signed   By: Sandi Mariscal M.D.   On: 12/09/2014 17:27     ASSESSMENT / PLAN:  PULMONARY A: No acute issues, at risk for TRALI given blood products, at risk pulm edema (cardiac dz) P:   - check CXR am for edema  CARDIOVASCULAR A: Hemorrhagic shock, currently stable post-IR  Hx HTN Hx AV sclerosis Hx Hyperlipidemia P:  - hold home BP regimen, restart when stabilized - no ASA at this time -tele -cbc to follow  RENAL A:  AKI likely ATN secondary to shock, large NONAG acidosis from stools P:   - follow BMP closely, to q8h with age 33 and clearance as noted -Foley to remain -map  support if needed -would continued bicarb drip  GASTROINTESTINAL A:  Acute Upper GIB due to DU, s/p GDA embolization 2/22 Large ventral hernia P:   - PPI infusion to remain - avoid NSAIDS, etc -full liquids  HEMATOLOGIC A:  Acute blood loss anemia, lowplat x 1 ( error?) P:  - will follow serial CBC q6h - transfusion goal Hgb > 8.0 -repeat coags for consumption -await repeat plat count  INFECTIOUS A: E coli UTI (not fully treated?), leukocytosis (demarg, urine) P:   UC 2/18 >> E coli (R to amp, otherwise sensitive) C diff negative 2/18 Ceftriaxone 2/19 >> 2/21  Repeat UA Dc foley when able Restart cefuroxime goal 7 days in total   ENDOCRINE A:  Hypothyroidism    P:   - synthroid to IV until safe to take PO  NEUROLOGIC A:  Intermittent encephalopathy, ICU delirium P:   RASS goal: 0 -avoid benzo Ambulating will reduce delerium   FAMILY  - Updates: no family available at this time 2/22  - Inter-disciplinary family meet or Palliative Care meeting due by:  2/29  Lavon Paganini. Titus Mould, MD, [place] Pgr: [facility]

## 2014-12-10 NOTE — Evaluation (Signed)
Physical Therapy Evaluation Patient Details Name: Sheila Murphy MRN: [id number] DOB: 1921-11-29 Today's Date: 12/10/2014   History of Present Illness  81 y.o. Female with history of hypertension, hypothyroidism, dyslipidemia, known aortic valve sclerosis, asymptomatic right carotid artery disease, and osteoarthritis on regular NSAIDs who was sent to the emergency room with [facility]BPR. Pt S/P EGD for GI Bleed.  Clinical Impression  Pt very pleasant and soft spoken with several periods of incontinent BM with total assist for pericare during session but pt aware of need to void but without much warning. Pt has 16 children and 2 of which live with her to provide 24hr care. Pt will benefit from acute therapy to maximize mobility, function, gait and independence to return pt to PLOF and decrease burden of care.    Follow Up Recommendations Home health PT;Supervision for mobility/OOB    Equipment Recommendations  None recommended by PT    Recommendations for Other Services       Precautions / Restrictions Precautions Precautions: Fall Restrictions Weight Bearing Restrictions: No      Mobility  Bed Mobility Overal bed mobility: Needs Assistance Bed Mobility: Rolling;Sidelying to Sit Rolling: Min assist Sidelying to sit: Mod assist       General bed mobility comments: cues for sequence with use of rail and assist to rotate pelvis and elevate trunk from surface. Rolling bil for pericare   Transfers Overall transfer level: Needs assistance Equipment used: Rolling walker (2 wheeled) Transfers: Sit to/from Stand Sit to Stand: Min assist         General transfer comment: pulled up on RW despite cues for safety x 2 trials with incontinent bloody BM on initial standing with total assist for pericare  Ambulation/Gait Ambulation/Gait assistance: Min assist Ambulation Distance (Feet): 34 Feet Assistive device: Rolling walker (2 wheeled) Gait Pattern/deviations: Step-through  pattern;Decreased stride length   Gait velocity interpretation: Below normal speed for age/gender General Gait Details: cues for posture and position in RW with chair to follow and assist to steer RW  Stairs            Wheelchair Mobility    Modified Rankin (Stroke Patients Only)       Balance Overall balance assessment: Needs assistance Sitting-balance support: Feet supported Sitting balance-Leahy Scale: Fair       Standing balance-Leahy Scale: Poor                               Pertinent Vitals/Pain Pain Assessment: No/denies pain    Home Living Family/patient expects to be discharged to:: Private residence Living Arrangements: Children (2 sons) Available Help at Discharge: Family;Available 24 hours/day Type of Home: House Home Access: Level entry     Home Layout: One level Home Equipment: Walker - 2 wheels;Cane - single point;Bedside commode;Wheelchair - manual      Prior Function Level of Independence: Needs assistance   Gait / Transfers Assistance Needed: pt states she walks with a cane and handrail in the house and RW out side  ADL's / Homemaking Assistance Needed: pt states she performs ADLs and sons do all the housework and meal prep        Hand Dominance   Dominant Hand: Right    Extremity/Trunk Assessment   Upper Extremity Assessment: Defer to OT evaluation           Lower Extremity Assessment: Generalized weakness      Cervical / Trunk Assessment: Kyphotic  Communication   Communication:  HOH  Cognition Arousal/Alertness: Awake/alert Behavior During Therapy: WFL for tasks assessed/performed Overall Cognitive Status: No family/caregiver present to determine baseline cognitive functioning                      General Comments      Exercises        Assessment/Plan    PT Assessment Patient needs continued PT services  PT Diagnosis Difficulty walking;Generalized weakness   PT Problem List Decreased  strength;Decreased activity tolerance;Decreased mobility;Decreased knowledge of use of DME  PT Treatment Interventions Gait training;DME instruction;Functional mobility training;Therapeutic activities;Patient/family education   PT Goals (Current goals can be found in the Care Plan section) Acute Rehab PT Goals Patient Stated Goal: return home PT Goal Formulation: With patient Time For Goal Achievement: 12/24/14 Potential to Achieve Goals: Fair    Frequency Min 3X/week   Barriers to discharge   pt has sons available 24hrs    Co-evaluation PT/OT/SLP Co-Evaluation/Treatment: Yes Reason for Co-Treatment: For patient/therapist safety PT goals addressed during session: Mobility/safety with mobility;Proper use of DME;Balance OT goals addressed during session: ADL's and self-care       End of Session   Activity Tolerance: Patient tolerated treatment well Patient left: in chair;with call bell/phone within reach Nurse Communication: Mobility status         Time: [id number] PT Time Calculation (min) (ACUTE ONLY): 35 min   Charges:   PT Evaluation $Initial PT Evaluation Tier I: 1 Procedure PT Treatments $Therapeutic Activity: 8-22 mins   PT G CodesDelorse Lek:        Tabor, Soham Hollett Beth 12/10/2014, 12:33 PM Delaney MeigsMaija Tabor Kenley Troop, PT [phone]

## 2014-12-11 ENCOUNTER — Inpatient Hospital Stay (HOSPITAL_COMMUNITY): Payer: Commercial Managed Care - HMO

## 2014-12-11 DIAGNOSIS — K274 Chronic or unspecified peptic ulcer, site unspecified, with hemorrhage: Secondary | ICD-10-CM

## 2014-12-11 LAB — TYPE AND SCREEN
ABO/RH(D): O POS
Antibody Screen: NEGATIVE
Unit division: 0
Unit division: 0
Unit division: 0
Unit division: 0
Unit division: 0
Unit division: 0
Unit division: 0
Unit division: 0
Unit division: 0
Unit division: 0
Unit division: 0
Unit division: 0
Unit division: 0
Unit division: 0
Unit division: 0

## 2014-12-11 LAB — CBC
HCT: 33.5 % — ABNORMAL LOW (ref 36.0–46.0)
HEMATOCRIT: 33 % — AB (ref 36.0–46.0)
HEMOGLOBIN: 11.6 g/dL — AB (ref 12.0–15.0)
Hemoglobin: 11.4 g/dL — ABNORMAL LOW (ref 12.0–15.0)
MCH: 28.9 pg (ref 26.0–34.0)
MCH: 29.4 pg (ref 26.0–34.0)
MCHC: 34.5 g/dL (ref 30.0–36.0)
MCHC: 34.6 g/dL (ref 30.0–36.0)
MCV: 83.5 fL (ref 78.0–100.0)
MCV: 85.1 fL (ref 78.0–100.0)
PLATELETS: 60 10*3/uL — AB (ref 150–400)
Platelets: 74 10*3/uL — ABNORMAL LOW (ref 150–400)
RBC: 3.88 MIL/uL (ref 3.87–5.11)
RBC: 4.01 MIL/uL (ref 3.87–5.11)
RDW: 15.8 % — ABNORMAL HIGH (ref 11.5–15.5)
RDW: 16.1 % — ABNORMAL HIGH (ref 11.5–15.5)
WBC: 19.3 10*3/uL — ABNORMAL HIGH (ref 4.0–10.5)
WBC: 19.4 10*3/uL — AB (ref 4.0–10.5)

## 2014-12-11 LAB — BASIC METABOLIC PANEL
Anion gap: 4 — ABNORMAL LOW (ref 5–15)
Anion gap: 10 (ref 5–15)
Anion gap: 7 (ref 5–15)
BUN: 49 mg/dL — ABNORMAL HIGH (ref 6–23)
BUN: 46 mg/dL — ABNORMAL HIGH (ref 6–23)
BUN: 43 mg/dL — ABNORMAL HIGH (ref 6–23)
CHLORIDE: 113 mmol/L — AB (ref 96–112)
CO2: 23 mmol/L (ref 19–32)
CO2: 23 mmol/L (ref 19–32)
CO2: 25 mmol/L (ref 19–32)
CREATININE: 2.22 mg/dL — AB (ref 0.50–1.10)
Calcium: 6.7 mg/dL — ABNORMAL LOW (ref 8.4–10.5)
Calcium: 6.9 mg/dL — ABNORMAL LOW (ref 8.4–10.5)
Calcium: 7 mg/dL — ABNORMAL LOW (ref 8.4–10.5)
Chloride: 117 mmol/L — ABNORMAL HIGH (ref 96–112)
Chloride: 112 mmol/L (ref 96–112)
Creatinine, Ser: 2.13 mg/dL — ABNORMAL HIGH (ref 0.50–1.10)
Creatinine, Ser: 1.99 mg/dL — ABNORMAL HIGH (ref 0.50–1.10)
GFR calc Af Amer: 21 mL/min — ABNORMAL LOW (ref >90)
GFR calc Af Amer: 22 mL/min — ABNORMAL LOW (ref >90)
GFR calc non Af Amer: 19 mL/min — ABNORMAL LOW (ref >90)
GFR calc non Af Amer: 21 mL/min — ABNORMAL LOW (ref >90)
GFR, EST AFRICAN AMERICAN: 24 mL/min — AB (ref >90)
GFR, EST NON AFRICAN AMERICAN: 18 mL/min — AB (ref >90)
GLUCOSE: 95 mg/dL (ref 70–99)
GLUCOSE: 88 mg/dL (ref 70–99)
Glucose, Bld: 89 mg/dL (ref 70–99)
POTASSIUM: 4 mmol/L (ref 3.5–5.1)
POTASSIUM: 3.5 mmol/L (ref 3.5–5.1)
Potassium: 3.9 mmol/L (ref 3.5–5.1)
SODIUM: 146 mmol/L — AB (ref 135–145)
SODIUM: 144 mmol/L (ref 135–145)
Sodium: 144 mmol/L (ref 135–145)

## 2014-12-11 LAB — PROTIME-INR
INR: 1.44 (ref 0.00–1.49)
PROTHROMBIN TIME: 17.7 s — AB (ref 11.6–15.2)

## 2014-12-11 LAB — APTT: APTT: 29 s (ref 24–37)

## 2014-12-11 MED ORDER — LEVOTHYROXINE SODIUM 25 MCG PO TABS
25.0000 ug | ORAL_TABLET | Freq: Every day | ORAL | Status: DC
  Administered 2014-12-12 – 2014-12-16 (×5): 25 ug via ORAL
  Filled 2014-12-11 (×7): qty 1

## 2014-12-11 MED ORDER — SODIUM CHLORIDE 0.9 % IJ SOLN
10.0000 mL | INTRAMUSCULAR | Status: DC | PRN
  Administered 2014-12-12: 10 mL
  Filled 2014-12-11: qty 40

## 2014-12-11 MED ORDER — AMLODIPINE BESYLATE 5 MG PO TABS
5.0000 mg | ORAL_TABLET | Freq: Every day | ORAL | Status: DC
  Administered 2014-12-11 – 2014-12-16 (×6): 5 mg via ORAL
  Filled 2014-12-11 (×6): qty 1

## 2014-12-11 NOTE — Progress Notes (Signed)
[facility] Progress Note 2 Days Post-Op  Subjective: Patient is hemodynamically stable and hemoglobin has been stable.  No distress  Objective: Vital signs in last 24 hours: Temp:  [98.1 F (36.7 C)-99.5 F (37.5 C)] 99.2 F (37.3 C) (02/24 0802) Pulse Rate:  [33-97] 91 (02/24 0600) Resp:  [12-28] 16 (02/24 0600) BP: (104-157)/(34-119) 141/78 mmHg (02/24 0600) SpO2:  [94 %-100 %] 97 % (02/24 0600) Arterial Line BP: (128-215)/(58-208) 191/62 mmHg (02/23 1600) Weight:  [60.2 kg (132 lb 11.5 oz)] 60.2 kg (132 lb 11.5 oz) (02/24 0500) Last BM Date: 12/09/14  Intake/Output from previous day: 02/23 0701 - 02/24 0700 In: 1835 [P.O.:60; I.V.:1775] Out: 225 [Urine:225] Intake/Output this shift:    General: No acute distress  Lungs: Clear  Abd: Non-tender.  Good blwoel sounds.  Extremities: No changes  Neuro: Intact  Lab Results:  _0 (wbc:2,hgb:2,hct:2,plt:2) BMET  Recent Labs  12/10/14 2055 12/11/14 0519  NA 145 144  K 4.5 4.0  CL 116* 117*  CO2 22 23  GLUCOSE 127* 95  BUN 49* 49*  CREATININE 2.22* 2.22*  CALCIUM 7.1* 6.7*   PT/INR  Recent Labs  12/11/14 0519  LABPROT 17.7*  INR 1.44   ABG  Recent Labs  12/09/14 2109 12/10/14 0335  PHART 7.142* 7.291*  HCO3 7.1* 10.8*    Studies/Results: Ir Angiogram Visceral Selective  12/09/2014   INDICATION: Acute upper GI bleed from duodenal ulcer. Patient is currently hemodynamically unstable.  Note, patient has history of chronic renal insufficiency (preprocedural creatinine -1.2), however the patient was deemed not a surgical candidate given her advanced age. Given her multiple medical comorbidities and unstable state, the decision was made to proceed with potential life saving mesenteric arteriogram and potential percutaneous embolization.  EXAM: 1. ULTRASOUND GUIDANCE FOR ARTERIAL ACCESS 2. CELIAC ARTERIOGRAM 3. GASTRODUODENAL ARTERIOGRAM AND PERCUTANEOUS COIL EMBOLIZATION 4. PANCREATICODUODENAL  ARTERIOGRAM AND PERCUTANEOUS COIL EMBOLIZATION  COMPARISON:  NONE  MEDICATIONS: Patient continuing monitored and supported by the anesthesia service.  CONTRAST:  45 mL OMNIPAQUE IOHEXOL 300 MG/ML  SOLN  FLUOROSCOPY TIME:  11 minutes.  12 seconds (3,491 mGy)  COMPLICATIONS: None immediate  TECHNIQUE: Informed written consent was obtained from the patient's son after a discussion of the risks, benefits and alternatives to treatment. Questions regarding the procedure were encouraged and answered. A timeout was performed prior to the initiation of the procedure.  The right groin was prepped and draped in the usual sterile fashion, and a sterile drape was applied covering the operative field. Maximum barrier sterile technique with sterile gowns and gloves were used for the procedure. A timeout was performed prior to the initiation of the procedure. Local anesthesia was provided with 1% lidocaine.  The right femoral head was marked fluoroscopically. Under ultrasound guidance, the right common femoral artery was accessed with a micropuncture kit after the overlying soft tissues were anesthetized with 1% lidocaine. An ultrasound image was saved for documentation purposes. The micropuncture sheath was exchanged for a 5 [place] vascular sheath over a Bentson wire. A closure arteriogram was performed through the side of the sheath confirming access within the right common femoral artery.  Over a Bentson wire, a Mickelson catheter was advanced to the level of the thoracic aorta where it was back bled and flushed. The catheter was then utilized to select the celiac artery. A celiac arteriogram was performed.  With the use of a synchro 14 micro catheter, a regular Renegade micro catheter was advanced through the gastroduodenal artery into the extravasated duodenal ulcer. Limited  contrast injection confirmed appropriate positioning and the GDA was back coiled to near its origin.  Post embolization arteriogram demonstrated a  hypertrophied pancreaticoduodenal branch which appeared to provide collateral supply to the area of extravasation within the duodenum. As such, the micro catheter was advanced into the distal aspect of the pancreaticoduodenal artery. Contrast injection confirmed appropriate positioning and the vessel was back coiled to near its origin.  The microcatheter was retracted into the proper hepatic artery and a proper hepatic arteriogram was performed. The micro catheter was removed and a completion celiac arteriogram was performed via the [facility] catheter. Images were reviewed and the procedure was terminated.  All wires, catheters and sheaths were removed from the patient. Hemostasis was achieved at the right groin access site with deployment of an Exoseal closure device. A dressing was placed. The patient tolerated the procedure well without immediate postprocedural complication and was noted to be hemodynamically stable at the time of procedure completion.  FINDINGS: Celiac arteriogram demonstrates frank extravasation from a branch arising from the mid aspect of the GDA. All branches of the celiac artery appear diffusely spasmodic compatible with hypovolemic state.  With the use of a a micro wire, the micro catheter was advanced into the area of active extravasation and the GDA was subsequently back coiled to near its origin.  Post GDA arteriogram demonstrates potential persistent ill-defined supply to the descending duodenum via hypertrophied blanch of the pancreaticoduodenal artery. Note was also made of retrograde supply to the proximal SMA. As such, this branch was cannulated and also back coiled to near its origin.  Completion arteriograms performed via the micro catheter from the common hepatic and via the Mickelson catheter from the celiac did not demonstrate any persistent supply to the area of active extravasation within the descending segment of the duodenum.  Completion superior mesenteric arteriogram was  not performed given patient's renal insufficiency  IMPRESSION: Technically successful percutaneous coil embolization of the GDA and a hypertrophied pancreaticoduodenal artery for active extravasation.   Electronically Signed   By: Sandi Mariscal M.D.   On: 12/09/2014 17:27   Ir Angiogram Selective Each Additional Vessel  12/09/2014   INDICATION: Acute upper GI bleed from duodenal ulcer. Patient is currently hemodynamically unstable.  Note, patient has history of chronic renal insufficiency (preprocedural creatinine -1.2), however the patient was deemed not a surgical candidate given her advanced age. Given her multiple medical comorbidities and unstable state, the decision was made to proceed with potential life saving mesenteric arteriogram and potential percutaneous embolization.  EXAM: 1. ULTRASOUND GUIDANCE FOR ARTERIAL ACCESS 2. CELIAC ARTERIOGRAM 3. GASTRODUODENAL ARTERIOGRAM AND PERCUTANEOUS COIL EMBOLIZATION 4. PANCREATICODUODENAL ARTERIOGRAM AND PERCUTANEOUS COIL EMBOLIZATION  COMPARISON:  NONE  MEDICATIONS: Patient continuing monitored and supported by the anesthesia service.  CONTRAST:  45 mL OMNIPAQUE IOHEXOL 300 MG/ML  SOLN  FLUOROSCOPY TIME:  11 minutes.  12 seconds (6,720 mGy)  COMPLICATIONS: None immediate  TECHNIQUE: Informed written consent was obtained from the patient's son after a discussion of the risks, benefits and alternatives to treatment. Questions regarding the procedure were encouraged and answered. A timeout was performed prior to the initiation of the procedure.  The right groin was prepped and draped in the usual sterile fashion, and a sterile drape was applied covering the operative field. Maximum barrier sterile technique with sterile gowns and gloves were used for the procedure. A timeout was performed prior to the initiation of the procedure. Local anesthesia was provided with 1% lidocaine.  The right femoral head was marked  fluoroscopically. Under ultrasound guidance, the right  common femoral artery was accessed with a micropuncture kit after the overlying soft tissues were anesthetized with 1% lidocaine. An ultrasound image was saved for documentation purposes. The micropuncture sheath was exchanged for a 5 [place] vascular sheath over a Bentson wire. A closure arteriogram was performed through the side of the sheath confirming access within the right common femoral artery.  Over a Bentson wire, a Mickelson catheter was advanced to the level of the thoracic aorta where it was back bled and flushed. The catheter was then utilized to select the celiac artery. A celiac arteriogram was performed.  With the use of a synchro 14 micro catheter, a regular Renegade micro catheter was advanced through the gastroduodenal artery into the extravasated duodenal ulcer. Limited contrast injection confirmed appropriate positioning and the GDA was back coiled to near its origin.  Post embolization arteriogram demonstrated a hypertrophied pancreaticoduodenal branch which appeared to provide collateral supply to the area of extravasation within the duodenum. As such, the micro catheter was advanced into the distal aspect of the pancreaticoduodenal artery. Contrast injection confirmed appropriate positioning and the vessel was back coiled to near its origin.  The microcatheter was retracted into the proper hepatic artery and a proper hepatic arteriogram was performed. The micro catheter was removed and a completion celiac arteriogram was performed via the [facility] catheter. Images were reviewed and the procedure was terminated.  All wires, catheters and sheaths were removed from the patient. Hemostasis was achieved at the right groin access site with deployment of an Exoseal closure device. A dressing was placed. The patient tolerated the procedure well without immediate postprocedural complication and was noted to be hemodynamically stable at the time of procedure completion.  FINDINGS: Celiac arteriogram  demonstrates frank extravasation from a branch arising from the mid aspect of the GDA. All branches of the celiac artery appear diffusely spasmodic compatible with hypovolemic state.  With the use of a a micro wire, the micro catheter was advanced into the area of active extravasation and the GDA was subsequently back coiled to near its origin.  Post GDA arteriogram demonstrates potential persistent ill-defined supply to the descending duodenum via hypertrophied blanch of the pancreaticoduodenal artery. Note was also made of retrograde supply to the proximal SMA. As such, this branch was cannulated and also back coiled to near its origin.  Completion arteriograms performed via the micro catheter from the common hepatic and via the Mickelson catheter from the celiac did not demonstrate any persistent supply to the area of active extravasation within the descending segment of the duodenum.  Completion superior mesenteric arteriogram was not performed given patient's renal insufficiency  IMPRESSION: Technically successful percutaneous coil embolization of the GDA and a hypertrophied pancreaticoduodenal artery for active extravasation.   Electronically Signed   By: Sandi Mariscal M.D.   On: 12/09/2014 17:27   Ir Angiogram Selective Each Additional Vessel  12/09/2014   INDICATION: Acute upper GI bleed from duodenal ulcer. Patient is currently hemodynamically unstable.  Note, patient has history of chronic renal insufficiency (preprocedural creatinine -1.2), however the patient was deemed not a surgical candidate given her advanced age. Given her multiple medical comorbidities and unstable state, the decision was made to proceed with potential life saving mesenteric arteriogram and potential percutaneous embolization.  EXAM: 1. ULTRASOUND GUIDANCE FOR ARTERIAL ACCESS 2. CELIAC ARTERIOGRAM 3. GASTRODUODENAL ARTERIOGRAM AND PERCUTANEOUS COIL EMBOLIZATION 4. PANCREATICODUODENAL ARTERIOGRAM AND PERCUTANEOUS COIL EMBOLIZATION   COMPARISON:  NONE  MEDICATIONS: Patient continuing monitored and  supported by the anesthesia service.  CONTRAST:  45 mL OMNIPAQUE IOHEXOL 300 MG/ML  SOLN  FLUOROSCOPY TIME:  11 minutes.  12 seconds (9,937 mGy)  COMPLICATIONS: None immediate  TECHNIQUE: Informed written consent was obtained from the patient's son after a discussion of the risks, benefits and alternatives to treatment. Questions regarding the procedure were encouraged and answered. A timeout was performed prior to the initiation of the procedure.  The right groin was prepped and draped in the usual sterile fashion, and a sterile drape was applied covering the operative field. Maximum barrier sterile technique with sterile gowns and gloves were used for the procedure. A timeout was performed prior to the initiation of the procedure. Local anesthesia was provided with 1% lidocaine.  The right femoral head was marked fluoroscopically. Under ultrasound guidance, the right common femoral artery was accessed with a micropuncture kit after the overlying soft tissues were anesthetized with 1% lidocaine. An ultrasound image was saved for documentation purposes. The micropuncture sheath was exchanged for a 5 [place] vascular sheath over a Bentson wire. A closure arteriogram was performed through the side of the sheath confirming access within the right common femoral artery.  Over a Bentson wire, a Mickelson catheter was advanced to the level of the thoracic aorta where it was back bled and flushed. The catheter was then utilized to select the celiac artery. A celiac arteriogram was performed.  With the use of a synchro 14 micro catheter, a regular Renegade micro catheter was advanced through the gastroduodenal artery into the extravasated duodenal ulcer. Limited contrast injection confirmed appropriate positioning and the GDA was back coiled to near its origin.  Post embolization arteriogram demonstrated a hypertrophied pancreaticoduodenal branch which appeared  to provide collateral supply to the area of extravasation within the duodenum. As such, the micro catheter was advanced into the distal aspect of the pancreaticoduodenal artery. Contrast injection confirmed appropriate positioning and the vessel was back coiled to near its origin.  The microcatheter was retracted into the proper hepatic artery and a proper hepatic arteriogram was performed. The micro catheter was removed and a completion celiac arteriogram was performed via the [facility] catheter. Images were reviewed and the procedure was terminated.  All wires, catheters and sheaths were removed from the patient. Hemostasis was achieved at the right groin access site with deployment of an Exoseal closure device. A dressing was placed. The patient tolerated the procedure well without immediate postprocedural complication and was noted to be hemodynamically stable at the time of procedure completion.  FINDINGS: Celiac arteriogram demonstrates frank extravasation from a branch arising from the mid aspect of the GDA. All branches of the celiac artery appear diffusely spasmodic compatible with hypovolemic state.  With the use of a a micro wire, the micro catheter was advanced into the area of active extravasation and the GDA was subsequently back coiled to near its origin.  Post GDA arteriogram demonstrates potential persistent ill-defined supply to the descending duodenum via hypertrophied blanch of the pancreaticoduodenal artery. Note was also made of retrograde supply to the proximal SMA. As such, this branch was cannulated and also back coiled to near its origin.  Completion arteriograms performed via the micro catheter from the common hepatic and via the Mickelson catheter from the celiac did not demonstrate any persistent supply to the area of active extravasation within the descending segment of the duodenum.  Completion superior mesenteric arteriogram was not performed given patient's renal insufficiency   IMPRESSION: Technically successful percutaneous coil embolization of the GDA  and a hypertrophied pancreaticoduodenal artery for active extravasation.   Electronically Signed   By: Sandi Mariscal M.D.   On: 12/09/2014 17:27   Ir Angiogram Follow Up Study  12/09/2014   INDICATION: Acute upper GI bleed from duodenal ulcer. Patient is currently hemodynamically unstable.  Note, patient has history of chronic renal insufficiency (preprocedural creatinine -1.2), however the patient was deemed not a surgical candidate given her advanced age. Given her multiple medical comorbidities and unstable state, the decision was made to proceed with potential life saving mesenteric arteriogram and potential percutaneous embolization.  EXAM: 1. ULTRASOUND GUIDANCE FOR ARTERIAL ACCESS 2. CELIAC ARTERIOGRAM 3. GASTRODUODENAL ARTERIOGRAM AND PERCUTANEOUS COIL EMBOLIZATION 4. PANCREATICODUODENAL ARTERIOGRAM AND PERCUTANEOUS COIL EMBOLIZATION  COMPARISON:  NONE  MEDICATIONS: Patient continuing monitored and supported by the anesthesia service.  CONTRAST:  45 mL OMNIPAQUE IOHEXOL 300 MG/ML  SOLN  FLUOROSCOPY TIME:  11 minutes.  12 seconds (6,256 mGy)  COMPLICATIONS: None immediate  TECHNIQUE: Informed written consent was obtained from the patient's son after a discussion of the risks, benefits and alternatives to treatment. Questions regarding the procedure were encouraged and answered. A timeout was performed prior to the initiation of the procedure.  The right groin was prepped and draped in the usual sterile fashion, and a sterile drape was applied covering the operative field. Maximum barrier sterile technique with sterile gowns and gloves were used for the procedure. A timeout was performed prior to the initiation of the procedure. Local anesthesia was provided with 1% lidocaine.  The right femoral head was marked fluoroscopically. Under ultrasound guidance, the right common femoral artery was accessed with a micropuncture kit after the  overlying soft tissues were anesthetized with 1% lidocaine. An ultrasound image was saved for documentation purposes. The micropuncture sheath was exchanged for a 5 [place] vascular sheath over a Bentson wire. A closure arteriogram was performed through the side of the sheath confirming access within the right common femoral artery.  Over a Bentson wire, a Mickelson catheter was advanced to the level of the thoracic aorta where it was back bled and flushed. The catheter was then utilized to select the celiac artery. A celiac arteriogram was performed.  With the use of a synchro 14 micro catheter, a regular Renegade micro catheter was advanced through the gastroduodenal artery into the extravasated duodenal ulcer. Limited contrast injection confirmed appropriate positioning and the GDA was back coiled to near its origin.  Post embolization arteriogram demonstrated a hypertrophied pancreaticoduodenal branch which appeared to provide collateral supply to the area of extravasation within the duodenum. As such, the micro catheter was advanced into the distal aspect of the pancreaticoduodenal artery. Contrast injection confirmed appropriate positioning and the vessel was back coiled to near its origin.  The microcatheter was retracted into the proper hepatic artery and a proper hepatic arteriogram was performed. The micro catheter was removed and a completion celiac arteriogram was performed via the [facility] catheter. Images were reviewed and the procedure was terminated.  All wires, catheters and sheaths were removed from the patient. Hemostasis was achieved at the right groin access site with deployment of an Exoseal closure device. A dressing was placed. The patient tolerated the procedure well without immediate postprocedural complication and was noted to be hemodynamically stable at the time of procedure completion.  FINDINGS: Celiac arteriogram demonstrates frank extravasation from a branch arising from the mid  aspect of the GDA. All branches of the celiac artery appear diffusely spasmodic compatible with hypovolemic state.  With the use of  a a micro wire, the micro catheter was advanced into the area of active extravasation and the GDA was subsequently back coiled to near its origin.  Post GDA arteriogram demonstrates potential persistent ill-defined supply to the descending duodenum via hypertrophied blanch of the pancreaticoduodenal artery. Note was also made of retrograde supply to the proximal SMA. As such, this branch was cannulated and also back coiled to near its origin.  Completion arteriograms performed via the micro catheter from the common hepatic and via the Mickelson catheter from the celiac did not demonstrate any persistent supply to the area of active extravasation within the descending segment of the duodenum.  Completion superior mesenteric arteriogram was not performed given patient's renal insufficiency  IMPRESSION: Technically successful percutaneous coil embolization of the GDA and a hypertrophied pancreaticoduodenal artery for active extravasation.   Electronically Signed   By: Sandi Mariscal M.D.   On: 12/09/2014 17:27   Ir US Guide Vasc Access Right  12/09/2014   INDICATION: Acute upper GI bleed from duodenal ulcer. Patient is currently hemodynamically unstable.  Note, patient has history of chronic renal insufficiency (preprocedural creatinine -1.2), however the patient was deemed not a surgical candidate given her advanced age. Given her multiple medical comorbidities and unstable state, the decision was made to proceed with potential life saving mesenteric arteriogram and potential percutaneous embolization.  EXAM: 1. ULTRASOUND GUIDANCE FOR ARTERIAL ACCESS 2. CELIAC ARTERIOGRAM 3. GASTRODUODENAL ARTERIOGRAM AND PERCUTANEOUS COIL EMBOLIZATION 4. PANCREATICODUODENAL ARTERIOGRAM AND PERCUTANEOUS COIL EMBOLIZATION  COMPARISON:  NONE  MEDICATIONS: Patient continuing monitored and supported by the  anesthesia service.  CONTRAST:  45 mL OMNIPAQUE IOHEXOL 300 MG/ML  SOLN  FLUOROSCOPY TIME:  11 minutes.  12 seconds (3,149 mGy)  COMPLICATIONS: None immediate  TECHNIQUE: Informed written consent was obtained from the patient's son after a discussion of the risks, benefits and alternatives to treatment. Questions regarding the procedure were encouraged and answered. A timeout was performed prior to the initiation of the procedure.  The right groin was prepped and draped in the usual sterile fashion, and a sterile drape was applied covering the operative field. Maximum barrier sterile technique with sterile gowns and gloves were used for the procedure. A timeout was performed prior to the initiation of the procedure. Local anesthesia was provided with 1% lidocaine.  The right femoral head was marked fluoroscopically. Under ultrasound guidance, the right common femoral artery was accessed with a micropuncture kit after the overlying soft tissues were anesthetized with 1% lidocaine. An ultrasound image was saved for documentation purposes. The micropuncture sheath was exchanged for a 5 [place] vascular sheath over a Bentson wire. A closure arteriogram was performed through the side of the sheath confirming access within the right common femoral artery.  Over a Bentson wire, a Mickelson catheter was advanced to the level of the thoracic aorta where it was back bled and flushed. The catheter was then utilized to select the celiac artery. A celiac arteriogram was performed.  With the use of a synchro 14 micro catheter, a regular Renegade micro catheter was advanced through the gastroduodenal artery into the extravasated duodenal ulcer. Limited contrast injection confirmed appropriate positioning and the GDA was back coiled to near its origin.  Post embolization arteriogram demonstrated a hypertrophied pancreaticoduodenal branch which appeared to provide collateral supply to the area of extravasation within the duodenum. As  such, the micro catheter was advanced into the distal aspect of the pancreaticoduodenal artery. Contrast injection confirmed appropriate positioning and the vessel was back coiled to near  its origin.  The microcatheter was retracted into the proper hepatic artery and a proper hepatic arteriogram was performed. The micro catheter was removed and a completion celiac arteriogram was performed via the [facility] catheter. Images were reviewed and the procedure was terminated.  All wires, catheters and sheaths were removed from the patient. Hemostasis was achieved at the right groin access site with deployment of an Exoseal closure device. A dressing was placed. The patient tolerated the procedure well without immediate postprocedural complication and was noted to be hemodynamically stable at the time of procedure completion.  FINDINGS: Celiac arteriogram demonstrates frank extravasation from a branch arising from the mid aspect of the GDA. All branches of the celiac artery appear diffusely spasmodic compatible with hypovolemic state.  With the use of a a micro wire, the micro catheter was advanced into the area of active extravasation and the GDA was subsequently back coiled to near its origin.  Post GDA arteriogram demonstrates potential persistent ill-defined supply to the descending duodenum via hypertrophied blanch of the pancreaticoduodenal artery. Note was also made of retrograde supply to the proximal SMA. As such, this branch was cannulated and also back coiled to near its origin.  Completion arteriograms performed via the micro catheter from the common hepatic and via the Mickelson catheter from the celiac did not demonstrate any persistent supply to the area of active extravasation within the descending segment of the duodenum.  Completion superior mesenteric arteriogram was not performed given patient's renal insufficiency  IMPRESSION: Technically successful percutaneous coil embolization of the GDA and a  hypertrophied pancreaticoduodenal artery for active extravasation.   Electronically Signed   By: Sandi Mariscal M.D.   On: 12/09/2014 17:27   [place] [facility] [employer]  12/09/2014   INDICATION: Acute upper GI bleed from duodenal ulcer. Patient is currently hemodynamically unstable.  Note, patient has history of chronic renal insufficiency (preprocedural creatinine -1.2), however the patient was deemed not a surgical candidate given her advanced age. Given her multiple medical comorbidities and unstable state, the decision was made to proceed with potential life saving mesenteric arteriogram and potential percutaneous embolization.  EXAM: 1. ULTRASOUND GUIDANCE FOR ARTERIAL ACCESS 2. CELIAC ARTERIOGRAM 3. GASTRODUODENAL ARTERIOGRAM AND PERCUTANEOUS COIL EMBOLIZATION 4. PANCREATICODUODENAL ARTERIOGRAM AND PERCUTANEOUS COIL EMBOLIZATION  COMPARISON:  NONE  MEDICATIONS: Patient continuing monitored and supported by the anesthesia service.  CONTRAST:  45 mL OMNIPAQUE IOHEXOL 300 MG/ML  SOLN  FLUOROSCOPY TIME:  11 minutes.  12 seconds (3,151 mGy)  COMPLICATIONS: None immediate  TECHNIQUE: Informed written consent was obtained from the patient's son after a discussion of the risks, benefits and alternatives to treatment. Questions regarding the procedure were encouraged and answered. A timeout was performed prior to the initiation of the procedure.  The right groin was prepped and draped in the usual sterile fashion, and a sterile drape was applied covering the operative field. Maximum barrier sterile technique with sterile gowns and gloves were used for the procedure. A timeout was performed prior to the initiation of the procedure. Local anesthesia was provided with 1% lidocaine.  The right femoral head was marked fluoroscopically. Under ultrasound guidance, the right common femoral artery was accessed with a micropuncture kit after the overlying soft tissues were anesthetized with 1%  lidocaine. An ultrasound image was saved for documentation purposes. The micropuncture sheath was exchanged for a 5 [place] vascular sheath over a Bentson wire. A closure arteriogram was performed through the side of the sheath confirming  access within the right common femoral artery.  Over a Bentson wire, a Mickelson catheter was advanced to the level of the thoracic aorta where it was back bled and flushed. The catheter was then utilized to select the celiac artery. A celiac arteriogram was performed.  With the use of a synchro 14 micro catheter, a regular Renegade micro catheter was advanced through the gastroduodenal artery into the extravasated duodenal ulcer. Limited contrast injection confirmed appropriate positioning and the GDA was back coiled to near its origin.  Post embolization arteriogram demonstrated a hypertrophied pancreaticoduodenal branch which appeared to provide collateral supply to the area of extravasation within the duodenum. As such, the micro catheter was advanced into the distal aspect of the pancreaticoduodenal artery. Contrast injection confirmed appropriate positioning and the vessel was back coiled to near its origin.  The microcatheter was retracted into the proper hepatic artery and a proper hepatic arteriogram was performed. The micro catheter was removed and a completion celiac arteriogram was performed via the [facility] catheter. Images were reviewed and the procedure was terminated.  All wires, catheters and sheaths were removed from the patient. Hemostasis was achieved at the right groin access site with deployment of an Exoseal closure device. A dressing was placed. The patient tolerated the procedure well without immediate postprocedural complication and was noted to be hemodynamically stable at the time of procedure completion.  FINDINGS: Celiac arteriogram demonstrates frank extravasation from a branch arising from the mid aspect of the GDA. All branches of the celiac artery  appear diffusely spasmodic compatible with hypovolemic state.  With the use of a a micro wire, the micro catheter was advanced into the area of active extravasation and the GDA was subsequently back coiled to near its origin.  Post GDA arteriogram demonstrates potential persistent ill-defined supply to the descending duodenum via hypertrophied blanch of the pancreaticoduodenal artery. Note was also made of retrograde supply to the proximal SMA. As such, this branch was cannulated and also back coiled to near its origin.  Completion arteriograms performed via the micro catheter from the common hepatic and via the Mickelson catheter from the celiac did not demonstrate any persistent supply to the area of active extravasation within the descending segment of the duodenum.  Completion superior mesenteric arteriogram was not performed given patient's renal insufficiency  IMPRESSION: Technically successful percutaneous coil embolization of the GDA and a hypertrophied pancreaticoduodenal artery for active extravasation.   Electronically Signed   By: Sandi Mariscal M.D.   On: 12/09/2014 17:27    Anti-infectives: Anti-infectives    Start     Dose/Rate Route Frequency Ordered Stop   12/10/14 1315  cefUROXime (CEFTIN) tablet 250 mg     250 mg Oral Daily 12/10/14 1300 12/14/14 1159   12/08/14 1700  cefUROXime (CEFTIN) tablet 500 mg  Status:  Discontinued     500 mg Oral 2 times daily with meals 12/08/14 1120 12/09/14 1901   12/05/14 1200  cefTRIAXone (ROCEPHIN) 1 g in dextrose 5 % 50 mL IVPB  Status:  Discontinued     1 g 100 mL/hr over 30 Minutes Intravenous Every 24 hours 12/05/14 1148 12/08/14 1120      Assessment/Plan: s/p Procedure(s): RADIOLOGY WITH ANESTHESIA Advance diet  We sill signoff from surgery standpoint.  LOS: 6 days   Kathryne Eriksson. Dahlia Bailiff, MD, FACS [phone] [facility] Surgery 12/11/2014

## 2014-12-11 NOTE — Progress Notes (Signed)
PULMONARY / CRITICAL CARE MEDICINE   Name: Sheila Murphy MRN: [id number] DOB: 1921-12-19    ADMISSION DATE:  12/05/2014 CONSULTATION DATE:  12/09/14   REFERRING MD :  Dr Sharon SellerMcClung  CHIEF COMPLAINT:  Upper GIB, hemorrhagic shock  INITIAL PRESENTATION:  81 yo woman, admitted with UGIB and secondary AKI 2/18, found to have a large DU on EGD 2/19. She was treated with PPI and resuscitated. Unfortunately she continued to lose blood, worsening 2/21 pm. Repeat EGD 2/22 showed stomach full of blood - no intervention was possible. She went for IR embolization of GDA on 2/22. She transfers now to ICU for stabilization post-procedure. She received 8u PRBC, 2 prior to the procedure and 6 during embolization. She had periods of hypotension reported while in IR but is hemodynamically stable in the ICU currently.   STUDIES:  EGD 2/19 >> large duodenal ulcer EGD 2/22 >> stomach full of blood, visualization and intervention impossible Arteriogram 2/22 >> culprit vessel embolized via the GDA.   SUBJECTIVE: no bleeding  VITAL SIGNS: Temp:  [98.1 F (36.7 C)-99.5 F (37.5 C)] 99.2 F (37.3 C) (02/24 0802) Pulse Rate:  [33-96] 91 (02/24 1000) Resp:  [12-26] 24 (02/24 1000) BP: (104-160)/(34-119) 160/57 mmHg (02/24 1000) SpO2:  [94 %-100 %] 95 % (02/24 1000) Arterial Line BP: (128-215)/(58-208) 191/62 mmHg (02/23 1600) Weight:  [60.2 kg (132 lb 11.5 oz)] 60.2 kg (132 lb 11.5 oz) (02/24 0500) HEMODYNAMICS: CVP:  [1 mmHg-2 mmHg] 2 mmHg VENTILATOR SETTINGS:   INTAKE / OUTPUT:  Intake/Output Summary (Last 24 hours) at 12/11/14 1113 Last data filed at 12/11/14 1000  Gross per 24 hour  Intake   1795 ml  Output    400 ml  Net   1395 ml    PHYSICAL EXAMINATION: General:  Elderly woman, laying flat, comfortable, walked earlier Neuro:  Awake, alert, interacting, answers questions, moves extremities, walked well with assistance HEENT:  OP dry Cardiovascular:  Regular, tachy s1 s2   Lungs:  s1 s2  rrr Abdomen:  Old surgical scars, large midline ventral hernia, non-tender to palp Musculoskeletal:  No edema Skin:  No rash  LABS:  CBC  Recent Labs Lab 12/10/14 1100 12/10/14 1700 12/10/14 2344  WBC 22.3* 20.8* 19.3*  HGB 14.8 13.2 11.6*  HCT 41.1 37.1 33.5*  PLT 39* 51* 60*   Coag's  Recent Labs Lab 12/06/14 0415 12/11/14 0519  APTT 27 29  INR 1.32 1.44   BMET  Recent Labs Lab 12/10/14 1255 12/10/14 2055 12/11/14 0519  NA 142 145 144  K 4.7 4.5 4.0  CL 120* 116* 117*  CO2 16* 22 23  BUN 46* 49* 49*  CREATININE 1.98* 2.22* 2.22*  GLUCOSE 158* 127* 95   Electrolytes  Recent Labs Lab 12/06/14 0415  12/10/14 1255 12/10/14 2055 12/11/14 0519  CALCIUM 8.7  < > 6.9* 7.1* 6.7*  MG 1.9  --   --   --   --   < > = values in this interval not displayed. Sepsis Markers No results for input(s): LATICACIDVEN, PROCALCITON, O2SATVEN in the last 168 hours. ABG  Recent Labs Lab 12/09/14 1552 12/09/14 2109 12/10/14 0335  PHART 7.282* 7.142* 7.291*  PCO2ART 20.0* 20.2* 22.0*  PO2ART 251.0* 142.0* 79.0*   Liver Enzymes  Recent Labs Lab 12/05/14 0933 12/06/14 0415 12/08/14 0330  AST 30 27 25   ALT 12 13 14   ALKPHOS 43 40 35*  BILITOT 0.9 1.0 0.5  ALBUMIN 2.7* 2.6* 2.6*   Cardiac Enzymes No results  for input(s): TROPONINI, PROBNP in the last 168 hours. Glucose  Recent Labs Lab 12/09/14 1709  GLUCAP 127*    Imaging No results found.   ASSESSMENT / PLAN:  PULMONARY A: No acute issues, at risk for TRALI given blood products, at risk pulm edema (cardiac dz) P:   O2 PRN to keep SpO2 > 92% No need film, no resp symptoms or signs  CARDIOVASCULAR A:  Hemorrhagic shock (resolved) Hx HTN Hx AV sclerosis Hx Hyperlipidemia P:  resume home amlodipine holding home ASA tele  RENAL A:   AKI likely ATN secondary to shock,  large NONAG acidosis from stools (resolved) P:   bmet to am  Foley to remain Dc bicarb drip  GASTROINTESTINAL A:    Acute Upper GIB due to DU, s/p GDA embolization 2/22 Large ventral hernia P:   PPI infusion to remain avoid NSAIDS, etc full liquids  HEMATOLOGIC A:   Acute blood loss anemia, thrombocytopenia (unclear) P:  will follow serial CBC to daily transfusion goal Hgb > 8.0 repeat coags for consumption await repeat plat count  INFECTIOUS A:  E coli UTI (not fully treated?), leukocytosis (demarg, urine) P:   UC 2/18 >> E coli (R to amp, otherwise sensitive) C diff negative 2/18 Ceftriaxone 2/19 >> 2/21  Repeat UA Ensure no foley Restart cefuroxime goal 7 days in total   ENDOCRINE A:   Hypothyroidism     P:   Synthroid to PO  NEUROLOGIC A:   Intermittent encephalopathy, ICU delirium P:   RASS goal: 0 avoid benzo Ambulating will reduce delerium   FAMILY  - Updates: no family available at this time 2/22  - Inter-disciplinary family meet or Palliative Care meeting due by:  2/29  Mcarthur Rossetti. Tyson Alias, MD, FACP Pgr: [phone] [place] [facility]  STAFF NOTE: I, Rory Percy, MD FACP have personally reviewed patient's available data, including medical history, events of note, physical examination and test results as part of my evaluation. I have discussed with resident/NP and other care providers such as pharmacist, RN and RRT. In addition, I personally evaluated patient and elicited key findings AV:[id number] to treat UTI, stop date in place, change bm,et to dailt, cbc to am, ambulation, no distress, on low O2, may need heme to see  To triad, floor  Mcarthur Rossetti. Tyson Alias, MD, FACP Pgr: [phone]  Pulmonary & Critical Care 12/11/2014 11:44 AM

## 2014-12-11 NOTE — Progress Notes (Signed)
Subjective: No abdominal pain. No hematemesis. One bloody stool yesterday, looked like old blood per nursing. Has sore left elbow, from prior brachial artery line placement.  Objective: Vital signs in last 24 hours: Temp:  [98.1 F (36.7 C)-99.5 F (37.5 C)] 99.2 F (37.3 C) (02/24 0802) Pulse Rate:  [33-97] 80 (02/24 0800) Resp:  [12-28] 21 (02/24 0800) BP: (104-158)/(34-119) 158/51 mmHg (02/24 0800) SpO2:  [94 %-100 %] 98 % (02/24 0800) Arterial Line BP: (128-215)/(58-208) 191/62 mmHg (02/23 1600) Weight:  [60.2 kg (132 lb 11.5 oz)] 60.2 kg (132 lb 11.5 oz) (02/24 0500) Weight change: 2.6 kg (5 lb 11.7 oz) Last BM Date: 12/09/14  PE: GEN:  NAD ABD:  Soft  Lab Results: CBC    Component Value Date/Time   WBC 19.3* 12/10/2014 2344   RBC 4.01 12/10/2014 2344   RBC 3.47* 12/05/2014 1102   HGB 11.6* 12/10/2014 2344   HCT 33.5* 12/10/2014 2344   PLT 60* 12/10/2014 2344   MCV 83.5 12/10/2014 2344   MCH 28.9 12/10/2014 2344   MCHC 34.6 12/10/2014 2344   RDW 15.8* 12/10/2014 2344   LYMPHSABS 0.5* 12/05/2014 0933   MONOABS 0.5 12/05/2014 0933   EOSABS 0.0 12/05/2014 0933   BASOSABS 0.0 12/05/2014 0933   CMP     Component Value Date/Time   NA 144 12/11/2014 0519   K 4.0 12/11/2014 0519   CL 117* 12/11/2014 0519   CO2 23 12/11/2014 0519   GLUCOSE 95 12/11/2014 0519   BUN 49* 12/11/2014 0519   CREATININE 2.22* 12/11/2014 0519   CALCIUM 6.7* 12/11/2014 0519   PROT 4.5* 12/08/2014 0330   ALBUMIN 2.6* 12/08/2014 0330   AST 25 12/08/2014 0330   ALT 14 12/08/2014 0330   ALKPHOS 35* 12/08/2014 0330   BILITOT 0.5 12/08/2014 0330   GFRNONAA 18* 12/11/2014 0519   GFRAA 21* 12/11/2014 0519   Assessment:  1. Acute blood loss anemia. Slow drift since yesterday, but suspect yesterday's values were lab errors. 2. Duodenal ulcer with active bleeding, unable to treat endoscopically, s/p IR-mediated coil embolization of gastroduodenal artery and hypertrophied  pancreaticoduodenal artery. 3. Upper GI bleeding, from #2 above. Bleeding has near completely stopped since embolization. Likely that her bowel movements post procedure are more reflective of passage of old blood rather than ongoing bleeding.  Plan:  1.  Pantoprazole drip for 24 more hours, then transition to IV/PO formulation. 2.  Full liquid diet. 3.  OK to transition out of ICU from GI perspective. 4.  Will follow.   Freddy JakschOUTLAW,Tsutomu Barfoot M 12/11/2014, 9:17 AM

## 2014-12-12 ENCOUNTER — Encounter (HOSPITAL_COMMUNITY): Payer: Self-pay | Admitting: Gastroenterology

## 2014-12-12 DIAGNOSIS — R579 Shock, unspecified: Secondary | ICD-10-CM

## 2014-12-12 LAB — BASIC METABOLIC PANEL
Anion gap: 8 (ref 5–15)
BUN: 40 mg/dL — ABNORMAL HIGH (ref 6–23)
CALCIUM: 7 mg/dL — AB (ref 8.4–10.5)
CHLORIDE: 112 mmol/L (ref 96–112)
CO2: 25 mmol/L (ref 19–32)
CREATININE: 1.88 mg/dL — AB (ref 0.50–1.10)
GFR calc Af Amer: 26 mL/min — ABNORMAL LOW (ref >90)
GFR calc non Af Amer: 22 mL/min — ABNORMAL LOW (ref >90)
Glucose, Bld: 83 mg/dL (ref 70–99)
POTASSIUM: 3.3 mmol/L — AB (ref 3.5–5.1)
Sodium: 145 mmol/L (ref 135–145)

## 2014-12-12 MED ORDER — ENSURE COMPLETE PO LIQD
237.0000 mL | Freq: Two times a day (BID) | ORAL | Status: DC
  Administered 2014-12-12 – 2014-12-16 (×7): 237 mL via ORAL

## 2014-12-12 MED ORDER — POTASSIUM CHLORIDE CRYS ER 20 MEQ PO TBCR
40.0000 meq | EXTENDED_RELEASE_TABLET | Freq: Once | ORAL | Status: AC
  Administered 2014-12-12: 40 meq via ORAL
  Filled 2014-12-12: qty 2

## 2014-12-12 MED ORDER — PANTOPRAZOLE SODIUM 40 MG PO TBEC
40.0000 mg | DELAYED_RELEASE_TABLET | Freq: Two times a day (BID) | ORAL | Status: DC
  Administered 2014-12-12 – 2014-12-16 (×8): 40 mg via ORAL
  Filled 2014-12-12 (×6): qty 1

## 2014-12-12 NOTE — Progress Notes (Signed)
NUTRITION FOLLOW UP   Pt meets criteria for severe MALNUTRITION in the context of acute illness as evidenced by <50% estimated energy intake x 5 days, mild to moderate fat and muscle wasting.  Intervention:   Ensure Complete po BID, each supplement provides 350 kcal and 13 grams of protein  Nutrition Dx:   Inadequate oral intake related to altered GI function as evidenced by NPO; resolved  New nutrition dx: Increased nutrient needs related to wound healing as evidenced by estimated needs.   Goal:   Pt will meet >90% of estimated nutritional needs; progressing  Monitor:   Diet advancement, PO intake, labs, weight changes, I/O's  Assessment:   Sheila Murphy is a 81 y.o. female, with past medical history of hypertension, hypothyroidism, dyslipidemia, known aortic valve sclerosis, asymptomatic right carotid artery disease, and osteoarthritis on regular NSAIDs. She was sent to the emergency room from the nursing home after having 2 bowel movements with a combination of blood and melena.   S/p EGD on 12/06/14 which revealed gastric and duodenal ulcers. Repeat EGD on 12/09/14 revealed large bleeding duodenal ulcer, s/p embolization.  Spoke with pt at bedside, who reports she is feeling better and has a fair appetite. She reports she ate her ice cream and drank orange juice this morning, however, did not eat the grits. Documented meal completion 50%. She reports it is easier for her to consume liquids at this time. She is amenable to strawberry Ensure. Discussed importance of good nutrition in order to promote healing. Also discussed importance of good protein intake to facilitate wound healing. Pt very grateful for visit.  Labs reviewed. K: 3.3, BUN/Creat: 40/1.88, Calcium: 7.0, CBGS: 127.   Height: Ht Readings from Last 1 Encounters:  12/05/14 5' (1.524 m)    Weight Status:   Wt Readings from Last 1 Encounters:  12/12/14 132 lb (59.875 kg)   12/06/14 110 lb 7.2 oz (50.1 kg)         Re-estimated needs:  Kcal: 1500-1700 Protein: 65-75 grams Fluid: 1.5-14.7 L  Skin: open knee laceration, stage II pressure ulcer to sacrum  Diet Order: Diet full liquid   Intake/Output Summary (Last 24 hours) at 12/12/14 0958 Last data filed at 12/12/14 0525  Gross per 24 hour  Intake    885 ml  Output    150 ml  Net    735 ml    Last BM: 12/11/14   Labs:   Recent Labs Lab 12/06/14 0415  12/11/14 1245 12/11/14 2012 12/12/14 0437  NA 141  < > 146* 144 145  K 2.5*  < > 3.9 3.5 3.3*  CL 108  < > 113* 112 112  CO2 25  < > 23 25 25   BUN 56*  < > 46* 43* 40*  CREATININE 1.60*  < > 2.13* 1.99* 1.88*  CALCIUM 8.7  < > 6.9* 7.0* 7.0*  MG 1.9  --   --   --   --   GLUCOSE 85  < > 88 89 83  < > = values in this interval not displayed.  CBG (last 3)   Recent Labs  12/09/14 1709  GLUCAP 127*    Scheduled Meds: . amLODipine  5 mg Oral Daily  . antiseptic oral rinse  7 mL Mouth Rinse q12n4p  . cefUROXime  250 mg Oral Q1200  . chlorhexidine  15 mL Mouth Rinse BID  . levothyroxine  25 mcg Oral QAC breakfast  . sodium chloride  10 mL Intravenous Q12H  .  sodium chloride  3 mL Intravenous Q12H    Continuous Infusions: . sodium chloride 10 mL/hr at 12/09/14 2345  . pantoprozole (PROTONIX) infusion 8 mg/hr (12/11/14 2355)    Tammara Massing A. Mayford Knife, RD, LDN, CDE Pager: [phone] After hours Pager: [phone]

## 2014-12-12 NOTE — Progress Notes (Signed)
Subjective: No bleeding. No abdominal pain.  Objective: Vital signs in last 24 hours: Temp:  [98 F (36.7 C)-100.3 F (37.9 C)] 98.4 F (36.9 C) (02/25 0503) Pulse Rate:  [66-81] 77 (02/25 0503) Resp:  [13-22] 18 (02/25 0503) BP: (116-160)/(33-91) 149/48 mmHg (02/25 0503) SpO2:  [95 %-100 %] 97 % (02/25 0503) Weight:  [59.875 kg (132 lb)] 59.875 kg (132 lb) (02/25 0500) Weight change: -0.325 kg (-11.5 oz) Last BM Date: 12/11/14  PE: GEN:  NAD, much younger-appearing than stated age ABD:  Soft, non-tender  Lab Results: CBC    Component Value Date/Time   WBC 19.4* 12/11/2014 1130   RBC 3.88 12/11/2014 1130   RBC 3.47* 12/05/2014 1102   HGB 11.4* 12/11/2014 1130   HCT 33.0* 12/11/2014 1130   PLT 74* 12/11/2014 1130   MCV 85.1 12/11/2014 1130   MCH 29.4 12/11/2014 1130   MCHC 34.5 12/11/2014 1130   RDW 16.1* 12/11/2014 1130   LYMPHSABS 0.5* 12/05/2014 0933   MONOABS 0.5 12/05/2014 0933   EOSABS 0.0 12/05/2014 0933   BASOSABS 0.0 12/05/2014 0933   CMP     Component Value Date/Time   NA 145 12/12/2014 0437   K 3.3* 12/12/2014 0437   CL 112 12/12/2014 0437   CO2 25 12/12/2014 0437   GLUCOSE 83 12/12/2014 0437   BUN 40* 12/12/2014 0437   CREATININE 1.88* 12/12/2014 0437   CALCIUM 7.0* 12/12/2014 0437   PROT 4.5* 12/08/2014 0330   ALBUMIN 2.6* 12/08/2014 0330   AST 25 12/08/2014 0330   ALT 14 12/08/2014 0330   ALKPHOS 35* 12/08/2014 0330   BILITOT 0.5 12/08/2014 0330   GFRNONAA 22* 12/12/2014 0437   GFRAA 26* 12/12/2014 0437   Assessment:  1. Acute blood loss anemia.  2. Duodenal ulcer with active bleeding, unable to treat endoscopically, s/p IR-mediated coil embolization of gastroduodenal artery and hypertrophied pancreaticoduodenal artery. 3. Upper GI bleeding, from #2 above. Bleeding has stopped.  Plan:  1.  Switch pantoprazole to 40 mg po bid. 2.  No NSAIDs indefinitely. 3.  Advance diet slowly. 4.  Mobilize patient, OOBTC as tolerated. 5.   From GI perspective, patient likely able to be discharged in a day or two. 6.  Will follow.   Freddy JakschUTLAW,Katalin Colledge M 12/12/2014, 10:37 AM

## 2014-12-12 NOTE — Progress Notes (Addendum)
Patient ID: Sheila Murphy, female   DOB: 1922/08/05, 81 y.o.   MRN: [id number] [facility] PROGRESS NOTE  Cadyn Rodger GEX:[id number] DOB: 1922-07-17 DOA: 12/05/2014 PCP: Alva Garnet., MD  Brief narrative:     81 year old female with past medical history of hypertension osteoporosis, dyslipidemia, hypothyroidism who presented to [facility] with initial complaints of rectal bleed, melanotic stool and hematemesis. Patient was admitted for upper GI bleed and subsequent evaluation revealed large duodenal ulcer on endoscopy done 12/06/2014. She was treated with PPI therapy. Hospital course was further more complicated due to ongoing blood loss and EGD was repeated 12/09/2014 which showed stomach full of blood but intervention was not possible. She underwent embolization of GDA by interventional radiology on 12/09/2014 area and she was transferred to ICU for stabilization post procedure. Patient has received 8 units of PRBC transfusion, 2 prior to the procedure and 6 during the embolization. Additional issue on admission was acute kidney injury which seems to have improved since the admission. Patient was on critical care medicine service and transferred to [facility] on 12/12/2014.    Assessment/Plan:    Principal Problem: Acute upper GI bleed / acute blood loss anemia secondary to active bleeding from duodenal ulcer / acute GI hemorrhagic shock - Patient presented with hematemesis on the admission. Patient was seen by GI in consultation. She underwent endoscopy on 12/06/2014 with findings of large duodenal ulcer. Patient was started on PPI therapy. Subsequently, patient found to have ongoing blood loss and EGD was repeated 12/09/2014 at which time it showed stomach full of blood. Patient underwent embolization of a duodenal artery on 12/09/2014. Patient has received total of 8 units of PRBC transfusion, 2 prior to the procedure and 6 during the embolization. - Per GI recommendations patient will continue  Protonix drip until 12/12/2014. - Patient is hemodynamically stable. - Check CBC tomorrow morning.  Active Problems:   Essential hypertension - Blood pressure at goal. - Continue Norvasc 5 mg daily    Hypothyroidism - Continue Synthroid 25 g daily    Urinary tract infection, E.Coli UTI / Leukocytosis  - Urine culture grew Escherichia coli 12/05/2014 - Patient is being treated with cefuroxime    Acute kidney injury / acute tubular necrosis - Acute kidney injury likely secondary to ATN from hemorrhagic shock - Creatinine seems to be improving since the admission with IV fluids.    Hypokalemia - Secondary to GI losses. Potassium supplemented.  - Follow-up BMP in the morning.    Protein-calorie malnutrition, severe - Likely secondary to acute illness, hemorrhagic shock - Tolerates by mouth intake. Diet as tolerated.   DVT Prophylaxis  - SCDs bilaterally due to risk of bleeding   Code Status: Full.  Family Communication:  plan of care discussed with the patient, family not at the bedside. Disposition Plan: Patient is on Protonix drip. She is not yet stable for discharge. She has been seen by physical therapy and recommendation is for home health PT.  IV access:  Peripheral IV  Procedures and diagnostic studies:    EGD 2/19 >> large duodenal ulcer  EGD 2/22 >> stomach full of blood, visualization and intervention impossible  Ir Angiogram Visceral Selective 12/09/2014 Technically successful percutaneous coil embolization of the GDA and a hypertrophied pancreaticoduodenal artery for active extravasation.     Ir Angiogram Selective Each Additional Vessel 12/09/2014 Technically successful percutaneous coil embolization of the GDA and a hypertrophied pancreaticoduodenal artery for active extravasation.     Ir Angiogram Selective Each Additional Vessel 12/09/2014   Technically  successful percutaneous coil embolization of the GDA and a hypertrophied pancreaticoduodenal artery for  active extravasation.    Ir Angiogram Follow Up Study 12/09/2014 Technically successful percutaneous coil embolization of the GDA and a hypertrophied pancreaticoduodenal artery for active extravasation.    Ir US Guide Vasc Access Right 12/09/2014  Technically successful percutaneous coil embolization of the GDA and a hypertrophied pancreaticoduodenal artery for active extravasation.    Dg Chest Port 1 View 12/11/2014 Right central venous line with tip in distal SVC.  Bilateral small effusions.     Ir Rebekah [place] Hemorr Lymph [employer] mapping 12/09/2014  Technically successful percutaneous coil embolization of the GDA and a hypertrophied pancreaticoduodenal artery for active extravasation.    Medical Consultants:  Gastroenterology Interventional radiology Surgery   Other Consultants:  Physical therapy Occupational therapy  IAnti-Infectives:   Cefuroxime    Manson Passey, MD  [facility] Pager [phone]  If 7PM-7AM, please contact night-coverage [email or web address] Password [facility] 12/12/2014, 9:27 AM   LOS: 7 days    HPI/Subjective: No acute overnight events.  Objective: Filed Vitals:   12/11/14 2000 12/11/14 2047 12/12/14 0500 12/12/14 0503  BP: 140/59 133/64  149/48  Pulse: 75 74  77  Temp:  98.8 F (37.1 C)  98.4 F (36.9 C)  TempSrc:  Oral  Oral  Resp: Height:      Weight:   59.875 kg (132 lb)   SpO2: 100% 96%  97%    Intake/Output Summary (Last 24 hours) at 12/12/14 8295 Last data filed at 12/12/14 0525  Gross per 24 hour  Intake    885 ml  Output    150 ml  Net    735 ml    Exam:   General:  Pt is alert, follows commands appropriately, not in acute distress  Cardiovascular: Regular rate and rhythm, S1/S2 (+)  Respiratory: Clear to auscultation bilaterally, no wheezing, no crackles, no rhonchi  Abdomen: Soft, non tender, non distended, bowel sounds present  Extremities: No edema, pulses DP and PT palpable  bilaterally  Neuro: Grossly nonfocal  Data Reviewed: Basic Metabolic Panel:  Recent Labs Lab 12/06/14 0415  12/10/14 2055 12/11/14 0519 12/11/14 1245 12/11/14 2012 12/12/14 0437  NA 141  < > 145 144 146* 144 145  K 2.5*  < > 4.5 4.0 3.9 3.5 3.3*  CL 108  < > 116* 117* 113* 112 112  CO2 25  < > GLUCOSE 85  < > 127* 95 88 89 83  BUN 56*  < > 49* 49* 46* 43* 40*  CREATININE 1.60*  < > 2.22* 2.22* 2.13* 1.99* 1.88*  CALCIUM 8.7  < > 7.1* 6.7* 6.9* 7.0* 7.0*  MG 1.9  --   --   --   --   --   --   < > = values in this interval not displayed. Liver Function Tests:  Recent Labs Lab 12/05/14 0933 12/06/14 0415 12/08/14 0330  AST ALT ALKPHOS 43 40 35*  BILITOT 0.9 1.0 0.5  PROT 5.3* 4.7* 4.5*  ALBUMIN 2.7* 2.6* 2.6*   No results for input(s): LIPASE, AMYLASE in the last 168 hours. No results for input(s): AMMONIA in the last 168 hours. CBC:  Recent Labs Lab 12/05/14 0933  12/10/14 0320 12/10/14 1100 12/10/14 1700 12/10/14 2344 12/11/14 1130  WBC 14.1*  < > 24.9* 22.3* 20.8* 19.3* 19.4*  NEUTROABS 13.1*  --   --   --   --   --   --   HGB 6.7*  < > 16.6* 14.8 13.2 11.6* 11.4*  HCT 19.7*  < > 46.2* 41.1 37.1 33.5* 33.0*  MCV 88.7  < > 83.2 80.7 81.4 83.5 85.1  PLT 242  < > 19* 39* 51* 60* 74*  < > = values in this interval not displayed. Cardiac Enzymes: No results for input(s): CKTOTAL, CKMB, CKMBINDEX, TROPONINI in the last 168 hours. BNP: Invalid input(s): POCBNP CBG:  Recent Labs Lab 12/09/14 1709  GLUCAP 127*    Culture, Urine     Status: None   Collection Time: 12/05/14  9:50 AM  Result Value Ref Range Status   Specimen Description URINE, RANDOM  Final   Special Requests NONE  Final   Colony Count   Final   Culture   Final    ESCHERICHIA COLI    Report Status 12/07/2014 FINAL  Final   Organism ID, Bacteria ESCHERICHIA COLI  Final      Susceptibility   Escherichia coli - MIC*    AMPICILLIN >=32 RESISTANT  Resistant     CEFAZOLIN <=4 SENSITIVE Sensitive     CEFTRIAXONE <=1 SENSITIVE Sensitive     CIPROFLOXACIN <=0.25 SENSITIVE Sensitive     GENTAMICIN 2 SENSITIVE Sensitive     LEVOFLOXACIN <=0.12 SENSITIVE Sensitive     NITROFURANTOIN <=16 SENSITIVE Sensitive     TOBRAMYCIN 2 SENSITIVE Sensitive     TRIMETH/SULFA <=20 SENSITIVE Sensitive     PIP/TAZO <=4 SENSITIVE Sensitive     * ESCHERICHIA COLI  MRSA PCR Screening     Status: None   Collection Time: 12/05/14  8:15 PM  Result Value Ref Range Status   MRSA by PCR NEGATIVE NEGATIVE Final  Clostridium Difficile by PCR     Status: None   Collection Time: 12/05/14  9:40 PM  Result Value Ref Range Status   C difficile by pcr NEGATIVE NEGATIVE Final     Scheduled Meds: . amLODipine  5 mg Oral Daily  . cefUROXime  250 mg Oral Q1200  . chlorhexidine  15 mL Mouth Rinse BID  . levothyroxine  25 mcg Oral QAC breakfast   Continuous Infusions: . sodium chloride 10 mL/hr at 12/09/14 2345  . pantoprozole (PROTONIX) infusion 8 mg/hr (12/11/14 2355)

## 2014-12-12 NOTE — Progress Notes (Signed)
Physical Therapy Treatment Patient Details Name: Dorette GrateLavelle Longest MRN: [id number] DOB: Jun 02, 1922 Today's Date: 12/12/2014    History of Present Illness 81 y.o. Female with history of hypertension, hypothyroidism, dyslipidemia, known aortic valve sclerosis, asymptomatic right carotid artery disease, and osteoarthritis on regular NSAIDs who was sent to the emergency room with BRBPR. Pt S/P EGD for GI Bleed.    PT Comments    Patient making slow progress. [facility] assistance for pericare. Noted dark bloody clots and RN made aware. Patient very sweet and agreeable to ambulation. Continue with current POC  Follow Up Recommendations  Home health PT;Supervision for mobility/OOB     Equipment Recommendations  None recommended by PT    Recommendations for Other Services       Precautions / Restrictions Precautions Precautions: Fall Restrictions Weight Bearing Restrictions: No    Mobility  Bed Mobility Overal bed mobility: Needs Assistance   Rolling: Min assist Sidelying to sit: Min assist       General bed mobility comments: Min A to elevate trunk into sitting. Cues for positioning  Transfers Overall transfer level: Needs assistance Equipment used: Rolling walker (2 wheeled)   Sit to Stand: Min assist         General transfer comment: Patient stood x4 with cues for hand placement and A to power up and stabilize stand  Ambulation/Gait Ambulation/Gait assistance: Min assist Ambulation Distance (Feet): 40 Feet Assistive device: Rolling walker (2 wheeled) Gait Pattern/deviations: Step-through pattern;Decreased stride length   Gait velocity interpretation: Below normal speed for age/gender General Gait Details: cues for posture and position in RW with chair to follow and assist to steer RW. Patient fatigues quickly   Stairs            Wheelchair Mobility    Modified Rankin (Stroke Patients Only)       Balance                                     Cognition Arousal/Alertness: Awake/alert Behavior During Therapy: WFL for tasks assessed/performed Overall Cognitive Status: Within Functional Limits for tasks assessed                      Exercises      General Comments        Pertinent Vitals/Pain Pain Assessment: No/denies pain    Home Living                      Prior Function            PT Goals (current goals can now be found in the care plan section) Progress towards PT goals: Progressing toward goals    Frequency  Min 3X/week    PT Plan Current plan remains appropriate    Co-evaluation             End of Session   Activity Tolerance: Patient tolerated treatment well Patient left: in chair;with call bell/phone within reach     Time: 0913-0933 PT Time Calculation (min) (ACUTE ONLY): 20 min  Charges:  $Gait Training: 8-22 mins                    G Codes:      Fredrich BirksRobinette, Julia Elizabeth 12/12/2014, 10:28 AM 12/12/2014 Fredrich Birksobinette, Julia Elizabeth PTA [phone] pager [phone] office

## 2014-12-13 LAB — BASIC METABOLIC PANEL
Anion gap: 5 (ref 5–15)
BUN: 27 mg/dL — ABNORMAL HIGH (ref 6–23)
CO2: 24 mmol/L (ref 19–32)
Calcium: 6.7 mg/dL — ABNORMAL LOW (ref 8.4–10.5)
Chloride: 113 mmol/L — ABNORMAL HIGH (ref 96–112)
Creatinine, Ser: 1.6 mg/dL — ABNORMAL HIGH (ref 0.50–1.10)
GFR calc Af Amer: 32 mL/min — ABNORMAL LOW (ref >90)
GFR, EST NON AFRICAN AMERICAN: 27 mL/min — AB (ref >90)
GLUCOSE: 88 mg/dL (ref 70–99)
POTASSIUM: 3.2 mmol/L — AB (ref 3.5–5.1)
SODIUM: 142 mmol/L (ref 135–145)

## 2014-12-13 LAB — CBC
HCT: 28.4 % — ABNORMAL LOW (ref 36.0–46.0)
Hemoglobin: 9.4 g/dL — ABNORMAL LOW (ref 12.0–15.0)
MCH: 29 pg (ref 26.0–34.0)
MCHC: 33.1 g/dL (ref 30.0–36.0)
MCV: 87.7 fL (ref 78.0–100.0)
PLATELETS: 105 10*3/uL — AB (ref 150–400)
RBC: 3.24 MIL/uL — ABNORMAL LOW (ref 3.87–5.11)
RDW: 16.4 % — AB (ref 11.5–15.5)
WBC: 10.5 10*3/uL (ref 4.0–10.5)

## 2014-12-13 MED ORDER — SODIUM CHLORIDE 0.9 % IJ SOLN
10.0000 mL | INTRAMUSCULAR | Status: DC | PRN
  Administered 2014-12-13 – 2014-12-15 (×4): 10 mL
  Filled 2014-12-13 (×3): qty 40

## 2014-12-13 MED ORDER — POTASSIUM CHLORIDE CRYS ER 20 MEQ PO TBCR
40.0000 meq | EXTENDED_RELEASE_TABLET | Freq: Once | ORAL | Status: AC
  Administered 2014-12-13: 40 meq via ORAL
  Filled 2014-12-13: qty 2

## 2014-12-13 NOTE — Progress Notes (Signed)
Subjective: No bleeding. No abdominal pain. Tolerating diet.  Objective: Vital signs in last 24 hours: Temp:  [98.4 F (36.9 C)-98.6 F (37 C)] 98.6 F (37 C) (02/26 0509) Pulse Rate:  [71-75] 71 (02/26 0509) Resp:  [17-20] 17 (02/26 0509) BP: (130-143)/(40-117) 139/40 mmHg (02/26 0509) SpO2:  [85 %-100 %] 85 % (02/26 0509) Weight:  [58.015 kg (127 lb 14.4 oz)] 58.015 kg (127 lb 14.4 oz) (02/26 0509) Weight change: -1.86 kg (-4 lb 1.6 oz) Last BM Date: 12/12/14  PE: GEN:  NAD, younger-appearing than stated age ABD:  Soft, non-tender  Lab Results: CBC    Component Value Date/Time   WBC 10.5 12/13/2014 0547   RBC 3.24* 12/13/2014 0547   RBC 3.47* 12/05/2014 1102   HGB 9.4* 12/13/2014 0547   HCT 28.4* 12/13/2014 0547   PLT 105* 12/13/2014 0547   MCV 87.7 12/13/2014 0547   MCH 29.0 12/13/2014 0547   MCHC 33.1 12/13/2014 0547   RDW 16.4* 12/13/2014 0547   LYMPHSABS 0.5* 12/05/2014 0933   MONOABS 0.5 12/05/2014 0933   EOSABS 0.0 12/05/2014 0933   BASOSABS 0.0 12/05/2014 0933   CMP     Component Value Date/Time   NA 142 12/13/2014 0547   K 3.2* 12/13/2014 0547   CL 113* 12/13/2014 0547   CO2 24 12/13/2014 0547   GLUCOSE 88 12/13/2014 0547   BUN 27* 12/13/2014 0547   CREATININE 1.60* 12/13/2014 0547   CALCIUM 6.7* 12/13/2014 0547   PROT 4.5* 12/08/2014 0330   ALBUMIN 2.6* 12/08/2014 0330   AST 25 12/08/2014 0330   ALT 14 12/08/2014 0330   ALKPHOS 35* 12/08/2014 0330   BILITOT 0.5 12/08/2014 0330   GFRNONAA 27* 12/13/2014 0547   GFRAA 32* 12/13/2014 0547   Gastric biopsies:  Negative for H. Pylori.  Assessment:  1. Acute blood loss anemia. Interval drop in Hgb without ongoing bleeding.  Perhaps this is more dilutional or equilibration. 2. Duodenal ulcer with active bleeding, unable to treat endoscopically, s/p IR-mediated coil embolization of gastroduodenal artery and hypertrophied pancreaticoduodenal artery. 3. Upper GI bleeding, from #2 above.  Bleeding has stopped.  Plan:  1.  Continue pantoprazole 40 mg po bid x 3 months, then 40 mg po qd indefinitely. 2.  No NSAIDs. 3.  Soft diet for another few days, then up-titrate. 4.  Follow CBCs; would be reluctant to pursue repeat endoscopy, despite interval drop in Hgb, unless patient has recurrent overt bleeding. 5.  Will follow.   Sheila Murphy,Sheila Murphy 12/13/2014, 9:34 AM

## 2014-12-13 NOTE — Progress Notes (Signed)
Occupational Therapy Treatment Patient Details Name: Sheila Murphy MRN: [id number] DOB: Mar 20, 1922 Today's Date: 12/13/2014    History of present illness 81 y.o. Female with history of hypertension, hypothyroidism, dyslipidemia, known aortic valve sclerosis, asymptomatic right carotid artery disease, and osteoarthritis on regular NSAIDs who was sent to the emergency room with BRBPR. Pt S/P EGD for GI Bleed.   OT comments  Pt. Continues to progress with mobility and transfers.  Cues for sequencing and safety during pivotal steps.  Will continue to follow.  States 2 sons live with her and provide 24/7 A   Follow Up Recommendations  Home health OT;Supervision/Assistance - 24 hour    Equipment Recommendations       Recommendations for Other Services      Precautions / Restrictions Precautions Precautions: Fall Restrictions Weight Bearing Restrictions: No       Mobility Bed Mobility Overal bed mobility: Needs Assistance Bed Mobility: Rolling;Sidelying to Sit Rolling: Min assist Sidelying to sit: Min assist       General bed mobility comments: Min A to elevate trunk into sitting. Cues for positioning, intial post. lob while sitting "i think im going to fall backwards". cues to scoot hips towards eob and use rail for ue support  Transfers Overall transfer level: Needs assistance Equipment used: Rolling walker (2 wheeled) Transfers: Sit to/from [employer] Sit to Stand: Min assist Stand pivot transfers: Min assist            Balance                                   ADL Overall ADL's : Needs assistance/impaired                         Toilet Transfer: Minimal assistance;Ambulation;RW Toilet Transfer Details (indicate cue type and reason): simulated with transfer eob to recliner Toileting- Clothing Manipulation and Hygiene: Total assistance;Bed level       Functional mobility during ADLs: Minimal assistance General ADL  Comments: on bed pan upon arrival, assisted off bed pan and provided peri care      Vision                     Perception     Praxis      Cognition   Behavior During Therapy: [facility] for tasks assessed/performed Overall Cognitive Status: Within Functional Limits for tasks assessed                       Extremity/Trunk Assessment               Exercises     Shoulder Instructions       General Comments      Pertinent Vitals/ Pain       Pain Assessment: No/denies pain  Home Living                                          Prior Functioning/Environment              Frequency Min 2X/week     Progress Toward Goals  OT Goals(current goals can now be found in the care plan section)  Progress towards OT goals: Progressing toward goals     Plan Discharge plan remains appropriate  Co-evaluation                 End of Session Equipment Utilized During Treatment: Rolling walker   Activity Tolerance Patient tolerated treatment well   Patient Left in chair;with call bell/phone within reach   Nurse Communication          Time: 1005-1020 OT Time Calculation (min): 15 min  Charges: OT General Charges $OT Visit: 1 Procedure OT Treatments $Self Care/Home Management : 8-22 mins  Robet LeuMorris, Loyed Wilmes Lorraine, COTA/L 12/13/2014, 10:25 AM

## 2014-12-13 NOTE — Progress Notes (Addendum)
Patient ID: Sheila Murphy, female   DOB: 09-17-1922, 81 y.o.   MRN: [id number] [facility] PROGRESS NOTE  Sheila Murphy [id number] DOB: 09-17-1922 DOA: 12/05/2014 PCP: Alva GarnetSHELTON,KIMBERLY R., MD  Brief narrative:    81 year old female with past medical history of hypertension osteoporosis, dyslipidemia, hypothyroidism who [facility] with initial complaints of rectal bleed, melanotic stool and hematemesis. Patient was admitted for upper GI bleed and subsequent evaluation revealed large duodenal ulcer on endoscopy done 12/06/2014. She was treated with PPI therapy. Hospital course was further more complicated due to ongoing blood loss and EGD was repeated 12/09/2014 which showed stomach full of blood but intervention was not possible. She underwent embolization of GDA by interventional radiology on 12/09/2014 area and she was transferred to ICU for stabilization post procedure. Patient has received 8 units of PRBC transfusion, 2 prior to the procedure and 6 during the embolization. Additional issue on admission was acute kidney injury which seems to have improved since the admission. Patient was on critical care medicine service and [facility]2014.  Assessment/Plan:     Principal Problem: Acute upper GI bleed / acute blood loss anemia secondary to active bleeding from duodenal ulcer / acute GI hemorrhagic shock - Patient presented with hematemesis on the admission. Patient was seen by GI in consultation.  - Patient underwent duodenal endoscopy on 12/06/2014 with findings of large wad no ulcer. She was started on PPI therapy. Patient then found to have ongoing blood loss and EGD was repeated 12/09/2014 at which time it showed stomach full of blood. Patient underwent embolization of a duodenal artery on 12/09/2014. Patient has received total of 8 units of PRBC transfusion, 2 prior to the procedure and 6 during the embolization. - Per GI recommendations patient will continue  Protonix indefinitely on discharge. - This morning hemoglobin is 9.4. Continue to monitor CBC.  Active Problems:   Essential hypertension - Continue Norvasc 5 mg daily    Hypothyroidism - Continue Synthroid 25 mcg daily    Urinary tract infection, E.Coli UTI / Leukocytosis  - Urine culture grew Escherichia coli - Cefuroxime started 12/08/2014. Stopped cefuroxime today.    Acute kidney injury / acute tubular necrosis - Acute kidney injury likely secondary to ATN from hemorrhagic shock - Creatinine improving with IV fluids, 1.99 -> 1.6 in past 48 hours.    Hypokalemia - Secondary to GI losses. Potassium being supplemented.    Protein-calorie malnutrition, severe - Likely secondary to acute illness, hemorrhagic shock - Diet as tolerated.    DVT Prophylaxis  - SCDs bilaterally due to risk of bleeding   Code Status: Full.  Family Communication:  plan of care discussed with the patient, family not at the bedside. Disposition Plan: Currently on soft diet, per GI continue for next day or so. Not still stable for discharge.  IV access:  Peripheral IV  Procedures and diagnostic studies:    EGD 2/19 >> large duodenal ulcer  EGD 2/22 >> stomach full of blood, visualization and intervention impossible  Ir Angiogram Visceral Selective 12/09/2014 Technically successful percutaneous coil embolization of the GDA and a hypertrophied pancreaticoduodenal artery for active extravasation.     Ir Angiogram Selective Each Additional Vessel 12/09/2014 Technically successful percutaneous coil embolization of the GDA and a hypertrophied pancreaticoduodenal artery for active extravasation.     Ir Angiogram Selective Each Additional Vessel 12/09/2014   Technically successful percutaneous coil embolization of the GDA and a hypertrophied pancreaticoduodenal artery for active extravasation.    Ir Angiogram Follow  Up Study 12/09/2014 Technically successful percutaneous coil embolization of the GDA and a  hypertrophied pancreaticoduodenal artery for active extravasation.    Ir US Guide Vasc Access Right 12/09/2014  Technically successful percutaneous coil embolization of the GDA and a hypertrophied pancreaticoduodenal artery for active extravasation.    Dg Chest Port 1 View 12/11/2014 Right central venous line with tip in distal SVC.  Bilateral small effusions.     Ir Rebekah [place] Hemorr Lymph [employer] mapping 12/09/2014  Technically successful percutaneous coil embolization of the GDA and a hypertrophied pancreaticoduodenal artery for active extravasation.    Medical Consultants:  Gastroenterology Interventional radiology Surgery   Other Consultants:  Physical therapy Occupational therapy  IAnti-Infectives:   Cefuroxime 12/08/2014 --> 12/13/2014 Rocephin 12/05/2014   Manson Passey, MD  [facility] Pager [phone]  If 7PM-7AM, please contact night-coverage [email or web address] Password [facility] 12/13/2014, 11:55 AM   LOS: 8 days    HPI/Subjective: No acute overnight events.  Objective: Filed Vitals:   12/12/14 0503 12/12/14 1444 12/12/14 2147 12/13/14 0509  BP: 149/48 143/57 130/117 139/40  Pulse: 77 74 75 71  Temp: 98.4 F (36.9 C) 98.6 F (37 C) 98.4 F (36.9 C) 98.6 F (37 C)  TempSrc: Oral Oral Oral Oral  Resp: Height:      Weight:    58.015 kg (127 lb 14.4 oz)  SpO2: 97% 100% 96% 85%    Intake/Output Summary (Last 24 hours) at 12/13/14 1155 Last data filed at 12/12/14 2118  Gross per 24 hour  Intake    360 ml  Output      0 ml  Net    360 ml    Exam:   General:  Pt is not in acute distress  Cardiovascular: RRR, S1/S2 (+)  Respiratory: Bilateral air entry, no wheezing  Abdomen: Nontender, nondistended, appreciate bowel sounds  Extremities: No leg swelling, pulses palpable  Neuro: Nonfocal  Data Reviewed: Basic Metabolic Panel:  Recent Labs Lab 12/11/14 0519 12/11/14 1245 12/11/14 2012 12/12/14 0437  12/13/14 0547  NA 144 146* 144 145 142  K 4.0 3.9 3.5 3.3* 3.2*  CL 117* 113* 112 112 113*  CO2 GLUCOSE 95 88 89 83 88  BUN 49* 46* 43* 40* 27*  CREATININE 2.22* 2.13* 1.99* 1.88* 1.60*  CALCIUM 6.7* 6.9* 7.0* 7.0* 6.7*   Liver Function Tests:  Recent Labs Lab 12/08/14 0330  AST 25  ALT 14  ALKPHOS 35*  BILITOT 0.5  PROT 4.5*  ALBUMIN 2.6*   No results for input(s): LIPASE, AMYLASE in the last 168 hours. No results for input(s): AMMONIA in the last 168 hours. CBC:  Recent Labs Lab 12/10/14 1100 12/10/14 1700 12/10/14 2344 12/11/14 1130 12/13/14 0547  WBC 22.3* 20.8* 19.3* 19.4* 10.5  HGB 14.8 13.2 11.6* 11.4* 9.4*  HCT 41.1 37.1 33.5* 33.0* 28.4*  MCV 80.7 81.4 83.5 85.1 87.7  PLT 39* 51* 60* 74* 105*   Cardiac Enzymes: No results for input(s): CKTOTAL, CKMB, CKMBINDEX, TROPONINI in the last 168 hours. BNP: Invalid input(s): POCBNP CBG:  Recent Labs Lab 12/09/14 1709  GLUCAP 127*    Culture, Urine     Status: None   Collection Time: 12/05/14  9:50 AM  Result Value Ref Range Status   Specimen Description URINE, RANDOM  Final   Special Requests NONE  Final   Colony Count   Final   Culture   Final  ESCHERICHIA COLI    Report Status 12/07/2014 FINAL  Final   Organism ID, Bacteria ESCHERICHIA COLI  Final      Susceptibility   Escherichia coli - MIC*    AMPICILLIN >=32 RESISTANT Resistant     CEFAZOLIN <=4 SENSITIVE Sensitive     CEFTRIAXONE <=1 SENSITIVE Sensitive     CIPROFLOXACIN <=0.25 SENSITIVE Sensitive     GENTAMICIN 2 SENSITIVE Sensitive     LEVOFLOXACIN <=0.12 SENSITIVE Sensitive     NITROFURANTOIN <=16 SENSITIVE Sensitive     TOBRAMYCIN 2 SENSITIVE Sensitive     TRIMETH/SULFA <=20 SENSITIVE Sensitive     PIP/TAZO <=4 SENSITIVE Sensitive     * ESCHERICHIA COLI  MRSA PCR Screening     Status: None   Collection Time: 12/05/14  8:15 PM  Result Value Ref Range Status   MRSA by PCR NEGATIVE NEGATIVE Final  Clostridium  Difficile by PCR     Status: None   Collection Time: 12/05/14  9:40 PM  Result Value Ref Range Status   C difficile by pcr NEGATIVE NEGATIVE Final     Scheduled Meds: . amLODipine  5 mg Oral Daily  . cefUROXime  250 mg Oral Q1200  . feeding supplement (ENSURE COMPLETE)  237 mL Oral BID BM  . levothyroxine  25 mcg Oral QAC breakfast  . pantoprazole  40 mg Oral BID AC   Continuous Infusions: . sodium chloride 10 mL/hr at 12/09/14 2345

## 2014-12-14 NOTE — Progress Notes (Signed)
Patient ID: Sheila Murphy, female   DOB: [birth year], 81 y.o.   MRN: [id number] [facility] Progress [facility]  Sheila Murphy 81 y.o. [birth year]   Subjective: Sitting up in bed eating lunch. Denies abdominal pain. Feels ok.  Objective: Vital signs in last 24 hours: Filed Vitals:   12/14/14 0459  BP: 162/45  Pulse: 73  Temp: 99.4 F (37.4 C)  Resp: 17    Physical Exam: Gen: alert, pleasant, elderly, no acute distress, well-nourished Abd: soft, nontender, nondistended  Lab Results:  Recent Labs  12/12/14 0437 12/13/14 0547  NA 145 142  K 3.3* 3.2*  CL 112 113*  CO2 25 24  GLUCOSE 83 88  BUN 40* 27*  CREATININE 1.88* 1.60*  CALCIUM 7.0* 6.7*   No results for input(s): AST, ALT, ALKPHOS, BILITOT, PROT, ALBUMIN in the last 72 hours.  Recent Labs  12/13/14 0547  WBC 10.5  HGB 9.4*  HCT 28.4*  MCV 87.7  PLT 105*   No results for input(s): LABPROT, INR in the last 72 hours.    Assessment/Plan: S/P duodenal ulcer bleed without further bleeding. Hgb 9.4 yesterday. Continue PPI PO BID for 3 months as outpt and then QD indefinitely. Will sign off. Call if questions.   Jaretzy Lhommedieu C. 12/14/2014, 3:00 PM

## 2014-12-14 NOTE — Progress Notes (Signed)
[facility] PROGRESS NOTE  Sheila Murphy [id number] DOB: Aug 23, 1922 DOA: 12/05/2014 PCP: Alva GarnetSHELTON,KIMBERLY R., MD  Assessment/Plan: 81 y/o female with PMH of HTN, DJD, HPL, Hypothyroidism who [facility] of rectal bleed, melanotic stool and hematemesis. Patient was admitted for upper GI bleed and subsequent evaluation revealed large duodenal ulcer on endoscopy done 12/06/2014. She was treated with PPI therapy. [facility] course was further more complicated due to ongoing blood loss and EGD was repeated 12/09/2014 which showed stomach full of blood but intervention was not possible. She underwent embolization of GDA by interventional radiology on 12/09/2014 area and she was transferred to ICU for stabilization post procedure. Patient has received 8 units of PRBC transfusion, 2 prior to the procedure and 6 during the embolization. Additional issue on admission was acute kidney injury which seems to have improved since the admission.    1. Acute upper GI bleed / acute blood loss anemia secondary to active bleeding from duodenal ulcer / acute GI hemorrhagic shock; Patient presented with hematemesis on the admission. Patient was seen by GI in consultation. Patient underwent duodenal endoscopy on 12/06/2014 with findings of large wad no ulcer. She was started on PPI therapy. Patient then found to have ongoing blood loss and EGD was repeated 12/09/2014 at which time it showed stomach full of blood. Patient underwent embolization of a duodenal artery on 12/09/2014. Patient has received total of 8 units of PRBC transfusion, 2 prior to the procedure and 6 during the embolization. - Per GI recommendations patient will continue Protonix indefinitely on discharge. Continue to monitor CBC, since Hg is still tern down; TF prn; at this time no obvious recurrent bleeding   2. Essential hypertension; Continue Norvasc 5 mg daily 3. Hypothyroidism; Continue Synthroid 25 mcg daily 4.  Urinary tract infection, E.Coli UTI / Leukocytosis; Urine culture grew Escherichia coli; patient completed Cefuroxime 02/21-2/26; afebrile, leukocytosis resolved  5. Acute kidney injury / acute tubular necrosis; Acute kidney injury likely secondary to ATN from hemorrhagic shock - Creatinine improving with IV fluids, 1.99 -> 1.6 in past 48 hours.hold diuretics  6. Hypokalemia Secondary to GI losses. Potassium being supplemented. 7. Protein-calorie malnutrition, severeLikely secondary to acute illness, hemorrhagic shock; will cont Diet as tolerated.    DVT Prophylaxis  - SCDs bilaterally due to risk of bleeding   Code Status: full Family Communication: d/w patient (indicate person spoken with, relationship, and if by phone, the number) Disposition Plan: HHC upon discharge likely 24-48 hrs    Consultants: Gastroenterology Interventional radiology Surgery   Procedures: EGD 2/19 >> large duodenal ulcer  EGD 2/22 >> stomach full of blood, visualization and intervention impossible  Ir Angiogram Visceral Selective 12/09/2014 Technically successful percutaneous coil embolization of the GDA and a hypertrophied pancreaticoduodenal artery for active extravasation.   Ir Angiogram Selective Each Additional Vessel 12/09/2014 Technically successful percutaneous coil embolization of the GDA and a hypertrophied pancreaticoduodenal artery for active extravasation.   Ir Angiogram Selective Each Additional Vessel 12/09/2014 Technically successful percutaneous coil embolization of the GDA and a hypertrophied pancreaticoduodenal artery for active extravasation.   Ir Angiogram Follow Up Study 12/09/2014 Technically successful percutaneous coil embolization of the GDA and a hypertrophied pancreaticoduodenal artery for active extravasation.   Ir [place] Guide Vasc Access Right 12/09/2014 Technically successful percutaneous coil embolization of the GDA and a hypertrophied pancreaticoduodenal artery for  active extravasation.   Dg Chest Port 1 View 12/11/2014 Right central venous line with tip in distal SVC. Bilateral small effusions.   Ir Shana ChuteEmbo  Art [facility] ViewVen Hemorr Lymph [employer] mapping 12/09/2014 Technically successful percutaneous coil embolization of the GDA and a hypertrophied pancreaticoduodenal artery for active extravasation.   Antibiotics: Cefuroxime 12/08/2014 --> 12/13/2014  Rocephin 12/05/2014   (indicate start date, and stop date if known)  HPI/Subjective: Alert   Objective: Filed Vitals:   12/14/14 0459  BP: 162/45  Pulse: 73  Temp: 99.4 F (37.4 C)  Resp: 17    Intake/Output Summary (Last 24 hours) at 12/14/14 0951 Last data filed at 12/13/14 2117  Gross per 24 hour  Intake     13 ml  Output      0 ml  Net     13 ml   Filed Weights   12/12/14 0500 12/13/14 0509 12/14/14 0459  Weight: 59.875 kg (132 lb) 58.015 kg (127 lb 14.4 oz) 58.151 kg (128 lb 3.2 oz)    Exam:   General:  Alert, denies pains   Cardiovascular: s1,s2 rrr  Respiratory: few crackles at bases   Abdomen: soft, nt,nd   Musculoskeletal: no leg edema   Data Reviewed: Basic Metabolic Panel:  Recent Labs Lab 12/11/14 0519 12/11/14 1245 12/11/14 2012 12/12/14 0437 12/13/14 0547  NA 144 146* 144 145 142  K 4.0 3.9 3.5 3.3* 3.2*  CL 117* 113* 112 112 113*  CO2 23 23 25 25 24   GLUCOSE 95 88 89 83 88  BUN 49* 46* 43* 40* 27*  CREATININE 2.22* 2.13* 1.99* 1.88* 1.60*  CALCIUM 6.7* 6.9* 7.0* 7.0* 6.7*   Liver Function Tests:  Recent Labs Lab 12/08/14 0330  AST 25  ALT 14  ALKPHOS 35*  BILITOT 0.5  PROT 4.5*  ALBUMIN 2.6*   No results for input(s): LIPASE, AMYLASE in the last 168 hours. No results for input(s): AMMONIA in the last 168 hours. CBC:  Recent Labs Lab 12/10/14 1100 12/10/14 1700 12/10/14 2344 12/11/14 1130 12/13/14 0547  WBC 22.3* 20.8* 19.3* 19.4* 10.5  HGB 14.8 13.2 11.6* 11.4* 9.4*  HCT 41.1 37.1 33.5* 33.0* 28.4*  MCV 80.7 81.4  83.5 85.1 87.7  PLT 39* 51* 60* 74* 105*   Cardiac Enzymes: No results for input(s): CKTOTAL, CKMB, CKMBINDEX, TROPONINI in the last 168 hours. BNP (last 3 results) No results for input(s): BNP in the last 8760 hours.  ProBNP (last 3 results) No results for input(s): PROBNP in the last 8760 hours.  CBG:  Recent Labs Lab 12/09/14 1709  GLUCAP 127*    Recent Results (from the past 240 hour(s))  Culture, Urine     Status: None   Collection Time: 12/05/14  9:50 AM  Result Value Ref Range Status   Specimen Description URINE, RANDOM  Final   Special Requests NONE  Final   Colony Count   Final    >=100,000 COLONIES/ML [facility]formed at [facility]    Culture   Final    ESCHERICHIA COLI Performed at [facility]    Report Status 12/07/2014 FINAL  Final   Organism ID, Bacteria ESCHERICHIA COLI  Final      Susceptibility   Escherichia coli - MIC*    AMPICILLIN >=32 RESISTANT Resistant     CEFAZOLIN <=4 SENSITIVE Sensitive     CEFTRIAXONE <=1 SENSITIVE Sensitive     CIPROFLOXACIN <=0.25 SENSITIVE Sensitive     GENTAMICIN 2 SENSITIVE Sensitive     LEVOFLOXACIN <=0.12 SENSITIVE Sensitive     NITROFURANTOIN <=16 SENSITIVE Sensitive     TOBRAMYCIN 2 SENSITIVE Sensitive     TRIMETH/SULFA <=  20 SENSITIVE Sensitive     PIP/TAZO <=4 SENSITIVE Sensitive     * ESCHERICHIA COLI  MRSA PCR Screening     Status: None   Collection Time: 12/05/14  8:15 PM  Result Value Ref Range Status   MRSA by PCR NEGATIVE NEGATIVE Final    Comment:        The GeneXpert MRSA Assay (FDA approved for NASAL specimens only), is one component of a comprehensive MRSA colonization surveillance program. It is not intended to diagnose MRSA infection nor to guide or monitor treatment for MRSA infections.   Clostridium Difficile by PCR     Status: None   Collection Time: 12/05/14  9:40 PM  Result Value Ref Range Status   C difficile by pcr NEGATIVE NEGATIVE Final     Studies: No results  found.  Scheduled Meds: . amLODipine  5 mg Oral Daily  . antiseptic oral rinse  7 mL Mouth Rinse q12n4p  . chlorhexidine  15 mL Mouth Rinse BID  . feeding supplement (ENSURE COMPLETE)  237 mL Oral BID BM  . levothyroxine  25 mcg Oral QAC breakfast  . pantoprazole  40 mg Oral BID AC  . sodium chloride  10 mL Intravenous Q12H  . sodium chloride  3 mL Intravenous Q12H   Continuous Infusions: . sodium chloride 10 mL/hr at 12/09/14 2345    Principal Problem:   GIB (gastrointestinal bleeding) Active Problems:   Essential hypertension   Aortic valve sclerosis   Acute blood loss anemia   Syncope due to orthostatic hypotension   Hypothyroidism   Abnormal urinalysis   Hypokalemia   Protein-calorie malnutrition, severe   Bleeding gastrointestinal    Time spent: >35 minutes     Esperanza Sheets  [facility] Pager [phone]. If 7PM-7AM, please contact night-coverage at [email or web address], password [facility] 12/14/2014, 9:51 AM  LOS: 9 days

## 2014-12-15 LAB — CBC
HEMATOCRIT: 31.5 % — AB (ref 36.0–46.0)
HEMOGLOBIN: 10.7 g/dL — AB (ref 12.0–15.0)
MCH: 29.7 pg (ref 26.0–34.0)
MCHC: 34 g/dL (ref 30.0–36.0)
MCV: 87.5 fL (ref 78.0–100.0)
PLATELETS: 184 10*3/uL (ref 150–400)
RBC: 3.6 MIL/uL — ABNORMAL LOW (ref 3.87–5.11)
RDW: 16 % — ABNORMAL HIGH (ref 11.5–15.5)
WBC: 9.7 10*3/uL (ref 4.0–10.5)

## 2014-12-15 MED ORDER — PANTOPRAZOLE SODIUM 40 MG PO TBEC: 40.0000 mg | DELAYED_RELEASE_TABLET | Freq: Two times a day (BID) | ORAL | Status: AC

## 2014-12-15 MED ORDER — POTASSIUM CHLORIDE CRYS ER 20 MEQ PO TBCR
40.0000 meq | EXTENDED_RELEASE_TABLET | Freq: Once | ORAL | Status: AC
  Administered 2014-12-15: 40 meq via ORAL
  Filled 2014-12-15: qty 2

## 2014-12-15 NOTE — Discharge Summary (Addendum)
Physician Discharge Summary  Sheila Murphy IPJ:[id number] DOB: 01-02-1922 DOA: 12/05/2014  PCP: Salena Saner., MD  Admit date: 12/05/2014 Discharge date: 12/16/2014  Time spent: >35 minutes  Recommendations for Outpatient Follow-up:  HHC-PT/OT F/u with PCP in 1 week F/u with GI in 3-4 weeks   Discharge Diagnoses:  Principal Problem:   GIB (gastrointestinal bleeding) Active Problems:   Essential hypertension   Aortic valve sclerosis   Acute blood loss anemia   Syncope due to orthostatic hypotension   Hypothyroidism   Abnormal urinalysis   Hypokalemia   Protein-calorie malnutrition, severe   Bleeding gastrointestinal   Discharge Condition: stable   Diet recommendation: low sodium   Filed Weights   12/13/14 0509 12/14/14 0459 12/15/14 0605  Weight: 58.015 kg (127 lb 14.4 oz) 58.151 kg (128 lb 3.2 oz) 55.248 kg (121 lb 12.8 oz)    History of present illness:  81 y/o female with PMH of HTN, DJD, HPL, Hypothyroidism who presented to [facility] with initial complaints of rectal bleed, melanotic stool and hematemesis. Patient was admitted for upper GI bleed and subsequent evaluation revealed large duodenal ulcer on endoscopy done 12/06/2014. She was treated with PPI therapy. [facility] course was further more complicated due to ongoing blood loss and EGD was repeated 12/09/2014 which showed stomach full of blood but intervention was not possible. She underwent embolization of GDA by interventional radiology on 12/09/2014 area and she was transferred to ICU for stabilization post procedure. Patient has received 8 units of PRBC transfusion, 2 prior to the procedure and 6 during the embolization. Additional issue on admission was acute kidney injury which seems to have improved since the admission.    Hospital Course:  1. Acute upper GI bleed / acute blood loss anemia secondary to active bleeding from duodenal ulcer / acute GI hemorrhagic shock; Patient presented with hematemesis on the  admission. Patient was seen by GI in consultation. Patient underwent duodenal endoscopy on 12/06/2014 with findings of large wad no ulcer. She was started on PPI therapy. Patient then found to have ongoing blood loss and EGD was repeated 12/09/2014 at which time it showed stomach full of blood. Patient underwent embolization of a duodenal artery on 12/09/2014. Patient has received total of 8 units of PRBC transfusion, 2 prior to the procedure and 6 during the embolization. -patient monitored for sevreal days, HG remained stable, with no obvious recurrent bleeding at this time; discontinued all NSAIDs, ASA at discharge  -Per GI recommendations patient will continue Protonix BID for 3 month then indefinitely on discharge. 2. Essential hypertension; Continue Norvasc 5 mg daily  3. Hypothyroidism; Continue Synthroid 25 mcg daily 4. Urinary tract infection, E.Coli UTI / Leukocytosis; Urine culture grew Escherichia coli; patient completed Cefuroxime 02/21-2/26; afebrile, leukocytosis resolved  5. Acute kidney injury / acute tubular necrosis; Acute kidney injury likely secondary to ATN from hemorrhagic shock - Creatinine improving with IV fluids, 1.99 -> 1.6 in past 48 hours. hold diuretics at discharge  6. Hypokalemia Secondary to GI losses. Potassium supplemented PO at discharge, hold hctz  7. Protein-calorie malnutrition, severeLikely secondary to acute illness, hemorrhagic shock; will cont Diet as tolerated.  8. HTN, resume amlodipine, hold hctz; titrate meds as outpatient as needed    D/w patient, updated her son with the plan of care   Procedures: EGD 2/19 >> large duodenal ulcer  EGD 2/22 >> stomach full of blood, visualization and intervention impossible  Ir Angiogram Visceral Selective 12/09/2014 Technically successful percutaneous coil embolization of the GDA and a hypertrophied  pancreaticoduodenal artery for active extravasation.   Ir Angiogram Selective Each Additional Vessel  12/09/2014 Technically successful percutaneous coil embolization of the GDA and a hypertrophied pancreaticoduodenal artery for active extravasation.   Ir Angiogram Selective Each Additional Vessel 12/09/2014 Technically successful percutaneous coil embolization of the GDA and a hypertrophied pancreaticoduodenal artery for active extravasation.   Ir Angiogram Follow Up Study 12/09/2014 Technically successful percutaneous coil embolization of the GDA and a hypertrophied pancreaticoduodenal artery for active extravasation.   Ir US Guide Vasc Access Right 12/09/2014 Technically successful percutaneous coil embolization of the GDA and a hypertrophied pancreaticoduodenal artery for active extravasation.   Dg Chest Port 1 View 12/11/2014 Right central venous line with tip in distal SVC. Bilateral small effusions.   [place] mapping 12/09/2014 Technically successful percutaneous coil embolization of the GDA and a hypertrophied pancreaticoduodenal artery for active extravasation.   Antibiotics: Cefuroxime 12/08/2014 --> 12/13/2014  Rocephin 12/05/2014  (indicate start date, and stop date if known)   (i.e. Studies not automatically included, echos, thoracentesis, etc; not x-rays)  Consultations: Gastroenterology Interventional radiology Surgery   Discharge Exam: Filed Vitals:   12/15/14 0605  BP: 143/63  Pulse: 75  Temp: 98.6 F (37 C)  Resp: 18    General: alert, oriented  Cardiovascular: s1,s2 rrr Respiratory: CTA BL  Discharge Instructions  Discharge Instructions    Diet - low sodium heart healthy    Complete by:  As directed      Discharge instructions    Complete by:  As directed   Please follow up with primary care doctor in 1 week     Increase activity slowly    Complete by:  As directed             Medication List    STOP taking these medications        alendronate 70 MG tablet  Commonly known as:  FOSAMAX      ALEVE 220 MG tablet  Generic drug:  naproxen sodium     aspirin 81 MG EC tablet     chlorthalidone 25 MG tablet  Commonly known as:  HYGROTON      TAKE these medications        acetaminophen 650 MG CR tablet  Commonly known as:  TYLENOL  Take 650 mg by mouth every 8 (eight) hours as needed for pain.     amLODipine 10 MG tablet  Commonly known as:  NORVASC  Take 1 tablet (10 mg total) by mouth daily.     CALCIUM PO  Take by mouth.     cholecalciferol 1000 UNITS tablet  Commonly known as:  VITAMIN D  Take 1,000 Units by mouth daily.     levothyroxine 25 MCG tablet  Commonly known as:  SYNTHROID, LEVOTHROID  Take 25 mcg by mouth daily before breakfast.     lovastatin 20 MG tablet  Commonly known as:  MEVACOR  TAKE ONE TABLET BY MOUTH AT BEDTIME     multivitamin tablet  Take 1 tablet by mouth daily.     pantoprazole 40 MG tablet  Commonly known as:  PROTONIX  Take 1 tablet (40 mg total) by mouth 2 (two) times daily before a meal.     vitamin E 400 UNIT capsule  Take 400 Units by mouth daily.       Allergies  Allergen Reactions  . Penicillins        Follow-up Information    Follow up with  Salena Saner., MD In 2 weeks.   Specialty:  Internal Medicine   Contact information:   [address] [place] [address] [phone]        The results of significant diagnostics from this hospitalization (including imaging, microbiology, ancillary and laboratory) are listed below for reference.    Significant Diagnostic Studies: Ir Angiogram Visceral Selective  12/09/2014   INDICATION: Acute upper GI bleed from duodenal ulcer. Patient is currently hemodynamically unstable.  Note, patient has history of chronic renal insufficiency (preprocedural creatinine -1.2), however the patient was deemed not a surgical candidate given her advanced age. Given her multiple medical comorbidities and unstable state, the decision was made to proceed with  potential life saving mesenteric arteriogram and potential percutaneous embolization.  EXAM: 1. ULTRASOUND GUIDANCE FOR ARTERIAL ACCESS 2. CELIAC ARTERIOGRAM 3. GASTRODUODENAL ARTERIOGRAM AND PERCUTANEOUS COIL EMBOLIZATION 4. PANCREATICODUODENAL ARTERIOGRAM AND PERCUTANEOUS COIL EMBOLIZATION  COMPARISON:  NONE  MEDICATIONS: Patient continuing monitored and supported by the anesthesia service.  CONTRAST:  45 mL OMNIPAQUE IOHEXOL 300 MG/ML  SOLN  FLUOROSCOPY TIME:  11 minutes.  12 seconds (6,195 mGy)  COMPLICATIONS: None immediate  TECHNIQUE: Informed written consent was obtained from the patient's son after a discussion of the risks, benefits and alternatives to treatment. Questions regarding the procedure were encouraged and answered. A timeout was performed prior to the initiation of the procedure.  The right groin was prepped and draped in the usual sterile fashion, and a sterile drape was applied covering the operative field. Maximum barrier sterile technique with sterile gowns and gloves were used for the procedure. A timeout was performed prior to the initiation of the procedure. Local anesthesia was provided with 1% lidocaine.  The right femoral head was marked fluoroscopically. Under ultrasound guidance, the right common femoral artery was accessed with a micropuncture kit after the overlying soft tissues were anesthetized with 1% lidocaine. An ultrasound image was saved for documentation purposes. The micropuncture sheath was exchanged for a 5 [place] vascular sheath over a Bentson wire. A closure arteriogram was performed through the side of the sheath confirming access within the right common femoral artery.  Over a Bentson wire, a Mickelson catheter was advanced to the level of the thoracic aorta where it was back bled and flushed. The catheter was then utilized to select the celiac artery. A celiac arteriogram was performed.  With the use of a synchro 14 micro catheter, a regular Renegade micro catheter  was advanced through the gastroduodenal artery into the extravasated duodenal ulcer. Limited contrast injection confirmed appropriate positioning and the GDA was back coiled to near its origin.  Post embolization arteriogram demonstrated a hypertrophied pancreaticoduodenal branch which appeared to provide collateral supply to the area of extravasation within the duodenum. As such, the micro catheter was advanced into the distal aspect of the pancreaticoduodenal artery. Contrast injection confirmed appropriate positioning and the vessel was back coiled to near its origin.  The microcatheter was retracted into the proper hepatic artery and a proper hepatic arteriogram was performed. The micro catheter was removed and a completion celiac arteriogram was performed via the [facility] catheter. Images were reviewed and the procedure was terminated.  All wires, catheters and sheaths were removed from the patient. Hemostasis was achieved at the right groin access site with deployment of an Exoseal closure device. A dressing was placed. The patient tolerated the procedure well without immediate postprocedural complication and was noted to be hemodynamically stable at the time of procedure completion.  FINDINGS: Celiac arteriogram  demonstrates frank extravasation from a branch arising from the mid aspect of the GDA. All branches of the celiac artery appear diffusely spasmodic compatible with hypovolemic state.  With the use of a a micro wire, the micro catheter was advanced into the area of active extravasation and the GDA was subsequently back coiled to near its origin.  Post GDA arteriogram demonstrates potential persistent ill-defined supply to the descending duodenum via hypertrophied blanch of the pancreaticoduodenal artery. Note was also made of retrograde supply to the proximal SMA. As such, this branch was cannulated and also back coiled to near its origin.  Completion arteriograms performed via the micro catheter from  the common hepatic and via the Mickelson catheter from the celiac did not demonstrate any persistent supply to the area of active extravasation within the descending segment of the duodenum.  Completion superior mesenteric arteriogram was not performed given patient's renal insufficiency  IMPRESSION: Technically successful percutaneous coil embolization of the GDA and a hypertrophied pancreaticoduodenal artery for active extravasation.   Electronically Signed   By: Sandi Mariscal M.D.   On: 12/09/2014 17:27   Ir Angiogram Selective Each Additional Vessel  12/09/2014   INDICATION: Acute upper GI bleed from duodenal ulcer. Patient is currently hemodynamically unstable.  Note, patient has history of chronic renal insufficiency (preprocedural creatinine -1.2), however the patient was deemed not a surgical candidate given her advanced age. Given her multiple medical comorbidities and unstable state, the decision was made to proceed with potential life saving mesenteric arteriogram and potential percutaneous embolization.  EXAM: 1. ULTRASOUND GUIDANCE FOR ARTERIAL ACCESS 2. CELIAC ARTERIOGRAM 3. GASTRODUODENAL ARTERIOGRAM AND PERCUTANEOUS COIL EMBOLIZATION 4. PANCREATICODUODENAL ARTERIOGRAM AND PERCUTANEOUS COIL EMBOLIZATION  COMPARISON:  NONE  MEDICATIONS: Patient continuing monitored and supported by the anesthesia service.  CONTRAST:  45 mL OMNIPAQUE IOHEXOL 300 MG/ML  SOLN  FLUOROSCOPY TIME:  11 minutes.  12 seconds (1,031 mGy)  COMPLICATIONS: None immediate  TECHNIQUE: Informed written consent was obtained from the patient's son after a discussion of the risks, benefits and alternatives to treatment. Questions regarding the procedure were encouraged and answered. A timeout was performed prior to the initiation of the procedure.  The right groin was prepped and draped in the usual sterile fashion, and a sterile drape was applied covering the operative field. Maximum barrier sterile technique with sterile gowns and  gloves were used for the procedure. A timeout was performed prior to the initiation of the procedure. Local anesthesia was provided with 1% lidocaine.  The right femoral head was marked fluoroscopically. Under ultrasound guidance, the right common femoral artery was accessed with a micropuncture kit after the overlying soft tissues were anesthetized with 1% lidocaine. An ultrasound image was saved for documentation purposes. The micropuncture sheath was exchanged for a 5 [place] vascular sheath over a Bentson wire. A closure arteriogram was performed through the side of the sheath confirming access within the right common femoral artery.  Over a Bentson wire, a Mickelson catheter was advanced to the level of the thoracic aorta where it was back bled and flushed. The catheter was then utilized to select the celiac artery. A celiac arteriogram was performed.  With the use of a synchro 14 micro catheter, a regular Renegade micro catheter was advanced through the gastroduodenal artery into the extravasated duodenal ulcer. Limited contrast injection confirmed appropriate positioning and the GDA was back coiled to near its origin.  Post embolization arteriogram demonstrated a hypertrophied pancreaticoduodenal branch which appeared to provide collateral supply to the area of  extravasation within the duodenum. As such, the micro catheter was advanced into the distal aspect of the pancreaticoduodenal artery. Contrast injection confirmed appropriate positioning and the vessel was back coiled to near its origin.  The microcatheter was retracted into the proper hepatic artery and a proper hepatic arteriogram was performed. The micro catheter was removed and a completion celiac arteriogram was performed via the [facility] catheter. Images were reviewed and the procedure was terminated.  All wires, catheters and sheaths were removed from the patient. Hemostasis was achieved at the right groin access site with deployment of an  Exoseal closure device. A dressing was placed. The patient tolerated the procedure well without immediate postprocedural complication and was noted to be hemodynamically stable at the time of procedure completion.  FINDINGS: Celiac arteriogram demonstrates frank extravasation from a branch arising from the mid aspect of the GDA. All branches of the celiac artery appear diffusely spasmodic compatible with hypovolemic state.  With the use of a a micro wire, the micro catheter was advanced into the area of active extravasation and the GDA was subsequently back coiled to near its origin.  Post GDA arteriogram demonstrates potential persistent ill-defined supply to the descending duodenum via hypertrophied blanch of the pancreaticoduodenal artery. Note was also made of retrograde supply to the proximal SMA. As such, this branch was cannulated and also back coiled to near its origin.  Completion arteriograms performed via the micro catheter from the common hepatic and via the Mickelson catheter from the celiac did not demonstrate any persistent supply to the area of active extravasation within the descending segment of the duodenum.  Completion superior mesenteric arteriogram was not performed given patient's renal insufficiency  IMPRESSION: Technically successful percutaneous coil embolization of the GDA and a hypertrophied pancreaticoduodenal artery for active extravasation.   Electronically Signed   By: Sandi Mariscal M.D.   On: 12/09/2014 17:27   Ir Angiogram Selective Each Additional Vessel  12/09/2014   INDICATION: Acute upper GI bleed from duodenal ulcer. Patient is currently hemodynamically unstable.  Note, patient has history of chronic renal insufficiency (preprocedural creatinine -1.2), however the patient was deemed not a surgical candidate given her advanced age. Given her multiple medical comorbidities and unstable state, the decision was made to proceed with potential life saving mesenteric arteriogram and  potential percutaneous embolization.  EXAM: 1. ULTRASOUND GUIDANCE FOR ARTERIAL ACCESS 2. CELIAC ARTERIOGRAM 3. GASTRODUODENAL ARTERIOGRAM AND PERCUTANEOUS COIL EMBOLIZATION 4. PANCREATICODUODENAL ARTERIOGRAM AND PERCUTANEOUS COIL EMBOLIZATION  COMPARISON:  NONE  MEDICATIONS: Patient continuing monitored and supported by the anesthesia service.  CONTRAST:  45 mL OMNIPAQUE IOHEXOL 300 MG/ML  SOLN  FLUOROSCOPY TIME:  11 minutes.  12 seconds (0,175 mGy)  COMPLICATIONS: None immediate  TECHNIQUE: Informed written consent was obtained from the patient's son after a discussion of the risks, benefits and alternatives to treatment. Questions regarding the procedure were encouraged and answered. A timeout was performed prior to the initiation of the procedure.  The right groin was prepped and draped in the usual sterile fashion, and a sterile drape was applied covering the operative field. Maximum barrier sterile technique with sterile gowns and gloves were used for the procedure. A timeout was performed prior to the initiation of the procedure. Local anesthesia was provided with 1% lidocaine.  The right femoral head was marked fluoroscopically. Under ultrasound guidance, the right common femoral artery was accessed with a micropuncture kit after the overlying soft tissues were anesthetized with 1% lidocaine. An ultrasound image was saved for documentation purposes. The  micropuncture sheath was exchanged for a 5 [place] vascular sheath over a Bentson wire. A closure arteriogram was performed through the side of the sheath confirming access within the right common femoral artery.  Over a Bentson wire, a Mickelson catheter was advanced to the level of the thoracic aorta where it was back bled and flushed. The catheter was then utilized to select the celiac artery. A celiac arteriogram was performed.  With the use of a synchro 14 micro catheter, a regular Renegade micro catheter was advanced through the gastroduodenal artery  into the extravasated duodenal ulcer. Limited contrast injection confirmed appropriate positioning and the GDA was back coiled to near its origin.  Post embolization arteriogram demonstrated a hypertrophied pancreaticoduodenal branch which appeared to provide collateral supply to the area of extravasation within the duodenum. As such, the micro catheter was advanced into the distal aspect of the pancreaticoduodenal artery. Contrast injection confirmed appropriate positioning and the vessel was back coiled to near its origin.  The microcatheter was retracted into the proper hepatic artery and a proper hepatic arteriogram was performed. The micro catheter was removed and a completion celiac arteriogram was performed via the [facility] catheter. Images were reviewed and the procedure was terminated.  All wires, catheters and sheaths were removed from the patient. Hemostasis was achieved at the right groin access site with deployment of an Exoseal closure device. A dressing was placed. The patient tolerated the procedure well without immediate postprocedural complication and was noted to be hemodynamically stable at the time of procedure completion.  FINDINGS: Celiac arteriogram demonstrates frank extravasation from a branch arising from the mid aspect of the GDA. All branches of the celiac artery appear diffusely spasmodic compatible with hypovolemic state.  With the use of a a micro wire, the micro catheter was advanced into the area of active extravasation and the GDA was subsequently back coiled to near its origin.  Post GDA arteriogram demonstrates potential persistent ill-defined supply to the descending duodenum via hypertrophied blanch of the pancreaticoduodenal artery. Note was also made of retrograde supply to the proximal SMA. As such, this branch was cannulated and also back coiled to near its origin.  Completion arteriograms performed via the micro catheter from the common hepatic and via the Mickelson  catheter from the celiac did not demonstrate any persistent supply to the area of active extravasation within the descending segment of the duodenum.  Completion superior mesenteric arteriogram was not performed given patient's renal insufficiency  IMPRESSION: Technically successful percutaneous coil embolization of the GDA and a hypertrophied pancreaticoduodenal artery for active extravasation.   Electronically Signed   By: Sandi Mariscal M.D.   On: 12/09/2014 17:27   Ir Angiogram Follow Up Study  12/09/2014   INDICATION: Acute upper GI bleed from duodenal ulcer. Patient is currently hemodynamically unstable.  Note, patient has history of chronic renal insufficiency (preprocedural creatinine -1.2), however the patient was deemed not a surgical candidate given her advanced age. Given her multiple medical comorbidities and unstable state, the decision was made to proceed with potential life saving mesenteric arteriogram and potential percutaneous embolization.  EXAM: 1. ULTRASOUND GUIDANCE FOR ARTERIAL ACCESS 2. CELIAC ARTERIOGRAM 3. GASTRODUODENAL ARTERIOGRAM AND PERCUTANEOUS COIL EMBOLIZATION 4. PANCREATICODUODENAL ARTERIOGRAM AND PERCUTANEOUS COIL EMBOLIZATION  COMPARISON:  NONE  MEDICATIONS: Patient continuing monitored and supported by the anesthesia service.  CONTRAST:  45 mL OMNIPAQUE IOHEXOL 300 MG/ML  SOLN  FLUOROSCOPY TIME:  11 minutes.  12 seconds (7,654 mGy)  COMPLICATIONS: None immediate  TECHNIQUE: Informed written consent  was obtained from the patient's son after a discussion of the risks, benefits and alternatives to treatment. Questions regarding the procedure were encouraged and answered. A timeout was performed prior to the initiation of the procedure.  The right groin was prepped and draped in the usual sterile fashion, and a sterile drape was applied covering the operative field. Maximum barrier sterile technique with sterile gowns and gloves were used for the procedure. A timeout was performed  prior to the initiation of the procedure. Local anesthesia was provided with 1% lidocaine.  The right femoral head was marked fluoroscopically. Under ultrasound guidance, the right common femoral artery was accessed with a micropuncture kit after the overlying soft tissues were anesthetized with 1% lidocaine. An ultrasound image was saved for documentation purposes. The micropuncture sheath was exchanged for a 5 [place] vascular sheath over a Bentson wire. A closure arteriogram was performed through the side of the sheath confirming access within the right common femoral artery.  Over a Bentson wire, a Mickelson catheter was advanced to the level of the thoracic aorta where it was back bled and flushed. The catheter was then utilized to select the celiac artery. A celiac arteriogram was performed.  With the use of a synchro 14 micro catheter, a regular Renegade micro catheter was advanced through the gastroduodenal artery into the extravasated duodenal ulcer. Limited contrast injection confirmed appropriate positioning and the GDA was back coiled to near its origin.  Post embolization arteriogram demonstrated a hypertrophied pancreaticoduodenal branch which appeared to provide collateral supply to the area of extravasation within the duodenum. As such, the micro catheter was advanced into the distal aspect of the pancreaticoduodenal artery. Contrast injection confirmed appropriate positioning and the vessel was back coiled to near its origin.  The microcatheter was retracted into the proper hepatic artery and a proper hepatic arteriogram was performed. The micro catheter was removed and a completion celiac arteriogram was performed via the [facility] catheter. Images were reviewed and the procedure was terminated.  All wires, catheters and sheaths were removed from the patient. Hemostasis was achieved at the right groin access site with deployment of an Exoseal closure device. A dressing was placed. The patient  tolerated the procedure well without immediate postprocedural complication and was noted to be hemodynamically stable at the time of procedure completion.  FINDINGS: Celiac arteriogram demonstrates frank extravasation from a branch arising from the mid aspect of the GDA. All branches of the celiac artery appear diffusely spasmodic compatible with hypovolemic state.  With the use of a a micro wire, the micro catheter was advanced into the area of active extravasation and the GDA was subsequently back coiled to near its origin.  Post GDA arteriogram demonstrates potential persistent ill-defined supply to the descending duodenum via hypertrophied blanch of the pancreaticoduodenal artery. Note was also made of retrograde supply to the proximal SMA. As such, this branch was cannulated and also back coiled to near its origin.  Completion arteriograms performed via the micro catheter from the common hepatic and via the Mickelson catheter from the celiac did not demonstrate any persistent supply to the area of active extravasation within the descending segment of the duodenum.  Completion superior mesenteric arteriogram was not performed given patient's renal insufficiency  IMPRESSION: Technically successful percutaneous coil embolization of the GDA and a hypertrophied pancreaticoduodenal artery for active extravasation.   Electronically Signed   By: Sandi Mariscal M.D.   On: 12/09/2014 17:27   Ir US Guide Vasc Access Right  12/09/2014  INDICATION: Acute upper GI bleed from duodenal ulcer. Patient is currently hemodynamically unstable.  Note, patient has history of chronic renal insufficiency (preprocedural creatinine -1.2), however the patient was deemed not a surgical candidate given her advanced age. Given her multiple medical comorbidities and unstable state, the decision was made to proceed with potential life saving mesenteric arteriogram and potential percutaneous embolization.  EXAM: 1. ULTRASOUND GUIDANCE FOR  ARTERIAL ACCESS 2. CELIAC ARTERIOGRAM 3. GASTRODUODENAL ARTERIOGRAM AND PERCUTANEOUS COIL EMBOLIZATION 4. PANCREATICODUODENAL ARTERIOGRAM AND PERCUTANEOUS COIL EMBOLIZATION  COMPARISON:  NONE  MEDICATIONS: Patient continuing monitored and supported by the anesthesia service.  CONTRAST:  45 mL OMNIPAQUE IOHEXOL 300 MG/ML  SOLN  FLUOROSCOPY TIME:  11 minutes.  12 seconds (9,937 mGy)  COMPLICATIONS: None immediate  TECHNIQUE: Informed written consent was obtained from the patient's son after a discussion of the risks, benefits and alternatives to treatment. Questions regarding the procedure were encouraged and answered. A timeout was performed prior to the initiation of the procedure.  The right groin was prepped and draped in the usual sterile fashion, and a sterile drape was applied covering the operative field. Maximum barrier sterile technique with sterile gowns and gloves were used for the procedure. A timeout was performed prior to the initiation of the procedure. Local anesthesia was provided with 1% lidocaine.  The right femoral head was marked fluoroscopically. Under ultrasound guidance, the right common femoral artery was accessed with a micropuncture kit after the overlying soft tissues were anesthetized with 1% lidocaine. An ultrasound image was saved for documentation purposes. The micropuncture sheath was exchanged for a 5 [place] vascular sheath over a Bentson wire. A closure arteriogram was performed through the side of the sheath confirming access within the right common femoral artery.  Over a Bentson wire, a Mickelson catheter was advanced to the level of the thoracic aorta where it was back bled and flushed. The catheter was then utilized to select the celiac artery. A celiac arteriogram was performed.  With the use of a synchro 14 micro catheter, a regular Renegade micro catheter was advanced through the gastroduodenal artery into the extravasated duodenal ulcer. Limited contrast injection confirmed  appropriate positioning and the GDA was back coiled to near its origin.  Post embolization arteriogram demonstrated a hypertrophied pancreaticoduodenal branch which appeared to provide collateral supply to the area of extravasation within the duodenum. As such, the micro catheter was advanced into the distal aspect of the pancreaticoduodenal artery. Contrast injection confirmed appropriate positioning and the vessel was back coiled to near its origin.  The microcatheter was retracted into the proper hepatic artery and a proper hepatic arteriogram was performed. The micro catheter was removed and a completion celiac arteriogram was performed via the [facility] catheter. Images were reviewed and the procedure was terminated.  All wires, catheters and sheaths were removed from the patient. Hemostasis was achieved at the right groin access site with deployment of an Exoseal closure device. A dressing was placed. The patient tolerated the procedure well without immediate postprocedural complication and was noted to be hemodynamically stable at the time of procedure completion.  FINDINGS: Celiac arteriogram demonstrates frank extravasation from a branch arising from the mid aspect of the GDA. All branches of the celiac artery appear diffusely spasmodic compatible with hypovolemic state.  With the use of a a micro wire, the micro catheter was advanced into the area of active extravasation and the GDA was subsequently back coiled to near its origin.  Post GDA arteriogram demonstrates potential persistent ill-defined supply  to the descending duodenum via hypertrophied blanch of the pancreaticoduodenal artery. Note was also made of retrograde supply to the proximal SMA. As such, this branch was cannulated and also back coiled to near its origin.  Completion arteriograms performed via the micro catheter from the common hepatic and via the Mickelson catheter from the celiac did not demonstrate any persistent supply to the area  of active extravasation within the descending segment of the duodenum.  Completion superior mesenteric arteriogram was not performed given patient's renal insufficiency  IMPRESSION: Technically successful percutaneous coil embolization of the GDA and a hypertrophied pancreaticoduodenal artery for active extravasation.   Electronically Signed   By: Sandi Mariscal M.D.   On: 12/09/2014 17:27   Dg Chest Port 1 View  12/11/2014   CLINICAL DATA:  Central line inserted, check tip positioned  EXAM: PORTABLE CHEST - 1 VIEW  COMPARISON:  None.  FINDINGS: Normal cardiac silhouette. Right central venous line with tip in the distal SVC. No pneumothorax. Small effusions noted.  IMPRESSION: Right central venous line with tip in distal SVC.  Bilateral small effusions.   Electronically Signed   By: Suzy Bouchard M.D.   On: 12/11/2014 21:51   [place] Guide Roadmapping  12/09/2014   INDICATION: Acute upper GI bleed from duodenal ulcer. Patient is currently hemodynamically unstable.  Note, patient has history of chronic renal insufficiency (preprocedural creatinine -1.2), however the patient was deemed not a surgical candidate given her advanced age. Given her multiple medical comorbidities and unstable state, the decision was made to proceed with potential life saving mesenteric arteriogram and potential percutaneous embolization.  EXAM: 1. ULTRASOUND GUIDANCE FOR ARTERIAL ACCESS 2. CELIAC ARTERIOGRAM 3. GASTRODUODENAL ARTERIOGRAM AND PERCUTANEOUS COIL EMBOLIZATION 4. PANCREATICODUODENAL ARTERIOGRAM AND PERCUTANEOUS COIL EMBOLIZATION  COMPARISON:  NONE  MEDICATIONS: Patient continuing monitored and supported by the anesthesia service.  CONTRAST:  45 mL OMNIPAQUE IOHEXOL 300 MG/ML  SOLN  FLUOROSCOPY TIME:  11 minutes.  12 seconds (2,426 mGy)  COMPLICATIONS: None immediate  TECHNIQUE: Informed written consent was obtained from the patient's son after a discussion of the risks, benefits and  alternatives to treatment. Questions regarding the procedure were encouraged and answered. A timeout was performed prior to the initiation of the procedure.  The right groin was prepped and draped in the usual sterile fashion, and a sterile drape was applied covering the operative field. Maximum barrier sterile technique with sterile gowns and gloves were used for the procedure. A timeout was performed prior to the initiation of the procedure. Local anesthesia was provided with 1% lidocaine.  The right femoral head was marked fluoroscopically. Under ultrasound guidance, the right common femoral artery was accessed with a micropuncture kit after the overlying soft tissues were anesthetized with 1% lidocaine. An ultrasound image was saved for documentation purposes. The micropuncture sheath was exchanged for a 5 [place] vascular sheath over a Bentson wire. A closure arteriogram was performed through the side of the sheath confirming access within the right common femoral artery.  Over a Bentson wire, a Mickelson catheter was advanced to the level of the thoracic aorta where it was back bled and flushed. The catheter was then utilized to select the celiac artery. A celiac arteriogram was performed.  With the use of a synchro 14 micro catheter, a regular Renegade micro catheter was advanced through the gastroduodenal artery into the extravasated duodenal ulcer. Limited contrast injection confirmed appropriate positioning and the GDA was back coiled to near  its origin.  Post embolization arteriogram demonstrated a hypertrophied pancreaticoduodenal branch which appeared to provide collateral supply to the area of extravasation within the duodenum. As such, the micro catheter was advanced into the distal aspect of the pancreaticoduodenal artery. Contrast injection confirmed appropriate positioning and the vessel was back coiled to near its origin.  The microcatheter was retracted into the proper hepatic artery and a proper  hepatic arteriogram was performed. The micro catheter was removed and a completion celiac arteriogram was performed via the [facility] catheter. Images were reviewed and the procedure was terminated.  All wires, catheters and sheaths were removed from the patient. Hemostasis was achieved at the right groin access site with deployment of an Exoseal closure device. A dressing was placed. The patient tolerated the procedure well without immediate postprocedural complication and was noted to be hemodynamically stable at the time of procedure completion.  FINDINGS: Celiac arteriogram demonstrates frank extravasation from a branch arising from the mid aspect of the GDA. All branches of the celiac artery appear diffusely spasmodic compatible with hypovolemic state.  With the use of a a micro wire, the micro catheter was advanced into the area of active extravasation and the GDA was subsequently back coiled to near its origin.  Post GDA arteriogram demonstrates potential persistent ill-defined supply to the descending duodenum via hypertrophied blanch of the pancreaticoduodenal artery. Note was also made of retrograde supply to the proximal SMA. As such, this branch was cannulated and also back coiled to near its origin.  Completion arteriograms performed via the micro catheter from the common hepatic and via the Mickelson catheter from the celiac did not demonstrate any persistent supply to the area of active extravasation within the descending segment of the duodenum.  Completion superior mesenteric arteriogram was not performed given patient's renal insufficiency  IMPRESSION: Technically successful percutaneous coil embolization of the GDA and a hypertrophied pancreaticoduodenal artery for active extravasation.   Electronically Signed   By: Sandi Mariscal M.D.   On: 12/09/2014 17:27    Microbiology: Recent Results (from the past 240 hour(s))  MRSA PCR Screening     Status: None   Collection Time: 12/05/14  8:15 PM   Result Value Ref Range Status   MRSA by PCR NEGATIVE NEGATIVE Final    Comment:        The GeneXpert MRSA Assay (FDA approved for NASAL specimens only), is one component of a comprehensive MRSA colonization surveillance program. It is not intended to diagnose MRSA infection nor to guide or monitor treatment for MRSA infections.   Clostridium Difficile by PCR     Status: None   Collection Time: 12/05/14  9:40 PM  Result Value Ref Range Status   C difficile by pcr NEGATIVE NEGATIVE Final     Labs: Basic Metabolic Panel:  Recent Labs Lab 12/11/14 0519 12/11/14 1245 12/11/14 2012 12/12/14 0437 12/13/14 0547  NA 144 146* 144 145 142  K 4.0 3.9 3.5 3.3* 3.2*  CL 117* 113* 112 112 113*  CO2 _0 GLUCOSE 95 88 89 83 88  BUN 49* 46* 43* 40* 27*  CREATININE 2.22* 2.13* 1.99* 1.88* 1.60*  CALCIUM 6.7* 6.9* 7.0* 7.0* 6.7*   Liver Function Tests: No results for input(s): AST, ALT, ALKPHOS, BILITOT, PROT, ALBUMIN in the last 168 hours. No results for input(s): LIPASE, AMYLASE in the last 168 hours. No results for input(s): AMMONIA in the last 168 hours. CBC:  Recent Labs Lab 12/10/14 1700 12/10/14 2344 12/11/14 1130  12/13/14 0547 12/15/14 0628  WBC 20.8* 19.3* 19.4* 10.5 9.7  HGB 13.2 11.6* 11.4* 9.4* 10.7*  HCT 37.1 33.5* 33.0* 28.4* 31.5*  MCV 81.4 83.5 85.1 87.7 87.5  PLT 51* 60* 74* 105* 184   Cardiac Enzymes: No results for input(s): CKTOTAL, CKMB, CKMBINDEX, TROPONINI in the last 168 hours. BNP: BNP (last 3 results) No results for input(s): BNP in the last 8760 hours.  ProBNP (last 3 results) No results for input(s): PROBNP in the last 8760 hours.  CBG:  Recent Labs Lab 12/09/14 1709  GLUCAP 127*       Signed:  ,  N  [facility] 12/16/2014, 9:55 AM

## 2014-12-15 NOTE — Progress Notes (Signed)
Iv team note.  R IJ cvc removed intact.  Vaseline pressure guaze applied.  Pt. Instructed to maintain supine position for 30 minutes.  No bleeding noted.  Tolerated well.  Staff RN to monitor.

## 2014-12-16 ENCOUNTER — Inpatient Hospital Stay (HOSPITAL_COMMUNITY): Payer: Commercial Managed Care - HMO

## 2014-12-16 NOTE — Progress Notes (Signed)
[facility] PROGRESS NOTE  Sheila GrateLavelle Murphy ZOX:[id number] DOB: Aug 23, 1922 DOA: 12/05/2014 PCP: Alva GarnetSHELTON,KIMBERLY R., MD  Assessment/Plan: 81 y/o female with PMH of HTN, DJD, HPL, Hypothyroidism who [facility] with initial complaints of rectal bleed, melanotic stool and hematemesis. Patient was admitted for upper GI bleed and subsequent evaluation revealed large duodenal ulcer on endoscopy done 12/06/2014. She was treated with PPI therapy. [facility] course was further more complicated due to ongoing blood loss and EGD was repeated 12/09/2014 which showed stomach full of blood but intervention was not possible. She underwent embolization of GDA by interventional radiology on 12/09/2014 area and she was transferred to ICU for stabilization post procedure. Patient has received 8 units of PRBC transfusion, 2 prior to the procedure and 6 during the embolization. Additional issue on admission was acute kidney injury which seems to have improved since the admission.    1. Acute upper GI bleed / acute blood loss anemia secondary to active bleeding from duodenal ulcer / acute GI hemorrhagic shock; Patient presented with hematemesis on the admission. Patient was seen by GI in consultation. Patient underwent duodenal endoscopy on 12/06/2014 with findings of large wad no ulcer. She was started on PPI therapy. Patient then found to have ongoing blood loss and EGD was repeated 12/09/2014 at which time it showed stomach full of blood. Patient underwent embolization of a duodenal artery on 12/09/2014. Patient has received total of 8 units of PRBC transfusion, 2 prior to the procedure and 6 during the embolization. - Per GI recommendations patient will continue Protonix indefinitely on discharge. Continue to monitor CBC, since Hg is still tern down; TF prn; at this time no obvious recurrent bleeding   2. Essential hypertension; Continue Norvasc 5 mg daily 3. Hypothyroidism; Continue Synthroid 25 mcg daily 4.  Urinary tract infection, E.Coli UTI / Leukocytosis; Urine culture grew Escherichia coli; patient completed Cefuroxime 02/21-2/26; afebrile, leukocytosis resolved  5. Acute kidney injury / acute tubular necrosis; Acute kidney injury likely secondary to ATN from hemorrhagic shock - Creatinine improving with IV fluids, 1.99 -> 1.6 in past 48 hours.hold diuretics  6. Hypokalemia Secondary to GI losses. Potassium being supplemented. 7. Protein-calorie malnutrition, severeLikely secondary to acute illness, hemorrhagic shock; will cont Diet as tolerated.    DVT Prophylaxis  - SCDs bilaterally due to risk of bleeding   Code Status: full Family Communication: d/w patient (indicate person spoken with, relationship, and if by phone, the number) Disposition Plan: HHC upon discharge likely 24-48 hrs    Consultants: Gastroenterology Interventional radiology Surgery   Procedures: EGD 2/19 >> large duodenal ulcer  EGD 2/22 >> stomach full of blood, visualization and intervention impossible  Ir Angiogram Visceral Selective 12/09/2014 Technically successful percutaneous coil embolization of the GDA and a hypertrophied pancreaticoduodenal artery for active extravasation.   Ir Angiogram Selective Each Additional Vessel 12/09/2014 Technically successful percutaneous coil embolization of the GDA and a hypertrophied pancreaticoduodenal artery for active extravasation.   Ir Angiogram Selective Each Additional Vessel 12/09/2014 Technically successful percutaneous coil embolization of the GDA and a hypertrophied pancreaticoduodenal artery for active extravasation.   Ir Angiogram Follow Up Study 12/09/2014 Technically successful percutaneous coil embolization of the GDA and a hypertrophied pancreaticoduodenal artery for active extravasation.   Ir [place] Guide Vasc Access Right 12/09/2014 Technically successful percutaneous coil embolization of the GDA and a hypertrophied pancreaticoduodenal artery for  active extravasation.   Dg Chest Port 1 View 12/11/2014 Right central venous line with tip in distal SVC. Bilateral small effusions.   Ir Shana ChuteEmbo  [facility] [employer] mapping 12/09/2014 Technically successful percutaneous coil embolization of the GDA and a hypertrophied pancreaticoduodenal artery for active extravasation.   Antibiotics: Cefuroxime 12/08/2014 --> 12/13/2014  Rocephin 12/05/2014   (indicate start date, and stop date if known)  HPI/Subjective: Alert   Objective: Filed Vitals:   12/16/14 0513  BP: 148/41  Pulse: 77  Temp: 99.2 F (37.3 C)  Resp: 16    Intake/Output Summary (Last 24 hours) at 12/16/14 1034 Last data filed at 12/16/14 0122  Gross per 24 hour  Intake    361 ml  Output      4 ml  Net    357 ml   Filed Weights   12/14/14 0459 12/15/14 0605 12/16/14 0513  Weight: 58.151 kg (128 lb 3.2 oz) 55.248 kg (121 lb 12.8 oz) 54.296 kg (119 lb 11.2 oz)    Exam:   General:  Alert, denies pains   Cardiovascular: s1,s2 rrr  Respiratory: few crackles at bases   Abdomen: soft, nt,nd   Musculoskeletal: no leg edema   Data Reviewed: Basic Metabolic Panel:  Recent Labs Lab 12/11/14 0519 12/11/14 1245 12/11/14 2012 12/12/14 0437 12/13/14 0547  NA 144 146* 144 145 142  K 4.0 3.9 3.5 3.3* 3.2*  CL 117* 113* 112 112 113*  CO2 GLUCOSE 95 88 89 83 88  BUN 49* 46* 43* 40* 27*  CREATININE 2.22* 2.13* 1.99* 1.88* 1.60*  CALCIUM 6.7* 6.9* 7.0* 7.0* 6.7*   Liver Function Tests: No results for input(s): AST, ALT, ALKPHOS, BILITOT, PROT, ALBUMIN in the last 168 hours. No results for input(s): LIPASE, AMYLASE in the last 168 hours. No results for input(s): AMMONIA in the last 168 hours. CBC:  Recent Labs Lab 12/10/14 1700 12/10/14 2344 12/11/14 1130 12/13/14 0547 12/15/14 0628  WBC 20.8* 19.3* 19.4* 10.5 9.7  HGB 13.2 11.6* 11.4* 9.4* 10.7*  HCT 37.1 33.5* 33.0* 28.4* 31.5*  MCV 81.4 83.5 85.1 87.7  87.5  PLT 51* 60* 74* 105* 184   Cardiac Enzymes: No results for input(s): CKTOTAL, CKMB, CKMBINDEX, TROPONINI in the last 168 hours. BNP (last 3 results) No results for input(s): BNP in the last 8760 hours.  ProBNP (last 3 results) No results for input(s): PROBNP in the last 8760 hours.  CBG:  Recent Labs Lab 12/09/14 1709  GLUCAP 127*    No results found for this or any previous visit (from the past 240 hour(s)).   Studies: No results found.  Scheduled Meds: . amLODipine  5 mg Oral Daily  . feeding supplement (ENSURE COMPLETE)  237 mL Oral BID BM  . levothyroxine  25 mcg Oral QAC breakfast  . pantoprazole  40 mg Oral BID AC  . sodium chloride  10 mL Intravenous Q12H  . sodium chloride  3 mL Intravenous Q12H   Continuous Infusions: . sodium chloride 10 mL/hr at 12/09/14 2345    Principal Problem:   GIB (gastrointestinal bleeding) Active Problems:   Essential hypertension   Aortic valve sclerosis   Acute blood loss anemia   Syncope due to orthostatic hypotension   Hypothyroidism   Abnormal urinalysis   Hypokalemia   Protein-calorie malnutrition, severe   Bleeding gastrointestinal    Time spent: >35 minutes     Esperanza Sheets  [facility] Pager [phone]. If 7PM-7AM, please contact night-coverage at [email or web address], password [facility] 12/16/2014, 10:34 AM  LOS: 11 days

## 2014-12-16 NOTE — Progress Notes (Signed)
Physical Therapy Treatment Patient Details Name: Sheila Murphy MRN: [id number] DOB: 24-Sep-1922 Today's Date: 12/16/2014    History of Present Illness 81 y.o. Female with history of hypertension, hypothyroidism, dyslipidemia, known aortic valve sclerosis, asymptomatic right carotid artery disease, and osteoarthritis on regular NSAIDs who was sent to the emergency room with BRBPR. Pt S/P EGD for GI Bleed.    PT Comments    Daughter present for PT session to address her questions/concerns re: discharge home today. Pt mobilizing well (although slowly) and denies dizziness. All questions addressed. Notified Case Manager that pt does NOT have a RW at home and will need one ordered.    Follow Up Recommendations  Home health PT;Supervision/Assistance - 24 hour     Equipment Recommendations  Rolling walker with 5" wheels (family interested in tub seat; recommend HH therapist assess)    Recommendations for Other Services       Precautions / Restrictions Precautions Precautions: Fall    Mobility  Bed Mobility Overal bed mobility: Needs Assistance Bed Mobility: Rolling;Sidelying to Sit;Sit to Sidelying Rolling: Independent Sidelying to sit: Supervision     Sit to sidelying: Supervision General bed mobility comments: HOB 0 and no rail; incr effort and cues to initiate side to sit (pt trying to pull on edge of mattress as she does at home, and unable on hospital mattress); incr time to get feet onto bed, however was able to do it herself  Transfers Overall transfer level: Needs assistance Equipment used: Rolling walker (2 wheeled) Transfers: Sit to/from Stand Sit to Stand: Min guard;Supervision         General transfer comment: No physical assist needed; vc for safe use of RW/hand placement; x 2  Ambulation/Gait Ambulation/Gait assistance: Min guard;Supervision Ambulation Distance (Feet): 15 Feet (15 after toilet) Assistive device: Rolling walker (2 wheeled) Gait  Pattern/deviations: Step-through pattern;Decreased stride length;Trunk flexed     General Gait Details: pt able to manage RW herself; vc for how to manage RW and opening bathroom door; offered to use Rt HHA to simulate use of cane (as she used cane PTA), however pt did not feel strong enough and wanted to use RW   Stairs            Wheelchair Mobility    Modified Rankin (Stroke Patients Only)       Balance Overall balance assessment: Needs assistance Sitting-balance support: No upper extremity supported;Feet supported Sitting balance-Leahy Scale: Good     Standing balance support: No upper extremity supported Standing balance-Leahy Scale: Fair                      Cognition Arousal/Alertness: Awake/alert Behavior During Therapy: WFL for tasks assessed/performed Overall Cognitive Status: Within Functional Limits for tasks assessed                      Exercises      General Comments General comments (skin integrity, edema, etc.): Daughter present and reported pt has nearly 24/7 supervision by her 79 yo son. Pt was independent with all mobility PTA (used cane and rail in hall and bathroom). Daughter concerned re: pt's ability to effectively bathe herself and pt is too private to allow sons to assist her. Pt has been sponge bathing. Daughter interested in tub seat and hand-held shower head.       Pertinent Vitals/Pain Pain Assessment: No/denies pain    Home Living  Home Equipment: Gilmer Mor - single point;Bedside commode;Wheelchair - manual (Pt and daughter state pt does NOT have a RW )      Prior Function            PT Goals (current goals can now be found in the care plan section) Progress towards PT goals: Progressing toward goals    Frequency  Min 3X/week    PT Plan Current plan remains appropriate;Equipment recommendations need to be updated    Co-evaluation             End of Session   Activity Tolerance:  Patient tolerated treatment well Patient left: in bed;with call bell/phone within reach;with bed alarm set;with family/visitor present     Time: 4098-1191 PT Time Calculation (min) (ACUTE ONLY): 30 min  Charges:  $Gait Training: 8-22 mins $Therapeutic Activity: 8-22 mins                    G Codes:      Cannen Dupras 2015-01-06, 2:00 PM Pager [phone]

## 2014-12-16 NOTE — Progress Notes (Signed)
Discussed discharge summary with patient and patients daughter. Reviewed all medications with patients daughter. Patient received walker from [employer] delivered by [employer]. Patient ready for discharge once changing clothes arrives.

## 2014-12-16 NOTE — Care Management (Signed)
PT recommending rolling walker for home . Spoke with patient and daughter Jamesetta Sohyllis at bedside . Explained due to patient's [employer]ace will have to order walker through [employer] [phone] . Phyllis requesting walker be delivered to patient's hospital room before discharge . Spoke with Fayrene FearingJames [phone] with [facility] delivery will be in 3 hours , patient, Jamesetta SoPhyllis and bedside nurse aware.   Order and required information faxed to Fayrene FearingJames at [employer] at [phone] .   Ronny FlurryHeather Gabryel Files RN BSN

## 2014-12-18 DIAGNOSIS — B9620 Unspecified Escherichia coli [E. coli] as the cause of diseases classified elsewhere: Secondary | ICD-10-CM | POA: Diagnosis not present

## 2014-12-18 DIAGNOSIS — N390 Urinary tract infection, site not specified: Secondary | ICD-10-CM | POA: Diagnosis not present

## 2014-12-18 DIAGNOSIS — E039 Hypothyroidism, unspecified: Secondary | ICD-10-CM | POA: Diagnosis not present

## 2014-12-18 DIAGNOSIS — R54 Age-related physical debility: Secondary | ICD-10-CM | POA: Diagnosis not present

## 2014-12-18 DIAGNOSIS — E876 Hypokalemia: Secondary | ICD-10-CM | POA: Diagnosis not present

## 2014-12-18 DIAGNOSIS — I10 Essential (primary) hypertension: Secondary | ICD-10-CM | POA: Diagnosis not present

## 2014-12-18 DIAGNOSIS — D62 Acute posthemorrhagic anemia: Secondary | ICD-10-CM | POA: Diagnosis not present

## 2014-12-18 DIAGNOSIS — E43 Unspecified severe protein-calorie malnutrition: Secondary | ICD-10-CM | POA: Diagnosis not present

## 2014-12-18 DIAGNOSIS — R609 Edema, unspecified: Secondary | ICD-10-CM | POA: Diagnosis not present

## 2014-12-18 DIAGNOSIS — K264 Chronic or unspecified duodenal ulcer with hemorrhage: Secondary | ICD-10-CM | POA: Diagnosis not present

## 2014-12-20 DIAGNOSIS — N390 Urinary tract infection, site not specified: Secondary | ICD-10-CM | POA: Diagnosis not present

## 2014-12-20 DIAGNOSIS — E43 Unspecified severe protein-calorie malnutrition: Secondary | ICD-10-CM | POA: Diagnosis not present

## 2014-12-20 DIAGNOSIS — E039 Hypothyroidism, unspecified: Secondary | ICD-10-CM | POA: Diagnosis not present

## 2014-12-20 DIAGNOSIS — D62 Acute posthemorrhagic anemia: Secondary | ICD-10-CM | POA: Diagnosis not present

## 2014-12-20 DIAGNOSIS — K264 Chronic or unspecified duodenal ulcer with hemorrhage: Secondary | ICD-10-CM | POA: Diagnosis not present

## 2014-12-20 DIAGNOSIS — B9620 Unspecified Escherichia coli [E. coli] as the cause of diseases classified elsewhere: Secondary | ICD-10-CM | POA: Diagnosis not present

## 2014-12-20 DIAGNOSIS — I10 Essential (primary) hypertension: Secondary | ICD-10-CM | POA: Diagnosis not present

## 2014-12-23 DIAGNOSIS — I10 Essential (primary) hypertension: Secondary | ICD-10-CM | POA: Diagnosis not present

## 2014-12-23 DIAGNOSIS — D62 Acute posthemorrhagic anemia: Secondary | ICD-10-CM | POA: Diagnosis not present

## 2014-12-23 DIAGNOSIS — E039 Hypothyroidism, unspecified: Secondary | ICD-10-CM | POA: Diagnosis not present

## 2014-12-23 DIAGNOSIS — K264 Chronic or unspecified duodenal ulcer with hemorrhage: Secondary | ICD-10-CM | POA: Diagnosis not present

## 2014-12-23 DIAGNOSIS — E43 Unspecified severe protein-calorie malnutrition: Secondary | ICD-10-CM | POA: Diagnosis not present

## 2014-12-23 DIAGNOSIS — N390 Urinary tract infection, site not specified: Secondary | ICD-10-CM | POA: Diagnosis not present

## 2014-12-23 DIAGNOSIS — B9620 Unspecified Escherichia coli [E. coli] as the cause of diseases classified elsewhere: Secondary | ICD-10-CM | POA: Diagnosis not present

## 2014-12-24 DIAGNOSIS — E039 Hypothyroidism, unspecified: Secondary | ICD-10-CM | POA: Diagnosis not present

## 2014-12-24 DIAGNOSIS — N390 Urinary tract infection, site not specified: Secondary | ICD-10-CM | POA: Diagnosis not present

## 2014-12-24 DIAGNOSIS — E43 Unspecified severe protein-calorie malnutrition: Secondary | ICD-10-CM | POA: Diagnosis not present

## 2014-12-24 DIAGNOSIS — B9620 Unspecified Escherichia coli [E. coli] as the cause of diseases classified elsewhere: Secondary | ICD-10-CM | POA: Diagnosis not present

## 2014-12-24 DIAGNOSIS — I10 Essential (primary) hypertension: Secondary | ICD-10-CM | POA: Diagnosis not present

## 2014-12-24 DIAGNOSIS — K264 Chronic or unspecified duodenal ulcer with hemorrhage: Secondary | ICD-10-CM | POA: Diagnosis not present

## 2014-12-24 DIAGNOSIS — D62 Acute posthemorrhagic anemia: Secondary | ICD-10-CM | POA: Diagnosis not present

## 2014-12-27 DIAGNOSIS — D62 Acute posthemorrhagic anemia: Secondary | ICD-10-CM | POA: Diagnosis not present

## 2014-12-27 DIAGNOSIS — N390 Urinary tract infection, site not specified: Secondary | ICD-10-CM | POA: Diagnosis not present

## 2014-12-27 DIAGNOSIS — K264 Chronic or unspecified duodenal ulcer with hemorrhage: Secondary | ICD-10-CM | POA: Diagnosis not present

## 2014-12-27 DIAGNOSIS — E039 Hypothyroidism, unspecified: Secondary | ICD-10-CM | POA: Diagnosis not present

## 2014-12-27 DIAGNOSIS — E43 Unspecified severe protein-calorie malnutrition: Secondary | ICD-10-CM | POA: Diagnosis not present

## 2014-12-27 DIAGNOSIS — I10 Essential (primary) hypertension: Secondary | ICD-10-CM | POA: Diagnosis not present

## 2014-12-27 DIAGNOSIS — B9620 Unspecified Escherichia coli [E. coli] as the cause of diseases classified elsewhere: Secondary | ICD-10-CM | POA: Diagnosis not present

## 2014-12-30 DIAGNOSIS — N390 Urinary tract infection, site not specified: Secondary | ICD-10-CM | POA: Diagnosis not present

## 2014-12-30 DIAGNOSIS — D62 Acute posthemorrhagic anemia: Secondary | ICD-10-CM | POA: Diagnosis not present

## 2014-12-30 DIAGNOSIS — B9620 Unspecified Escherichia coli [E. coli] as the cause of diseases classified elsewhere: Secondary | ICD-10-CM | POA: Diagnosis not present

## 2014-12-30 DIAGNOSIS — E039 Hypothyroidism, unspecified: Secondary | ICD-10-CM | POA: Diagnosis not present

## 2014-12-30 DIAGNOSIS — I10 Essential (primary) hypertension: Secondary | ICD-10-CM | POA: Diagnosis not present

## 2014-12-30 DIAGNOSIS — E43 Unspecified severe protein-calorie malnutrition: Secondary | ICD-10-CM | POA: Diagnosis not present

## 2014-12-30 DIAGNOSIS — K264 Chronic or unspecified duodenal ulcer with hemorrhage: Secondary | ICD-10-CM | POA: Diagnosis not present

## 2015-01-01 DIAGNOSIS — B9620 Unspecified Escherichia coli [E. coli] as the cause of diseases classified elsewhere: Secondary | ICD-10-CM | POA: Diagnosis not present

## 2015-01-01 DIAGNOSIS — D62 Acute posthemorrhagic anemia: Secondary | ICD-10-CM | POA: Diagnosis not present

## 2015-01-01 DIAGNOSIS — E039 Hypothyroidism, unspecified: Secondary | ICD-10-CM | POA: Diagnosis not present

## 2015-01-01 DIAGNOSIS — I10 Essential (primary) hypertension: Secondary | ICD-10-CM | POA: Diagnosis not present

## 2015-01-01 DIAGNOSIS — E43 Unspecified severe protein-calorie malnutrition: Secondary | ICD-10-CM | POA: Diagnosis not present

## 2015-01-01 DIAGNOSIS — N390 Urinary tract infection, site not specified: Secondary | ICD-10-CM | POA: Diagnosis not present

## 2015-01-01 DIAGNOSIS — K264 Chronic or unspecified duodenal ulcer with hemorrhage: Secondary | ICD-10-CM | POA: Diagnosis not present

## 2015-01-03 DIAGNOSIS — N390 Urinary tract infection, site not specified: Secondary | ICD-10-CM | POA: Diagnosis not present

## 2015-01-03 DIAGNOSIS — I10 Essential (primary) hypertension: Secondary | ICD-10-CM | POA: Diagnosis not present

## 2015-01-03 DIAGNOSIS — D62 Acute posthemorrhagic anemia: Secondary | ICD-10-CM | POA: Diagnosis not present

## 2015-01-03 DIAGNOSIS — K264 Chronic or unspecified duodenal ulcer with hemorrhage: Secondary | ICD-10-CM | POA: Diagnosis not present

## 2015-01-03 DIAGNOSIS — B9620 Unspecified Escherichia coli [E. coli] as the cause of diseases classified elsewhere: Secondary | ICD-10-CM | POA: Diagnosis not present

## 2015-01-03 DIAGNOSIS — E039 Hypothyroidism, unspecified: Secondary | ICD-10-CM | POA: Diagnosis not present

## 2015-01-03 DIAGNOSIS — E43 Unspecified severe protein-calorie malnutrition: Secondary | ICD-10-CM | POA: Diagnosis not present

## 2015-01-06 DIAGNOSIS — K264 Chronic or unspecified duodenal ulcer with hemorrhage: Secondary | ICD-10-CM | POA: Diagnosis not present

## 2015-01-06 DIAGNOSIS — B9620 Unspecified Escherichia coli [E. coli] as the cause of diseases classified elsewhere: Secondary | ICD-10-CM | POA: Diagnosis not present

## 2015-01-06 DIAGNOSIS — D62 Acute posthemorrhagic anemia: Secondary | ICD-10-CM | POA: Diagnosis not present

## 2015-01-06 DIAGNOSIS — I10 Essential (primary) hypertension: Secondary | ICD-10-CM | POA: Diagnosis not present

## 2015-01-06 DIAGNOSIS — N390 Urinary tract infection, site not specified: Secondary | ICD-10-CM | POA: Diagnosis not present

## 2015-01-06 DIAGNOSIS — E039 Hypothyroidism, unspecified: Secondary | ICD-10-CM | POA: Diagnosis not present

## 2015-01-06 DIAGNOSIS — E43 Unspecified severe protein-calorie malnutrition: Secondary | ICD-10-CM | POA: Diagnosis not present

## 2015-01-09 DIAGNOSIS — D62 Acute posthemorrhagic anemia: Secondary | ICD-10-CM | POA: Diagnosis not present

## 2015-01-09 DIAGNOSIS — N390 Urinary tract infection, site not specified: Secondary | ICD-10-CM | POA: Diagnosis not present

## 2015-01-09 DIAGNOSIS — E43 Unspecified severe protein-calorie malnutrition: Secondary | ICD-10-CM | POA: Diagnosis not present

## 2015-01-09 DIAGNOSIS — I10 Essential (primary) hypertension: Secondary | ICD-10-CM | POA: Diagnosis not present

## 2015-01-09 DIAGNOSIS — E039 Hypothyroidism, unspecified: Secondary | ICD-10-CM | POA: Diagnosis not present

## 2015-01-09 DIAGNOSIS — B9620 Unspecified Escherichia coli [E. coli] as the cause of diseases classified elsewhere: Secondary | ICD-10-CM | POA: Diagnosis not present

## 2015-01-09 DIAGNOSIS — K264 Chronic or unspecified duodenal ulcer with hemorrhage: Secondary | ICD-10-CM | POA: Diagnosis not present

## 2015-01-10 DIAGNOSIS — K264 Chronic or unspecified duodenal ulcer with hemorrhage: Secondary | ICD-10-CM | POA: Diagnosis not present

## 2015-01-10 DIAGNOSIS — E039 Hypothyroidism, unspecified: Secondary | ICD-10-CM | POA: Diagnosis not present

## 2015-01-10 DIAGNOSIS — E43 Unspecified severe protein-calorie malnutrition: Secondary | ICD-10-CM | POA: Diagnosis not present

## 2015-01-10 DIAGNOSIS — D62 Acute posthemorrhagic anemia: Secondary | ICD-10-CM | POA: Diagnosis not present

## 2015-01-10 DIAGNOSIS — B9620 Unspecified Escherichia coli [E. coli] as the cause of diseases classified elsewhere: Secondary | ICD-10-CM | POA: Diagnosis not present

## 2015-01-10 DIAGNOSIS — N390 Urinary tract infection, site not specified: Secondary | ICD-10-CM | POA: Diagnosis not present

## 2015-01-10 DIAGNOSIS — I10 Essential (primary) hypertension: Secondary | ICD-10-CM | POA: Diagnosis not present

## 2015-01-15 DIAGNOSIS — K5900 Constipation, unspecified: Secondary | ICD-10-CM | POA: Diagnosis not present

## 2015-01-15 DIAGNOSIS — B9620 Unspecified Escherichia coli [E. coli] as the cause of diseases classified elsewhere: Secondary | ICD-10-CM | POA: Diagnosis not present

## 2015-01-15 DIAGNOSIS — I129 Hypertensive chronic kidney disease with stage 1 through stage 4 chronic kidney disease, or unspecified chronic kidney disease: Secondary | ICD-10-CM | POA: Diagnosis not present

## 2015-01-15 DIAGNOSIS — N183 Chronic kidney disease, stage 3 (moderate): Secondary | ICD-10-CM | POA: Diagnosis not present

## 2015-01-15 DIAGNOSIS — K264 Chronic or unspecified duodenal ulcer with hemorrhage: Secondary | ICD-10-CM | POA: Diagnosis not present

## 2015-01-15 DIAGNOSIS — E876 Hypokalemia: Secondary | ICD-10-CM | POA: Diagnosis not present

## 2015-01-15 DIAGNOSIS — M859 Disorder of bone density and structure, unspecified: Secondary | ICD-10-CM | POA: Diagnosis not present

## 2015-01-15 DIAGNOSIS — D62 Acute posthemorrhagic anemia: Secondary | ICD-10-CM | POA: Diagnosis not present

## 2015-01-15 DIAGNOSIS — R54 Age-related physical debility: Secondary | ICD-10-CM | POA: Diagnosis not present

## 2015-01-15 DIAGNOSIS — E039 Hypothyroidism, unspecified: Secondary | ICD-10-CM | POA: Diagnosis not present

## 2015-01-15 DIAGNOSIS — R609 Edema, unspecified: Secondary | ICD-10-CM | POA: Diagnosis not present

## 2015-01-15 DIAGNOSIS — I10 Essential (primary) hypertension: Secondary | ICD-10-CM | POA: Diagnosis not present

## 2015-01-15 DIAGNOSIS — N390 Urinary tract infection, site not specified: Secondary | ICD-10-CM | POA: Diagnosis not present

## 2015-01-15 DIAGNOSIS — E43 Unspecified severe protein-calorie malnutrition: Secondary | ICD-10-CM | POA: Diagnosis not present

## 2015-01-16 DIAGNOSIS — N390 Urinary tract infection, site not specified: Secondary | ICD-10-CM | POA: Diagnosis not present

## 2015-01-16 DIAGNOSIS — E43 Unspecified severe protein-calorie malnutrition: Secondary | ICD-10-CM | POA: Diagnosis not present

## 2015-01-16 DIAGNOSIS — K264 Chronic or unspecified duodenal ulcer with hemorrhage: Secondary | ICD-10-CM | POA: Diagnosis not present

## 2015-01-16 DIAGNOSIS — B9620 Unspecified Escherichia coli [E. coli] as the cause of diseases classified elsewhere: Secondary | ICD-10-CM | POA: Diagnosis not present

## 2015-01-16 DIAGNOSIS — D62 Acute posthemorrhagic anemia: Secondary | ICD-10-CM | POA: Diagnosis not present

## 2015-01-16 DIAGNOSIS — I10 Essential (primary) hypertension: Secondary | ICD-10-CM | POA: Diagnosis not present

## 2015-01-16 DIAGNOSIS — E039 Hypothyroidism, unspecified: Secondary | ICD-10-CM | POA: Diagnosis not present

## 2015-01-17 DIAGNOSIS — E039 Hypothyroidism, unspecified: Secondary | ICD-10-CM | POA: Diagnosis not present

## 2015-01-17 DIAGNOSIS — B9620 Unspecified Escherichia coli [E. coli] as the cause of diseases classified elsewhere: Secondary | ICD-10-CM | POA: Diagnosis not present

## 2015-01-17 DIAGNOSIS — D62 Acute posthemorrhagic anemia: Secondary | ICD-10-CM | POA: Diagnosis not present

## 2015-01-17 DIAGNOSIS — E43 Unspecified severe protein-calorie malnutrition: Secondary | ICD-10-CM | POA: Diagnosis not present

## 2015-01-17 DIAGNOSIS — N390 Urinary tract infection, site not specified: Secondary | ICD-10-CM | POA: Diagnosis not present

## 2015-01-17 DIAGNOSIS — K264 Chronic or unspecified duodenal ulcer with hemorrhage: Secondary | ICD-10-CM | POA: Diagnosis not present

## 2015-01-17 DIAGNOSIS — I10 Essential (primary) hypertension: Secondary | ICD-10-CM | POA: Diagnosis not present

## 2015-01-22 DIAGNOSIS — E43 Unspecified severe protein-calorie malnutrition: Secondary | ICD-10-CM | POA: Diagnosis not present

## 2015-01-22 DIAGNOSIS — N390 Urinary tract infection, site not specified: Secondary | ICD-10-CM | POA: Diagnosis not present

## 2015-01-22 DIAGNOSIS — I10 Essential (primary) hypertension: Secondary | ICD-10-CM | POA: Diagnosis not present

## 2015-01-22 DIAGNOSIS — E039 Hypothyroidism, unspecified: Secondary | ICD-10-CM | POA: Diagnosis not present

## 2015-01-22 DIAGNOSIS — B9620 Unspecified Escherichia coli [E. coli] as the cause of diseases classified elsewhere: Secondary | ICD-10-CM | POA: Diagnosis not present

## 2015-01-22 DIAGNOSIS — K264 Chronic or unspecified duodenal ulcer with hemorrhage: Secondary | ICD-10-CM | POA: Diagnosis not present

## 2015-01-22 DIAGNOSIS — D62 Acute posthemorrhagic anemia: Secondary | ICD-10-CM | POA: Diagnosis not present

## 2015-01-24 DIAGNOSIS — K264 Chronic or unspecified duodenal ulcer with hemorrhage: Secondary | ICD-10-CM | POA: Diagnosis not present

## 2015-01-24 DIAGNOSIS — E039 Hypothyroidism, unspecified: Secondary | ICD-10-CM | POA: Diagnosis not present

## 2015-01-24 DIAGNOSIS — D62 Acute posthemorrhagic anemia: Secondary | ICD-10-CM | POA: Diagnosis not present

## 2015-01-24 DIAGNOSIS — I10 Essential (primary) hypertension: Secondary | ICD-10-CM | POA: Diagnosis not present

## 2015-01-24 DIAGNOSIS — B9620 Unspecified Escherichia coli [E. coli] as the cause of diseases classified elsewhere: Secondary | ICD-10-CM | POA: Diagnosis not present

## 2015-01-24 DIAGNOSIS — E43 Unspecified severe protein-calorie malnutrition: Secondary | ICD-10-CM | POA: Diagnosis not present

## 2015-01-24 DIAGNOSIS — N390 Urinary tract infection, site not specified: Secondary | ICD-10-CM | POA: Diagnosis not present

## 2015-01-28 DIAGNOSIS — E039 Hypothyroidism, unspecified: Secondary | ICD-10-CM | POA: Diagnosis not present

## 2015-01-28 DIAGNOSIS — I10 Essential (primary) hypertension: Secondary | ICD-10-CM | POA: Diagnosis not present

## 2015-01-28 DIAGNOSIS — E43 Unspecified severe protein-calorie malnutrition: Secondary | ICD-10-CM | POA: Diagnosis not present

## 2015-01-28 DIAGNOSIS — N390 Urinary tract infection, site not specified: Secondary | ICD-10-CM | POA: Diagnosis not present

## 2015-01-28 DIAGNOSIS — K264 Chronic or unspecified duodenal ulcer with hemorrhage: Secondary | ICD-10-CM | POA: Diagnosis not present

## 2015-01-28 DIAGNOSIS — D62 Acute posthemorrhagic anemia: Secondary | ICD-10-CM | POA: Diagnosis not present

## 2015-01-28 DIAGNOSIS — B9620 Unspecified Escherichia coli [E. coli] as the cause of diseases classified elsewhere: Secondary | ICD-10-CM | POA: Diagnosis not present

## 2015-01-30 ENCOUNTER — Other Ambulatory Visit: Payer: Self-pay | Admitting: *Deleted

## 2015-01-30 ENCOUNTER — Encounter: Payer: Self-pay | Admitting: Vascular Surgery

## 2015-01-30 DIAGNOSIS — R0989 Other specified symptoms and signs involving the circulatory and respiratory systems: Secondary | ICD-10-CM

## 2015-02-03 DIAGNOSIS — N183 Chronic kidney disease, stage 3 (moderate): Secondary | ICD-10-CM | POA: Diagnosis not present

## 2015-02-03 DIAGNOSIS — D649 Anemia, unspecified: Secondary | ICD-10-CM | POA: Diagnosis not present

## 2015-02-03 DIAGNOSIS — I129 Hypertensive chronic kidney disease with stage 1 through stage 4 chronic kidney disease, or unspecified chronic kidney disease: Secondary | ICD-10-CM | POA: Diagnosis not present

## 2015-02-11 DIAGNOSIS — K921 Melena: Secondary | ICD-10-CM | POA: Diagnosis not present

## 2015-02-11 DIAGNOSIS — K269 Duodenal ulcer, unspecified as acute or chronic, without hemorrhage or perforation: Secondary | ICD-10-CM | POA: Diagnosis not present

## 2015-02-12 DIAGNOSIS — N390 Urinary tract infection, site not specified: Secondary | ICD-10-CM | POA: Diagnosis not present

## 2015-02-12 DIAGNOSIS — D62 Acute posthemorrhagic anemia: Secondary | ICD-10-CM | POA: Diagnosis not present

## 2015-02-12 DIAGNOSIS — E039 Hypothyroidism, unspecified: Secondary | ICD-10-CM | POA: Diagnosis not present

## 2015-02-12 DIAGNOSIS — B9620 Unspecified Escherichia coli [E. coli] as the cause of diseases classified elsewhere: Secondary | ICD-10-CM | POA: Diagnosis not present

## 2015-02-12 DIAGNOSIS — K264 Chronic or unspecified duodenal ulcer with hemorrhage: Secondary | ICD-10-CM | POA: Diagnosis not present

## 2015-02-12 DIAGNOSIS — I10 Essential (primary) hypertension: Secondary | ICD-10-CM | POA: Diagnosis not present

## 2015-02-12 DIAGNOSIS — E43 Unspecified severe protein-calorie malnutrition: Secondary | ICD-10-CM | POA: Diagnosis not present

## 2015-03-06 ENCOUNTER — Encounter: Payer: Self-pay | Admitting: Vascular Surgery

## 2015-03-06 ENCOUNTER — Other Ambulatory Visit: Payer: Self-pay | Admitting: Vascular Surgery

## 2015-03-06 DIAGNOSIS — R0989 Other specified symptoms and signs involving the circulatory and respiratory systems: Secondary | ICD-10-CM

## 2015-03-07 ENCOUNTER — Ambulatory Visit (HOSPITAL_COMMUNITY)
Admission: RE | Admit: 2015-03-07 | Discharge: 2015-03-07 | Disposition: A | Discharge status: Home / Self Care | Payer: Commercial Managed Care - HMO | Source: Ambulatory Visit | Attending: Vascular Surgery | Admitting: Vascular Surgery

## 2015-03-07 ENCOUNTER — Ambulatory Visit (INDEPENDENT_AMBULATORY_CARE_PROVIDER_SITE_OTHER): Payer: Commercial Managed Care - HMO | Admitting: Vascular Surgery

## 2015-03-07 ENCOUNTER — Encounter: Payer: Self-pay | Admitting: Vascular Surgery

## 2015-03-07 VITALS — BP 149/47 | HR 70 | Ht 60.0 in | Wt 110.0 lb

## 2015-03-07 DIAGNOSIS — I6523 Occlusion and stenosis of bilateral carotid arteries: Secondary | ICD-10-CM | POA: Insufficient documentation

## 2015-03-07 DIAGNOSIS — R0989 Other specified symptoms and signs involving the circulatory and respiratory systems: Secondary | ICD-10-CM

## 2015-03-07 DIAGNOSIS — I6529 Occlusion and stenosis of unspecified carotid artery: Secondary | ICD-10-CM | POA: Insufficient documentation

## 2015-03-07 NOTE — Progress Notes (Signed)
New Carotid Patient  Referred by:  Andi DevonKimberly Shelton, MD [address] [place] [address]  Reason for referral: B carotid stenosis  History of Present Illness  Sheila Murphy is a 81 y.o. ([birth year]) female who presents with chief complaint: none.  Patient was referred to [facility] for carotid bruit.  The patient has had a prior carotid duplex which demonstrated: RICA 70-90% stenosis, LICA 60-79% stenosis.  Patient has no history of TIA or stroke symptom.  The patient has never had amaurosis fugax or monocular blindness.  The patient has reportedly left facial drooping without hemiplegia.  The patient has never had receptive or expressive aphasia.  The patient's risks factors for carotid disease include: HTN, HLD, and prior smoker.  Past Medical History  Diagnosis Date  . HTN (hypertension)   . Dyslipidemia     On Mevacor - followed by PCP  . Carotid artery disease without cerebral infarction     Right internal carotid: 70-90%, left 60-70%; asymptomatic --> moderately increased RICA velocities on 01/2014 dopplers.  . Aortic valve sclerosis 2004    Echo showed normal LV function, some mild diastolic dysfunction. Aortic sclerosis but no stenosis.  . Glaucoma   . History of heart attack 1960s    No WMA on Echo  . Hiatal hernia   . CAD (coronary artery disease)   . Myocardial infarction     Past Surgical History  Procedure Laterality Date  . Appendectomy    . Hiatal hernia repair    . Transthoracic echocardiogram  2004    LV EF normal, LA mildly dilated, arial septum is aneurysmal, mild aortic sclerosis, mild mitral annular calcification, mild MR, mild TR, RVSP 40-6850mmHg (mod pulm HTN)  . Carotid doppler  2011; 2015    RICA 70-99% diameter reduction, LICA 50-69% diameter reduction, right RCA significantly elevated velocities;; moderately increased when compared to 2011.  Marland Kitchen. Esophagogastroduodenoscopy N/A 12/06/2014    Procedure: ESOPHAGOGASTRODUODENOSCOPY (EGD);   Surgeon: Vertell NovakJames L Edwards Jr., MD;  [facility];  Service: Endoscopy;  Laterality: N/A;  . Radiology with anesthesia N/A 12/09/2014    Procedure: RADIOLOGY WITH ANESTHESIA;  Surgeon: Medication Radiologist, MD;  Location: [facility] OR;  Service: Radiology;  Laterality: N/A;  . Esophagogastroduodenoscopy N/A 12/09/2014    Procedure: ESOPHAGOGASTRODUODENOSCOPY (EGD);  Surgeon: Willis ModenaWilliam Outlaw, MD;  [facility];  Service: Endoscopy;  Laterality: N/A;    History   Social History  . Marital Status: Widowed    Spouse Name: N/A  . Number of Children: 6  . Years of Education: 14   Occupational History  .     Social History Main Topics  . Smoking status: Former Smoker    Quit date: 03/07/1975  . Smokeless tobacco: Not on file  . Alcohol Use: No  . Drug Use: No  . Sexual Activity: Not on file   Other Topics Concern  . Not on file   Social History Narrative   Widowed mother of 5 with one deceased child. She has 3 of her children the spend significant time with her 2 living in her home. 23 grandchildren 18 great grandchildren. 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 As of now the stairs of alcohol house without any problems. 2 - 3 sons that are her caregivers providing her meals and transportation.    Family History  Problem Relation Age of Onset  . Other      Noncontributory, deferred due to age  .  Heart attack Father   . Hyperlipidemia Daughter   . Hypertension Daughter   . Diabetes Son   . Heart disease Son   . Hyperlipidemia Son   . Kidney disease Son   . Heart attack Son     Current Outpatient Prescriptions on File Prior to Visit  Medication Sig Dispense Refill  . acetaminophen (TYLENOL) 650 MG CR tablet Take 650 mg by mouth every 8 (eight) hours as needed for pain.    . cholecalciferol (VITAMIN D) 1000 UNITS tablet Take 1,000 Units by mouth daily.    . Multiple Vitamin (MULTIVITAMIN) tablet Take 1 tablet by mouth  daily.    . pantoprazole (PROTONIX) 40 MG tablet Take 1 tablet (40 mg total) by mouth 2 (two) times daily before a meal. 60 tablet 3  . amLODipine (NORVASC) 10 MG tablet Take 1 tablet (10 mg total) by mouth daily. (Patient not taking: Reported on 12/05/2014) 30 tablet 11  . CALCIUM PO Take by mouth.    . levothyroxine (SYNTHROID, LEVOTHROID) 25 MCG tablet Take 25 mcg by mouth daily before breakfast.    . lovastatin (MEVACOR) 20 MG tablet TAKE ONE TABLET BY MOUTH AT BEDTIME (Patient not taking: Reported on 12/05/2014) 30 tablet 11  . vitamin E 400 UNIT capsule Take 400 Units by mouth daily.     No current facility-administered medications on file prior to visit.    Allergies  Allergen Reactions  . Penicillins     REVIEW OF SYSTEMS:  (Positives checked otherwise negative)  CARDIOVASCULAR:  [x]  chest pain, []  chest pressure, []  palpitations, []  shortness of breath when laying flat, []  shortness of breath with exertion,  []  pain in feet when walking, []  pain in feet when laying flat, []  history of blood clot in veins (DVT), []  history of phlebitis, []  swelling in legs, []  varicose veins  PULMONARY:  []  productive cough, []  asthma, []  wheezing  NEUROLOGIC:  []  weakness in arms or legs, []  numbness in arms or legs, []  difficulty speaking or slurred speech, []  temporary loss of vision in one eye, []  dizziness  HEMATOLOGIC:  [x]  bleeding problems, []  problems with blood clotting too easily  MUSCULOSKEL:  []  joint pain, []  joint swelling  GASTROINTEST:  []  vomiting blood, []  blood in stool     GENITOURINARY:  []  burning with urination, [x]  blood in urine  PSYCHIATRIC:  []  history of major depression  INTEGUMENTARY:  []  rashes, [x]  ulcers  CONSTITUTIONAL:  []  fever, []  chills  For VQI Use Only  PRE-ADM LIVING: Home  AMB STATUS: Ambulatory  CAD Sx: History of MI, but no symptoms No MI within 6 months  PRIOR CHF: None  STRESS TEST: [x]  No, [ ]  Normal, [ ]  + ischemia, [ ]  + MI, [ ]   Both   Physical Examination  Filed Vitals:   03/07/15 0959 03/07/15 1001  BP: 161/48 149/47  Pulse: 70   Height: 5' (1.524 m)   Weight: 110 lb (49.896 kg)   SpO2: 100%    Body mass index is 21.48 kg/(m^2).  General: A&O x 3, WD, elder, thin  Head: [place]/AT, Temporalis wasting,   Ear/Nose/Throat: Hearing grossly intact, nares w/o erythema or drainage, oropharynx w/o Erythema/Exudate, Mallampati score: 2  Eyes: PERRLA, EOMI, arcus senilus  Neck: Supple, no nuchal rigidity, no palpable LAD  Pulmonary: Sym exp, good air movt, CTAB, no rales, rhonchi, & wheezing  Cardiac: RRR, Nl S1, S2, no Murmurs, rubs or gallops  Vascular: Vessel Right Left  Radial Palpable  Palpable  Brachial Palpable Palpable  Carotid Palpable, without bruit Palpable, without bruit  Aorta  Not palpable N/A  Femoral Palpable Palpable  Popliteal Not palpable Not palpable  PT Palpable Not Palpable  DP Not Palpable Palpable   Gastrointestinal: soft, NTND, -G/R, - HSM, - masses, - CVAT B  Musculoskeletal: M/S 5/5 throughout , Extremities without ischemic changes   Neurologic: CN 2-12 intact , Pain and light touch intact in extremities , Motor exam as listed above  Psychiatric: Judgment intact, Mood & affect appropriate for pt's clinical situation  Dermatologic: See M/S exam for extremity exam, no rashes otherwise noted  Lymph : No Cervical, Axillary, or Inguinal lymphadenopathy   Non-Invasive Vascular Imaging  CAROTID DUPLEX (Date: 03/07/2015):   R ICA stenosis: 70-79%  R VA: patent and antegrade  L ICA stenosis: 50-69%  L VA: patent and antegrade  Outside Studies/Documentation 5 pages of outside documents were reviewed including: outpatient PCP charts  Medical Decision Making  Sheila Murphy is a 81 y.o. female who presents with: asx RICA stenosis 70-79%., asx LICA stenosis 60-79%   Patient has no interest in surgical or endovascular therapy.  Most CEA studies also suggest no benefit is  realized unless 3-5 years of longevity is expected.  Consequently, there is no need for surveillance as the patient is not interested in intervention anyway and will manage any sequalae from stroke expectantly.  Thank you for allowing [place] to participate in this patient's care.  Leonides Sake, MD [facility] Office: [phone] Pager: [phone]  03/07/2015, 11:11 AM

## 2015-03-13 ENCOUNTER — Encounter: Payer: Self-pay | Admitting: Nurse Practitioner

## 2015-03-13 DIAGNOSIS — D649 Anemia, unspecified: Secondary | ICD-10-CM | POA: Diagnosis not present

## 2015-03-24 DIAGNOSIS — K264 Chronic or unspecified duodenal ulcer with hemorrhage: Secondary | ICD-10-CM | POA: Diagnosis not present

## 2015-03-24 DIAGNOSIS — M25562 Pain in left knee: Secondary | ICD-10-CM | POA: Diagnosis not present

## 2015-03-24 DIAGNOSIS — E039 Hypothyroidism, unspecified: Secondary | ICD-10-CM | POA: Diagnosis not present

## 2015-03-24 DIAGNOSIS — M25552 Pain in left hip: Secondary | ICD-10-CM | POA: Diagnosis not present

## 2015-03-26 ENCOUNTER — Other Ambulatory Visit: Payer: Self-pay | Admitting: Internal Medicine

## 2015-03-26 ENCOUNTER — Other Ambulatory Visit: Payer: Self-pay | Admitting: Nurse Practitioner

## 2015-03-26 ENCOUNTER — Ambulatory Visit
Admission: RE | Admit: 2015-03-26 | Discharge: 2015-03-26 | Disposition: A | Discharge status: Home / Self Care | Payer: Commercial Managed Care - HMO | Source: Ambulatory Visit | Attending: Internal Medicine | Admitting: Internal Medicine

## 2015-03-26 DIAGNOSIS — M11262 Other chondrocalcinosis, left knee: Secondary | ICD-10-CM | POA: Diagnosis not present

## 2015-03-26 DIAGNOSIS — M1612 Unilateral primary osteoarthritis, left hip: Secondary | ICD-10-CM | POA: Diagnosis not present

## 2015-03-26 DIAGNOSIS — R52 Pain, unspecified: Secondary | ICD-10-CM

## 2015-03-26 DIAGNOSIS — Z87828 Personal history of other (healed) physical injury and trauma: Secondary | ICD-10-CM | POA: Diagnosis not present

## 2015-04-11 ENCOUNTER — Encounter (HOSPITAL_COMMUNITY): Payer: Self-pay

## 2015-04-11 ENCOUNTER — Emergency Department (HOSPITAL_COMMUNITY)
Admission: EM | Admit: 2015-04-11 | Discharge: 2015-04-11 | Disposition: A | Discharge status: Home / Self Care | Payer: Commercial Managed Care - HMO | Attending: Emergency Medicine | Admitting: Emergency Medicine

## 2015-04-11 DIAGNOSIS — Z880 Allergy status to penicillin: Secondary | ICD-10-CM | POA: Insufficient documentation

## 2015-04-11 DIAGNOSIS — I2510 Atherosclerotic heart disease of native coronary artery without angina pectoris: Secondary | ICD-10-CM | POA: Insufficient documentation

## 2015-04-11 DIAGNOSIS — R5383 Other fatigue: Secondary | ICD-10-CM | POA: Diagnosis not present

## 2015-04-11 DIAGNOSIS — I10 Essential (primary) hypertension: Secondary | ICD-10-CM | POA: Diagnosis not present

## 2015-04-11 DIAGNOSIS — D649 Anemia, unspecified: Secondary | ICD-10-CM

## 2015-04-11 DIAGNOSIS — Z87891 Personal history of nicotine dependence: Secondary | ICD-10-CM | POA: Insufficient documentation

## 2015-04-11 DIAGNOSIS — E785 Hyperlipidemia, unspecified: Secondary | ICD-10-CM | POA: Insufficient documentation

## 2015-04-11 DIAGNOSIS — Z8669 Personal history of other diseases of the nervous system and sense organs: Secondary | ICD-10-CM | POA: Diagnosis not present

## 2015-04-11 DIAGNOSIS — Z8719 Personal history of other diseases of the digestive system: Secondary | ICD-10-CM | POA: Insufficient documentation

## 2015-04-11 DIAGNOSIS — Z79899 Other long term (current) drug therapy: Secondary | ICD-10-CM | POA: Diagnosis not present

## 2015-04-11 DIAGNOSIS — R7989 Other specified abnormal findings of blood chemistry: Secondary | ICD-10-CM | POA: Diagnosis present

## 2015-04-11 DIAGNOSIS — I252 Old myocardial infarction: Secondary | ICD-10-CM | POA: Diagnosis not present

## 2015-04-11 LAB — I-STAT CHEM 8, ED
BUN: 36 mg/dL — ABNORMAL HIGH (ref 6–20)
CREATININE: 2.3 mg/dL — AB (ref 0.44–1.00)
Calcium, Ion: 1.21 mmol/L (ref 1.13–1.30)
Chloride: 107 mmol/L (ref 101–111)
Glucose, Bld: 92 mg/dL (ref 65–99)
HEMATOCRIT: 31 % — AB (ref 36.0–46.0)
HEMOGLOBIN: 10.5 g/dL — AB (ref 12.0–15.0)
POTASSIUM: 4.6 mmol/L (ref 3.5–5.1)
Sodium: 141 mmol/L (ref 135–145)
TCO2: 22 mmol/L (ref 0–100)

## 2015-04-11 LAB — BASIC METABOLIC PANEL
ANION GAP: 11 (ref 5–15)
BUN: 38 mg/dL — ABNORMAL HIGH (ref 6–20)
CALCIUM: 9.6 mg/dL (ref 8.9–10.3)
CO2: 23 mmol/L (ref 22–32)
Chloride: 107 mmol/L (ref 101–111)
Creatinine, Ser: 2.03 mg/dL — ABNORMAL HIGH (ref 0.44–1.00)
GFR calc non Af Amer: 20 mL/min — ABNORMAL LOW (ref >60)
GFR, EST AFRICAN AMERICAN: 24 mL/min — AB (ref >60)
Glucose, Bld: 93 mg/dL (ref 65–99)
Potassium: 4.7 mmol/L (ref 3.5–5.1)
Sodium: 141 mmol/L (ref 135–145)

## 2015-04-11 LAB — CBC WITH DIFFERENTIAL/PLATELET
BASOS PCT: 1 % (ref 0–1)
Basophils Absolute: 0 10*3/uL (ref 0.0–0.1)
EOS PCT: 6 % — AB (ref 0–5)
Eosinophils Absolute: 0.4 10*3/uL (ref 0.0–0.7)
HEMATOCRIT: 31.3 % — AB (ref 36.0–46.0)
Hemoglobin: 10.2 g/dL — ABNORMAL LOW (ref 12.0–15.0)
LYMPHS ABS: 1.8 10*3/uL (ref 0.7–4.0)
Lymphocytes Relative: 27 % (ref 12–46)
MCH: 31.2 pg (ref 26.0–34.0)
MCHC: 32.6 g/dL (ref 30.0–36.0)
MCV: 95.7 fL (ref 78.0–100.0)
Monocytes Absolute: 0.4 10*3/uL (ref 0.1–1.0)
Monocytes Relative: 6 % (ref 3–12)
Neutro Abs: 4 10*3/uL (ref 1.7–7.7)
Neutrophils Relative %: 60 % (ref 43–77)
PLATELETS: 286 10*3/uL (ref 150–400)
RBC: 3.27 MIL/uL — AB (ref 3.87–5.11)
RDW: 13.6 % (ref 11.5–15.5)
WBC: 6.6 10*3/uL (ref 4.0–10.5)

## 2015-04-11 LAB — TYPE AND SCREEN
ABO/RH(D): O POS
ANTIBODY SCREEN: NEGATIVE

## 2015-04-11 LAB — PROTIME-INR
INR: 1.13 (ref 0.00–1.49)
Prothrombin Time: 14.7 seconds (ref 11.6–15.2)

## 2015-04-11 LAB — POC OCCULT BLOOD, ED: Fecal Occult Bld: NEGATIVE

## 2015-04-11 NOTE — ED Provider Notes (Signed)
CSN: [id number]     Arrival date & time 04/11/15  1325 History   First MD Initiated Contact with Patient 04/11/15 1501     Chief Complaint  Patient presents with  . Abnormal Lab     (Consider location/radiation/quality/duration/timing/severity/associated sxs/prior Treatment) HPI Comments: Patient sent by request of GI office for concern for worsening anemia and continued GI bleeding.  She had labs drawn on 03/24/2015 and had a hemoglobin of 9.9.  Per daughter.  Patient is continued to have black stools since developing gastric ulcers that bled in February 16.  Patient herself has no acute complaints  The history is provided by the patient. No language interpreter was used.    Past Medical History  Diagnosis Date  . HTN (hypertension)   . Dyslipidemia     On Mevacor - followed by PCP  . Carotid artery disease without cerebral infarction     Right internal carotid: 70-90%, left 60-70%; asymptomatic --> moderately increased RICA velocities on 01/2014 dopplers.  . Aortic valve sclerosis 2004    Echo showed normal LV function, some mild diastolic dysfunction. Aortic sclerosis but no stenosis.  . Glaucoma   . History of heart attack 1960s    No WMA on Echo  . Hiatal hernia   . CAD (coronary artery disease)   . Myocardial infarction    Past Surgical History  Procedure Laterality Date  . Appendectomy    . Hiatal hernia repair    . Transthoracic echocardiogram  2004    LV EF normal, LA mildly dilated, arial septum is aneurysmal, mild aortic sclerosis, mild mitral annular calcification, mild MR, mild TR, RVSP 40-34mmHg (mod pulm HTN)  . Carotid doppler  2011; 2015    RICA 70-99% diameter reduction, LICA 50-69% diameter reduction, right RCA significantly elevated velocities;; moderately increased when compared to 2011.  Marland Kitchen Esophagogastroduodenoscopy N/A 12/06/2014    Procedure: ESOPHAGOGASTRODUODENOSCOPY (EGD);  Surgeon: Vertell Novak., MD;  Location: [facility] ENDOSCOPY;  Service: Endoscopy;   Laterality: N/A;  . Radiology with anesthesia N/A 12/09/2014    Procedure: RADIOLOGY WITH ANESTHESIA;  Surgeon: Medication Radiologist, MD;  Location: [facility] OR;  Service: Radiology;  Laterality: N/A;  . Esophagogastroduodenoscopy N/A 12/09/2014    Procedure: ESOPHAGOGASTRODUODENOSCOPY (EGD);  Surgeon: Willis Modena, MD;  [facility];  Service: Endoscopy;  Laterality: N/A;   Family History  Problem Relation Age of Onset  . Other      Noncontributory, deferred due to age  . Heart attack Father   . Hyperlipidemia Daughter   . Hypertension Daughter   . Diabetes Son   . Heart disease Son   . Hyperlipidemia Son   . Kidney disease Son   . Heart attack Son    History  Substance Use Topics  . Smoking status: Former Smoker    Quit date: 03/07/1975  . Smokeless tobacco: Not on file  . Alcohol Use: No   OB History    No data available     Review of Systems  Constitutional: Negative for fever, chills, diaphoresis, activity change, appetite change and fatigue.  HENT: Negative for congestion, facial swelling, rhinorrhea and sore throat.   Eyes: Negative for photophobia and discharge.  Respiratory: Negative for cough, chest tightness and shortness of breath.   Cardiovascular: Negative for chest pain, palpitations and leg swelling.  Gastrointestinal: Negative for nausea, vomiting, abdominal pain and diarrhea.       Dark stools daily since February 2016  Endocrine: Negative for polydipsia and polyuria.  Genitourinary: Negative for  dysuria, frequency, difficulty urinating and pelvic pain.  Musculoskeletal: Negative for back pain, arthralgias, neck pain and neck stiffness.  Skin: Negative for color change and wound.  Allergic/Immunologic: Negative for immunocompromised state.  Neurological: Negative for facial asymmetry, weakness, numbness and headaches.  Hematological: Does not bruise/bleed easily.  Psychiatric/Behavioral: Negative for confusion and agitation.      Allergies   Penicillins  Home Medications   Prior to Admission medications   Medication Sig Start Date End Date Taking? Authorizing Provider  acetaminophen (TYLENOL) 650 MG CR tablet Take 650 mg by mouth every 8 (eight) hours as needed for pain.    Historical Provider, MD  alendronate (FOSAMAX) 70 MG tablet  12/04/14   Historical Provider, MD  amLODipine (NORVASC) 10 MG tablet Take 1 tablet (10 mg total) by mouth daily. Patient not taking: Reported on 12/05/2014 04/11/13   Marykay Lex, MD  amLODipine (NORVASC) 5 MG tablet  12/04/14   Historical Provider, MD  bumetanide (BUMEX) 0.5 MG tablet Take 1 tablet by mouth daily. 12/19/14   Historical Provider, MD  CALCIUM PO Take by mouth.    Historical Provider, MD  chlorthalidone (HYGROTON) 25 MG tablet Take 1 tablet by mouth daily. 02/28/15   Historical Provider, MD  cholecalciferol (VITAMIN D) 1000 UNITS tablet Take 1,000 Units by mouth daily.    Historical Provider, MD  levothyroxine (SYNTHROID, LEVOTHROID) 25 MCG tablet Take 25 mcg by mouth daily before breakfast.    Historical Provider, MD  lisinopril (PRINIVIL,ZESTRIL) 40 MG tablet Take 40 mg by mouth daily.    Historical Provider, MD  lovastatin (MEVACOR) 20 MG tablet TAKE ONE TABLET BY MOUTH AT BEDTIME Patient not taking: Reported on 12/05/2014 04/26/14   Marykay Lex, MD  Multiple Vitamin (MULTIVITAMIN) tablet Take 1 tablet by mouth daily.    Historical Provider, MD  pantoprazole (PROTONIX) 40 MG tablet Take 1 tablet (40 mg total) by mouth 2 (two) times daily before a meal. 12/15/14   Esperanza Sheets, MD  polyethylene glycol powder (GLYCOLAX/MIRALAX) powder  01/15/15   Historical Provider, MD  potassium chloride (K-DUR) 10 MEQ tablet Take by mouth. Take 2 tablets qam and 1 tablet at night 02/19/15   Historical Provider, MD  sulfamethoxazole-trimethoprim (BACTRIM,SEPTRA) 400-80 MG per tablet  12/19/14   Historical Provider, MD  vitamin E 400 UNIT capsule Take 400 Units by mouth daily.    Historical Provider,  MD   BP 178/96 mmHg  Pulse 69  Temp(Src) 98.2 F (36.8 C) (Oral)  Resp 16  Ht 5' (1.524 m)  Wt 105 lb (47.628 kg)  BMI 20.51 kg/m2  SpO2 99% Physical Exam  Constitutional: She is oriented to person, place, and time. She appears well-developed and well-nourished. No distress.  HENT:  Head: Normocephalic and atraumatic.  Mouth/Throat: No oropharyngeal exudate.  Eyes: Pupils are equal, round, and reactive to light.  Neck: Normal range of motion. Neck supple.  Cardiovascular: Normal rate, regular rhythm and normal heart sounds.  Exam reveals no gallop and no friction rub.   No murmur heard. Pulmonary/Chest: Effort normal and breath sounds normal. No respiratory distress. She has no wheezes. She has no rales.  Abdominal: Soft. Bowel sounds are normal. She exhibits no distension and no mass. There is no tenderness. There is no rebound and no guarding.  Musculoskeletal: Normal range of motion. She exhibits no edema or tenderness.  Neurological: She is alert and oriented to person, place, and time.  Skin: Skin is warm and dry. There is pallor.  Psychiatric: She has a normal mood and affect.    ED Course  Procedures (including critical care time) Labs Review Labs Reviewed  CBC WITH DIFFERENTIAL/PLATELET - Abnormal; Notable for the following:    RBC 3.27 (*)    Hemoglobin 10.2 (*)    HCT 31.3 (*)    Eosinophils Relative 6 (*)    All other components within normal limits  BASIC METABOLIC PANEL - Abnormal; Notable for the following:    BUN 38 (*)    Creatinine, Ser 2.03 (*)    GFR calc non Af Amer 20 (*)    GFR calc Af Amer 24 (*)    All other components within normal limits  I-STAT CHEM 8, ED - Abnormal; Notable for the following:    BUN 36 (*)    Creatinine, Ser 2.30 (*)    Hemoglobin 10.5 (*)    HCT 31.0 (*)    All other components within normal limits  PROTIME-INR  POC OCCULT BLOOD, ED  TYPE AND SCREEN    Imaging Review No results found.   EKG Interpretation None       MDM   Final diagnoses:  Anemia, unspecified anemia type  Other fatigue   Pt is a 81 y.o. female with Pmhx as above who presents with low hemoglobin.  Patient states that she had some labs drawn on 03/24/2015 by her primary doctor and that they were forwarded to her GI doctor, Dr. Dulce Sellar today.  Patient's daughter reports that the office called and the daughter reported that she had continued to have black stools since February and was fatigued and family were told to go to the ED.  Patient herself denies any symptoms including shortness of breath, fatigue, lightheadedness, syncope, chest pain.  On physical exam, vital signs are stable.  Patient appears in no acute distress.  She appears pale but daughter states that her color is at her baseline.  Rectal exam grossly unremarkable.  Point-of-care stool .  Negative.  Hemoglobin 10.2.  She has a chronic nontender periumbilical mass, there is likely an incisional hernia.  There is no overlying discoloration or skin changes.  I feel patient can follow-up closely with her PCP Louis Meckel evaluation in the Emergency Department is complete. It has been determined that no acute conditions requiring further emergency intervention are present at this time. The patient/guardian have been advised of the diagnosis and plan. We have discussed signs and symptoms that warrant return to the ED, such as changes or worsening in symptoms, abdominal pain, fever, frankly bloody stools, chest pain or shortness of breath.       Toy Cookey, MD 04/11/15 [phone]

## 2015-04-11 NOTE — ED Notes (Signed)
signature pad not working  

## 2015-04-11 NOTE — ED Notes (Signed)
Pt has been being followed by GI and was here in February. Has been having dark stools since then. Needs to have her blood checked. Had it drawn on the 6th to check her Hgb. Denies any pain.

## 2015-04-11 NOTE — ED Notes (Addendum)
This RN attempted IV stick x2. Unsuccessful. Jeannett Senior with lab notified and will come draw labs.

## 2015-04-11 NOTE — Discharge Instructions (Signed)
Fatigue Fatigue is a feeling of tiredness, lack of energy, lack of motivation, or feeling tired all the time. Having enough rest, good nutrition, and reducing stress will normally reduce fatigue. Consult your caregiver if it persists. The nature of your fatigue will help your caregiver to find out its cause. The treatment is based on the cause.  CAUSES  There are many causes for fatigue. Most of the time, fatigue can be traced to one or more of your habits or routines. Most causes fit into one or more of three general areas. They are: Lifestyle problems  Sleep disturbances.  Overwork.  Physical exertion.  Unhealthy habits.  Poor eating habits or eating disorders.  Alcohol and/or drug use .  Lack of proper nutrition (malnutrition). Psychological problems  Stress and/or anxiety problems.  Depression.  Grief.  Boredom. Medical Problems or Conditions  Anemia.  Pregnancy.  Thyroid gland problems.  Recovery from major surgery.  Continuous pain.  Emphysema or asthma that is not well controlled  Allergic conditions.  Diabetes.  Infections (such as mononucleosis).  Obesity.  Sleep disorders, such as sleep apnea.  Heart failure or other heart-related problems.  Cancer.  Kidney disease.  Liver disease.  Effects of certain medicines such as antihistamines, cough and cold remedies, prescription pain medicines, heart and blood pressure medicines, drugs used for treatment of cancer, and some antidepressants. SYMPTOMS  The symptoms of fatigue include:   Lack of energy.  Lack of drive (motivation).  Drowsiness.  Feeling of indifference to the surroundings. DIAGNOSIS  The details of how you feel help guide your caregiver in finding out what is causing the fatigue. You will be asked about your present and past health condition. It is important to review all medicines that you take, including prescription and non-prescription items. A thorough exam will be done.  You will be questioned about your feelings, habits, and normal lifestyle. Your caregiver may suggest blood tests, urine tests, or other tests to look for common medical causes of fatigue.  TREATMENT  Fatigue is treated by correcting the underlying cause. For example, if you have continuous pain or depression, treating these causes will improve how you feel. Similarly, adjusting the dose of certain medicines will help in reducing fatigue.  HOME CARE INSTRUCTIONS   Try to get the required amount of good sleep every night.  Eat a healthy and nutritious diet, and drink enough water throughout the day.  Practice ways of relaxing (including yoga or meditation).  Exercise regularly.  Make plans to change situations that cause stress. Act on those plans so that stresses decrease over time. Keep your work and personal routine reasonable.  Avoid street drugs and minimize use of alcohol.  Start taking a daily multivitamin after consulting your caregiver. SEEK MEDICAL CARE IF:   You have persistent tiredness, which cannot be accounted for.  You have fever.  You have unintentional weight loss.  You have headaches.  You have disturbed sleep throughout the night.  You are feeling sad.  You have constipation.  You have dry skin.  You have gained weight.  You are taking any new or different medicines that you suspect are causing fatigue.  You are unable to sleep at night.  You develop any unusual swelling of your legs or other parts of your body. SEEK IMMEDIATE MEDICAL CARE IF:   You are feeling confused.  Your vision is blurred.  You feel faint or pass out.  You develop severe headache.  You develop severe abdominal, pelvic, or  back pain.  You develop chest pain, shortness of breath, or an irregular or fast heartbeat.  You are unable to pass a normal amount of urine.  You develop abnormal bleeding such as bleeding from the rectum or you vomit blood.  You have thoughts  about harming yourself or committing suicide.  You are worried that you might harm someone else. MAKE SURE YOU:   Understand these instructions.  Will watch your condition.  Will get help right away if you are not doing well or get worse. Document Released: 08/01/2007 Document Revised: 12/27/2011 Document Reviewed: 02/05/2014 [facility] Patient Information 2015 [place], [place]. This information is not intended to replace advice given to you by your health care provider. Make sure you discuss any questions you have with your health care provider.    Anemia, Nonspecific Anemia is a condition in which the concentration of red blood cells or hemoglobin in the blood is below normal. Hemoglobin is a substance in red blood cells that carries oxygen to the tissues of the body. Anemia results in not enough oxygen reaching these tissues.  CAUSES  Common causes of anemia include:   Excessive bleeding. Bleeding may be internal or external. This includes excessive bleeding from periods (in women) or from the intestine.   Poor nutrition.   Chronic kidney, thyroid, and liver disease.  Bone marrow disorders that decrease red blood cell production.  Cancer and treatments for cancer.  HIV, AIDS, and their treatments.  Spleen problems that increase red blood cell destruction.  Blood disorders.  Excess destruction of red blood cells due to infection, medicines, and autoimmune disorders. SIGNS AND SYMPTOMS   Minor weakness.   Dizziness.   Headache.  Palpitations.   Shortness of breath, especially with exercise.   Paleness.  Cold sensitivity.  Indigestion.  Nausea.  Difficulty sleeping.  Difficulty concentrating. Symptoms may occur suddenly or they may develop slowly.  DIAGNOSIS  Additional blood tests are often needed. These help your health care provider determine the best treatment. Your health care provider will check your stool for blood and look for other causes of  blood loss.  TREATMENT  Treatment varies depending on the cause of the anemia. Treatment can include:   Supplements of iron, vitamin B12, or folic acid.   Hormone medicines.   A blood transfusion. This may be needed if blood loss is severe.   Hospitalization. This may be needed if there is significant continual blood loss.   Dietary changes.  Spleen removal. HOME CARE INSTRUCTIONS Keep all follow-up appointments. It often takes many weeks to correct anemia, and having your health care provider check on your condition and your response to treatment is very important. SEEK IMMEDIATE MEDICAL CARE IF:   You develop extreme weakness, shortness of breath, or chest pain.   You become dizzy or have trouble concentrating.  You develop heavy vaginal bleeding.   You develop a rash.   You have bloody or black, tarry stools.   You faint.   You vomit up blood.   You vomit repeatedly.   You have abdominal pain.  You have a fever or persistent symptoms for more than 2-3 days.   You have a fever and your symptoms suddenly get worse.   You are dehydrated.  MAKE SURE YOU:  Understand these instructions.  Will watch your condition.  Will get help right away if you are not doing well or get worse. Document Released: 11/11/2004 Document Revised: 06/06/2013 Document Reviewed: 03/30/2013 [facility] Patient Information 2015 [place], [place].  This information is not intended to replace advice given to you by your health care provider. Make sure you discuss any questions you have with your health care provider. ° °

## 2015-04-15 DIAGNOSIS — D649 Anemia, unspecified: Secondary | ICD-10-CM | POA: Diagnosis not present

## 2015-04-15 DIAGNOSIS — I129 Hypertensive chronic kidney disease with stage 1 through stage 4 chronic kidney disease, or unspecified chronic kidney disease: Secondary | ICD-10-CM | POA: Diagnosis not present

## 2015-04-15 DIAGNOSIS — N08 Glomerular disorders in diseases classified elsewhere: Secondary | ICD-10-CM | POA: Diagnosis not present

## 2015-04-15 DIAGNOSIS — N183 Chronic kidney disease, stage 3 (moderate): Secondary | ICD-10-CM | POA: Diagnosis not present

## 2015-04-25 DIAGNOSIS — K269 Duodenal ulcer, unspecified as acute or chronic, without hemorrhage or perforation: Secondary | ICD-10-CM | POA: Diagnosis not present

## 2015-05-07 ENCOUNTER — Other Ambulatory Visit: Payer: Self-pay | Admitting: Cardiology

## 2015-05-08 NOTE — Telephone Encounter (Signed)
Rx(s) sent to pharmacy electronically.  

## 2015-05-19 ENCOUNTER — Telehealth: Payer: Self-pay | Admitting: Cardiology

## 2015-05-19 MED ORDER — CHLORTHALIDONE 25 MG PO TABS: 25.0000 mg | ORAL_TABLET | Freq: Every day | ORAL | Status: DC

## 2015-05-19 NOTE — Telephone Encounter (Signed)
Rx(s) sent to pharmacy electronically. Patient notified. 

## 2015-05-19 NOTE — Telephone Encounter (Signed)
°  1. Which medications need to be refilled? Chlorthalidone    2. Which pharmacy is medication to be sent to?[facility] in [place]   3. Do they need a 30 day or 90 day supply? 30  4. Would they like a call back once the medication has been sent to the pharmacy? Yes

## 2015-06-17 ENCOUNTER — Other Ambulatory Visit: Payer: Self-pay | Admitting: *Deleted

## 2015-06-17 MED ORDER — CHLORTHALIDONE 25 MG PO TABS: 25.0000 mg | ORAL_TABLET | Freq: Every day | ORAL | Status: AC

## 2015-07-01 ENCOUNTER — Ambulatory Visit (INDEPENDENT_AMBULATORY_CARE_PROVIDER_SITE_OTHER): Payer: Commercial Managed Care - HMO | Admitting: Cardiology

## 2015-07-01 ENCOUNTER — Encounter: Payer: Self-pay | Admitting: Cardiology

## 2015-07-01 VITALS — BP 160/62 | HR 70 | Ht 60.0 in | Wt 96.8 lb

## 2015-07-01 DIAGNOSIS — I951 Orthostatic hypotension: Secondary | ICD-10-CM

## 2015-07-01 DIAGNOSIS — I739 Peripheral vascular disease, unspecified: Secondary | ICD-10-CM

## 2015-07-01 DIAGNOSIS — I10 Essential (primary) hypertension: Secondary | ICD-10-CM

## 2015-07-01 DIAGNOSIS — E785 Hyperlipidemia, unspecified: Secondary | ICD-10-CM

## 2015-07-01 DIAGNOSIS — I358 Other nonrheumatic aortic valve disorders: Secondary | ICD-10-CM | POA: Diagnosis not present

## 2015-07-01 DIAGNOSIS — I6523 Occlusion and stenosis of bilateral carotid arteries: Secondary | ICD-10-CM

## 2015-07-01 DIAGNOSIS — K274 Chronic or unspecified peptic ulcer, site unspecified, with hemorrhage: Secondary | ICD-10-CM

## 2015-07-01 DIAGNOSIS — E43 Unspecified severe protein-calorie malnutrition: Secondary | ICD-10-CM

## 2015-07-01 DIAGNOSIS — I779 Disorder of arteries and arterioles, unspecified: Secondary | ICD-10-CM

## 2015-07-01 MED ORDER — AMLODIPINE BESYLATE 10 MG PO TABS: 10.0000 mg | ORAL_TABLET | Freq: Every day | ORAL | Status: AC

## 2015-07-01 NOTE — Patient Instructions (Addendum)
Your physician has requested that you have a ou will receive a reminder letter in the mail two months in advance. If you don't receive a letter, please call our office to schedule the follow-up appointment.

## 2015-07-01 NOTE — Progress Notes (Signed)
PCP: Gwynneth Aliment, MD  Clinic Note: Chief Complaint  Patient presents with  . Annual Exam    no chest pain, SOB or leg swelling  . Hypertension  . Carotid    Stenosis    HPI: Sheila Murphy is a 81 y.o. female with a PMH below who presents today for routine annual  followup of her carotid disease and hypertension. Her last carotid ultrasound was in April of 2015 -- RproICA 70-99%, LICA 50-69% -- represents moderate progression of disease in RICA.Marland Kitchen  JAHANNA RAETHER was last seen in June 2015.  Recent Hospitalizations: Jan-Feb 2016 --> rectal bleed/melena and hematemesis. She had a GI evaluation revealing large duodenal ulcer on endoscopy. This was treated with PPI therapy followed by mobilization of the gastroduodenal artery by IR on the second 16. She received a total of 8 units of PRBC during that procedure 2 preprocedure and 6 following upper normal dosing.  Aspirin and NSAIDs were held, and she did not any further bleeding. Much of this hospitalization was complicated by Escherichia Coli UTI. She was noted to have protein calorie malnutrition on and was hypotensive with borderline hemorrhagic shock. She was resuscitated with volume blood transfusion. She also had electrolyte levels replaced. Consultation: Acute renal insufficiency which is improved.  She states that she doesn't think she is fully recovered yet from this long hospital stay.generally poor appetite during the hospital stay and still had worked at [employer]. She is definitely lost weight.  Unfortunately, she was offered rehabilitation because of this. She had a fall where she landed on her left hip. Since then she's had pain in the hip with movement.  Studies Reviewed:02/2015 -   Carotid dopplers: RpICA > 70% but likely <16%, LIC 10-96%.  ExtCarotid ~>50%  Cr 2.3 in June -- planned referral to nephrology    Interval History: S presents today really without any complaints whatsoever. She just feels tired. She is no  longer taking any NSAIDs admission taking a lot of NSAIDs he does. She still does have some dark stool, and is aware of what the difference of iron supplement related dark stool and blood in her stool.  He has had some dizzy episodes where he feels lightheaded and woozy, mostly with the positions of the specimens and standing.  Cardiovascular ROS: no chest pain or dyspnea on exertion positive for - Fatigue, decreased exercise level. negative for - chest pain, edema, irregular heartbeat, loss of consciousness, murmur, palpitations, paroxysmal nocturnal dyspnea or Not necessarily any significant swelling.   Past Medical History  Diagnosis Date  . HTN (hypertension)   . Dyslipidemia     On Mevacor - followed by PCP  . Carotid artery disease without cerebral infarction     Right internal carotid: 70-90%, left 60-70%; asymptomatic --> moderately increased RICA velocities on 01/2014 dopplers.  . Aortic valve sclerosis 2004    Echo showed normal LV function, some mild diastolic dysfunction. Aortic sclerosis but no stenosis.  . Glaucoma   . History of heart attack 1960s    No WMA on Echo  . Hiatal hernia   . CAD (coronary artery disease)   . Myocardial infarction     Past Surgical History  Procedure Laterality Date  . Appendectomy    . Hiatal hernia repair    . Transthoracic echocardiogram  2004    LV EF normal, LA mildly dilated, arial septum is aneurysmal, mild aortic sclerosis, mild mitral annular calcification, mild MR, mild TR, RVSP 40-21mmHg (mod pulm HTN)  .  Carotid doppler  2011; 2015    RICA 70-99% diameter reduction, LICA 50-69% diameter reduction, right RCA significantly elevated velocities;; moderately increased when compared to 2011.  Marland Kitchen Esophagogastroduodenoscopy N/A 12/06/2014    Procedure: ESOPHAGOGASTRODUODENOSCOPY (EGD);  Surgeon: Vertell Novak., MD;  Location: [facility] ENDOSCOPY;  Service: Endoscopy;  Laterality: N/A;  . Radiology with anesthesia N/A 12/09/2014     Procedure: RADIOLOGY WITH ANESTHESIA;  Surgeon: Medication Radiologist, MD;  Location: [facility] OR;  Service: Radiology;  Laterality: N/A;  . Esophagogastroduodenoscopy N/A 12/09/2014    Procedure: ESOPHAGOGASTRODUODENOSCOPY (EGD);  Surgeon: Willis Modena, MD;  Location: [facility] ENDOSCOPY;  Service: Endoscopy;  Laterality: N/A;   ROS: A comprehensive was performed. Review of Systems  Constitutional: Negative for fever and chills.  HENT: Positive for congestion. Negative for hearing loss.   Eyes: Negative for blurred vision and double vision.  Gastrointestinal: Positive for abdominal pain.       No recent melena or hematochezia.  Genitourinary: Negative for frequency and hematuria.  Musculoskeletal: Positive for back pain and joint pain.  Neurological: Positive for dizziness (Positional /orthostatic) and tremors. Negative for sensory change, speech change, focal weakness and headaches.  Psychiatric/Behavioral: Positive for memory loss. Negative for depression.    Prior to Admission medications   Medication Sig Start Date End Date Taking? Authorizing Provider  acetaminophen (TYLENOL) 650 MG CR tablet Take 650 mg by mouth every 8 (eight) hours as needed for pain.   Yes Historical Provider, MD  CALCIUM PO Take by mouth.   Yes Historical Provider, MD  chlorthalidone (HYGROTON) 25 MG tablet Take 1 tablet (25 mg total) by mouth daily. 06/17/15  Yes Marykay Lex, MD  cholecalciferol (VITAMIN D) 1000 UNITS tablet Take 1,000 Units by mouth daily.   Yes Historical Provider, MD  levothyroxine (SYNTHROID, LEVOTHROID) 25 MCG tablet Take 25 mcg by mouth daily before breakfast.   Yes Historical Provider, MD  lovastatin (MEVACOR) 20 MG tablet Take 20 mg by mouth at bedtime.   Yes Historical Provider, MD  Multiple Vitamin (MULTIVITAMIN) tablet Take 1 tablet by mouth daily.   Yes Historical Provider, MD  polyethylene glycol powder (GLYCOLAX/MIRALAX) powder  01/15/15  Yes Historical Provider, MD  vitamin E 400 UNIT  capsule Take 400 Units by mouth daily.   Yes Historical Provider, MD  alendronate (FOSAMAX) 70 MG tablet  12/04/14   Historical Provider, MD  amLODipine (NORVASC) 10 MG tablet Take 1 tablet (10 mg total) by mouth daily. 07/01/15   Marykay Lex, MD  bumetanide (BUMEX) 0.5 MG tablet Take 0.5 mg by mouth daily.    Historical Provider, MD  lisinopril (PRINIVIL,ZESTRIL) 40 MG tablet Take 40 mg by mouth daily.    Historical Provider, MD  pantoprazole (PROTONIX) 40 MG tablet Take 1 tablet (40 mg total) by mouth 2 (two) times daily before a meal. Patient not taking: Reported on 07/01/2015 12/15/14   Esperanza Sheets, MD  potassium chloride (K-DUR) 10 MEQ tablet Take 10 mEq by mouth 2 (two) times daily. Take 2 tablet in the AM and 1 tablet in the PM    Historical Provider, MD  sulfamethoxazole-trimethoprim (BACTRIM,SEPTRA) 400-80 MG per tablet  12/19/14   Historical Provider, MD   Allergies  Allergen Reactions  . Penicillins      Social History   Social History  . Marital Status: Widowed    Spouse Name: N/A  . Number of Children: 6  . Years of Education: 14   Occupational History  .  Social History Main Topics  . Smoking status: Former Smoker    Quit date: 03/07/1975  . Smokeless tobacco: None  . Alcohol Use: No  . Drug Use: No  . Sexual Activity: Not Asked   Other Topics Concern  . None   Social History Narrative   Widowed mother of 5 with one deceased child. She has 3 of her children the spend significant time with her 2 living in her home. 23 grandchildren 18 great grandchildren. 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 As of now the stairs of alcohol house without any problems. 2 - 3 sons that are her caregivers providing her meals and transportation.   Family History  Problem Relation Age of Onset  . Other      Noncontributory, deferred due to age  . Heart attack Father   . Hyperlipidemia Daughter   . Hypertension Daughter   .  Diabetes Son   . Heart disease Son   . Hyperlipidemia Son   . Kidney disease Son   . Heart attack Son      Wt Readings from Last 3 Encounters:  07/01/15 96 lb 12.8 oz (43.908 kg)  04/11/15 105 lb (47.628 kg)  03/07/15 110 lb (49.896 kg)    PHYSICAL EXAM BP 160/62 mmHg  Pulse 70  Ht 5' (1.524 m)  Wt 96 lb 12.8 oz (43.908 kg)  BMI 18.90 kg/m2 General appearance: alert, cooperative, appears stated age, no distress; frail. She is almost 10 pounds lower than what she was last visit. HEENT: Normocephalic, atraumatic. EOMI, with the exception of some arcus senilis.  Neck is supple, no lymphadenopathy, thyromegaly or JVD. She has bilateral carotid bruits, R >L, but normal carotid pulsation.  Lungs: CTAB, normal respiratory effort,good air movement. No W/R/R.  Heart: RRR, normal S1, S2 with a 2/6 SEM at heard at the right upper sternal border radiating to the carotids. Soft 1-2/6 HSM at the apex, nondisplaced PMI, no heaves or thrills.  Abdomen is soft, nontender, nondistended. Normoactive bowel sounds, no hepatosplenomegaly. Extremities: No clubbing, cyanosis, or edema, 2+ and equal pulses throughout. Neuro: Grossly intact.   Adult ECG Report - not checked  Other studies Reviewed: Additional studies/ records that were reviewed today include:  Recent Labs:   Lab Results  Component Value Date   CREATININE 2.30* 04/11/2015   Lab Results  Component Value Date   HGB 10.5* 04/11/2015     ASSESSMENT / PLAN: Problem List Items Addressed This Visit    Aortic valve sclerosis (Chronic)    NO concerns -- not symptomatically significant stenotis to consider invaseive Rx without symptoms.      Relevant Medications   lovastatin (MEVACOR) 20 MG tablet   bumetanide (BUMEX) 0.5 MG tablet   amLODipine (NORVASC) 10 MG tablet   Other Relevant Orders   VAS US CAROTID   Bleeding gastrointestinal    Relatively stable hemoglobin.  No further melena.      Carotid artery disease without  cerebral infarction (Chronic)    Significant right carotid disease, as yet, not yetat this stage to consider intervention. -- mostly due to the lack of symptoms.  Follow-up annual dopplers in 1 yr. Mild permissive HTN, statin, -- off asa due to GIB.      Relevant Medications   lovastatin (MEVACOR) 20 MG tablet   bumetanide (BUMEX) 0.5 MG tablet   amLODipine (NORVASC) 10 MG tablet   Carotid stenosis - Primary   Relevant Medications  lovastatin (MEVACOR) 20 MG tablet   bumetanide (BUMEX) 0.5 MG tablet   amLODipine (NORVASC) 10 MG tablet   Other Relevant Orders   VAS US CAROTID   Dyslipidemia (Chronic)    On Mevacor. Follow up PCP.      Relevant Medications   lovastatin (MEVACOR) 20 MG tablet   amLODipine (NORVASC) 10 MG tablet   Other Relevant Orders   VAS US CAROTID   Essential hypertension (Chronic)    Blood pressure is little higher than usual. I think she is no longer taking lisinopril. Probably because of concerns for renal disease. Plan will be to increase Norvasc to 10 mg daily. Continue chlorthalidone with no lisinopril. Also to use Bumex estimated. Avoid that would cause orthostatic hypotension.      Relevant Medications   lovastatin (MEVACOR) 20 MG tablet   bumetanide (BUMEX) 0.5 MG tablet   amLODipine (NORVASC) 10 MG tablet   Other Relevant Orders   VAS US CAROTID   Protein-calorie malnutrition, severe    1 week after her hospital stay. Please work on her protein nutrition levels to increase her overall protein levels which would diminish edema.      Syncope due to orthostatic hypotension    Based on this history, I am reluctant to push her blood pressure any higher. I think 160 systolic to as high as tolerated.   I prefer for her blood pressures to be above 130 mmHg  Despite this she is quite hypertensive. I will increase Norvasc to 10 mg.      Relevant Medications   lovastatin (MEVACOR) 20 MG tablet   bumetanide (BUMEX) 0.5 MG tablet   amLODipine  (NORVASC) 10 MG tablet      Current medicines are reviewed at length with the patient today. (+/- concerns) Can  any of her medications. The following changes have been made: She is not on ACE-I.  Using Bumex mostly when necessary Studies Ordered:   No orders of the defined types were placed in this encounter.      Marykay Lex, M.D., M.S. Interventional Cardiologist   Pager # [phone]

## 2015-07-03 ENCOUNTER — Encounter: Payer: Self-pay | Admitting: Cardiology

## 2015-07-03 NOTE — Assessment & Plan Note (Signed)
1 week after her hospital stay. Please work on her protein nutrition levels to increase her overall protein levels which would diminish edema.

## 2015-07-03 NOTE — Assessment & Plan Note (Addendum)
Significant right carotid disease, as yet, not yetat this stage to consider intervention. -- mostly due to the lack of symptoms.  Follow-up annual dopplers in 1 yr. Mild permissive HTN, statin, -- off asa due to GIB.

## 2015-07-03 NOTE — Assessment & Plan Note (Addendum)
Based on this history, I am reluctant to push her blood pressure any higher. I think 160 systolic to as high as tolerated.   I prefer for her blood pressures to be above 130 mmHg  Despite this she is quite hypertensive. I will increase Norvasc to 10 mg.

## 2015-07-03 NOTE — Assessment & Plan Note (Signed)
Blood pressure is little higher than usual. I think she is no longer taking lisinopril. Probably because of concerns for renal disease. Plan will be to increase Norvasc to 10 mg daily. Continue chlorthalidone with no lisinopril. Also to use Bumex estimated. Avoid that would cause orthostatic hypotension.

## 2015-07-03 NOTE — Assessment & Plan Note (Signed)
On Mevacor. Follow up PCP.

## 2015-07-03 NOTE — Assessment & Plan Note (Signed)
Relatively stable hemoglobin.  No further melena.

## 2015-07-03 NOTE — Assessment & Plan Note (Signed)
NO concerns -- not symptomatically significant stenotis to consider invaseive Rx without symptoms.

## 2015-07-14 DIAGNOSIS — N183 Chronic kidney disease, stage 3 (moderate): Secondary | ICD-10-CM | POA: Diagnosis not present

## 2015-07-14 DIAGNOSIS — Z1389 Encounter for screening for other disorder: Secondary | ICD-10-CM | POA: Diagnosis not present

## 2015-07-14 DIAGNOSIS — E039 Hypothyroidism, unspecified: Secondary | ICD-10-CM | POA: Diagnosis not present

## 2015-07-14 DIAGNOSIS — M25552 Pain in left hip: Secondary | ICD-10-CM | POA: Diagnosis not present

## 2015-07-14 DIAGNOSIS — I129 Hypertensive chronic kidney disease with stage 1 through stage 4 chronic kidney disease, or unspecified chronic kidney disease: Secondary | ICD-10-CM | POA: Diagnosis not present

## 2015-08-27 DIAGNOSIS — R609 Edema, unspecified: Secondary | ICD-10-CM | POA: Diagnosis not present

## 2015-08-27 DIAGNOSIS — K921 Melena: Secondary | ICD-10-CM | POA: Diagnosis not present

## 2015-09-30 DIAGNOSIS — M1612 Unilateral primary osteoarthritis, left hip: Secondary | ICD-10-CM | POA: Diagnosis not present

## 2015-10-02 DIAGNOSIS — M25552 Pain in left hip: Secondary | ICD-10-CM | POA: Diagnosis not present

## 2015-10-02 DIAGNOSIS — I129 Hypertensive chronic kidney disease with stage 1 through stage 4 chronic kidney disease, or unspecified chronic kidney disease: Secondary | ICD-10-CM | POA: Diagnosis not present

## 2015-10-02 DIAGNOSIS — E039 Hypothyroidism, unspecified: Secondary | ICD-10-CM | POA: Diagnosis not present

## 2015-10-02 DIAGNOSIS — N183 Chronic kidney disease, stage 3 (moderate): Secondary | ICD-10-CM | POA: Diagnosis not present

## 2015-11-03 DIAGNOSIS — M1612 Unilateral primary osteoarthritis, left hip: Secondary | ICD-10-CM | POA: Diagnosis not present

## 2015-11-11 DIAGNOSIS — M1612 Unilateral primary osteoarthritis, left hip: Secondary | ICD-10-CM | POA: Diagnosis not present

## 2015-11-17 DIAGNOSIS — E039 Hypothyroidism, unspecified: Secondary | ICD-10-CM | POA: Diagnosis not present

## 2015-11-20 DIAGNOSIS — R809 Proteinuria, unspecified: Secondary | ICD-10-CM | POA: Diagnosis not present

## 2015-11-20 DIAGNOSIS — N189 Chronic kidney disease, unspecified: Secondary | ICD-10-CM | POA: Diagnosis not present

## 2015-11-20 DIAGNOSIS — R600 Localized edema: Secondary | ICD-10-CM | POA: Diagnosis not present

## 2015-11-20 DIAGNOSIS — I10 Essential (primary) hypertension: Secondary | ICD-10-CM | POA: Diagnosis not present

## 2015-12-19 DIAGNOSIS — R809 Proteinuria, unspecified: Secondary | ICD-10-CM | POA: Diagnosis not present

## 2015-12-19 DIAGNOSIS — I10 Essential (primary) hypertension: Secondary | ICD-10-CM | POA: Diagnosis not present

## 2015-12-19 DIAGNOSIS — R600 Localized edema: Secondary | ICD-10-CM | POA: Diagnosis not present

## 2015-12-19 DIAGNOSIS — N189 Chronic kidney disease, unspecified: Secondary | ICD-10-CM | POA: Diagnosis not present

## 2016-01-01 DIAGNOSIS — M25552 Pain in left hip: Secondary | ICD-10-CM | POA: Diagnosis not present

## 2016-01-01 DIAGNOSIS — R609 Edema, unspecified: Secondary | ICD-10-CM | POA: Diagnosis not present

## 2016-01-01 DIAGNOSIS — M25511 Pain in right shoulder: Secondary | ICD-10-CM | POA: Diagnosis not present

## 2016-02-17 ENCOUNTER — Other Ambulatory Visit: Payer: Self-pay | Admitting: Cardiology

## 2016-02-17 DIAGNOSIS — E785 Hyperlipidemia, unspecified: Secondary | ICD-10-CM

## 2016-02-17 DIAGNOSIS — I10 Essential (primary) hypertension: Secondary | ICD-10-CM

## 2016-02-17 DIAGNOSIS — I6523 Occlusion and stenosis of bilateral carotid arteries: Secondary | ICD-10-CM

## 2016-03-02 ENCOUNTER — Ambulatory Visit (HOSPITAL_COMMUNITY)
Admission: RE | Admit: 2016-03-02 | Discharge: 2016-03-02 | Disposition: A | Discharge status: Home / Self Care | Payer: Commercial Managed Care - HMO | Source: Ambulatory Visit | Attending: Internal Medicine | Admitting: Internal Medicine

## 2016-03-02 DIAGNOSIS — E785 Hyperlipidemia, unspecified: Secondary | ICD-10-CM | POA: Diagnosis not present

## 2016-03-02 DIAGNOSIS — I6523 Occlusion and stenosis of bilateral carotid arteries: Secondary | ICD-10-CM | POA: Insufficient documentation

## 2016-03-02 DIAGNOSIS — I10 Essential (primary) hypertension: Secondary | ICD-10-CM | POA: Insufficient documentation

## 2016-03-02 DIAGNOSIS — Z87891 Personal history of nicotine dependence: Secondary | ICD-10-CM | POA: Insufficient documentation

## 2016-04-01 DIAGNOSIS — E039 Hypothyroidism, unspecified: Secondary | ICD-10-CM | POA: Diagnosis not present

## 2016-04-01 DIAGNOSIS — M25511 Pain in right shoulder: Secondary | ICD-10-CM | POA: Diagnosis not present

## 2016-04-01 DIAGNOSIS — E782 Mixed hyperlipidemia: Secondary | ICD-10-CM | POA: Diagnosis not present

## 2016-04-01 DIAGNOSIS — I129 Hypertensive chronic kidney disease with stage 1 through stage 4 chronic kidney disease, or unspecified chronic kidney disease: Secondary | ICD-10-CM | POA: Diagnosis not present

## 2016-05-04 DIAGNOSIS — M1612 Unilateral primary osteoarthritis, left hip: Secondary | ICD-10-CM | POA: Diagnosis not present

## 2016-06-03 ENCOUNTER — Other Ambulatory Visit: Payer: Self-pay | Admitting: Internal Medicine

## 2016-06-03 ENCOUNTER — Ambulatory Visit
Admission: RE | Admit: 2016-06-03 | Discharge: 2016-06-03 | Disposition: A | Discharge status: Home / Self Care | Payer: Commercial Managed Care - HMO | Source: Ambulatory Visit | Attending: Internal Medicine | Admitting: Internal Medicine

## 2016-06-03 DIAGNOSIS — R1011 Right upper quadrant pain: Secondary | ICD-10-CM | POA: Diagnosis not present

## 2016-06-03 DIAGNOSIS — R1900 Intra-abdominal and pelvic swelling, mass and lump, unspecified site: Secondary | ICD-10-CM

## 2016-06-03 DIAGNOSIS — E039 Hypothyroidism, unspecified: Secondary | ICD-10-CM | POA: Diagnosis not present

## 2016-06-03 DIAGNOSIS — R1901 Right upper quadrant abdominal swelling, mass and lump: Secondary | ICD-10-CM | POA: Diagnosis not present

## 2016-06-04 ENCOUNTER — Emergency Department (HOSPITAL_COMMUNITY): Payer: Commercial Managed Care - HMO

## 2016-06-04 ENCOUNTER — Emergency Department (HOSPITAL_COMMUNITY)
Admission: EM | Admit: 2016-06-04 | Discharge: 2016-06-04 | Disposition: A | Discharge status: Home / Self Care | Payer: Commercial Managed Care - HMO | Attending: Emergency Medicine | Admitting: Emergency Medicine

## 2016-06-04 ENCOUNTER — Encounter (HOSPITAL_COMMUNITY): Payer: Self-pay | Admitting: Emergency Medicine

## 2016-06-04 DIAGNOSIS — I10 Essential (primary) hypertension: Secondary | ICD-10-CM | POA: Diagnosis not present

## 2016-06-04 DIAGNOSIS — R110 Nausea: Secondary | ICD-10-CM | POA: Diagnosis not present

## 2016-06-04 DIAGNOSIS — K439 Ventral hernia without obstruction or gangrene: Secondary | ICD-10-CM | POA: Diagnosis not present

## 2016-06-04 DIAGNOSIS — I252 Old myocardial infarction: Secondary | ICD-10-CM | POA: Diagnosis not present

## 2016-06-04 DIAGNOSIS — E039 Hypothyroidism, unspecified: Secondary | ICD-10-CM | POA: Diagnosis not present

## 2016-06-04 DIAGNOSIS — I2510 Atherosclerotic heart disease of native coronary artery without angina pectoris: Secondary | ICD-10-CM | POA: Insufficient documentation

## 2016-06-04 DIAGNOSIS — R109 Unspecified abdominal pain: Secondary | ICD-10-CM | POA: Diagnosis present

## 2016-06-04 DIAGNOSIS — Z87891 Personal history of nicotine dependence: Secondary | ICD-10-CM | POA: Insufficient documentation

## 2016-06-04 DIAGNOSIS — K432 Incisional hernia without obstruction or gangrene: Secondary | ICD-10-CM | POA: Insufficient documentation

## 2016-06-04 LAB — URINALYSIS, ROUTINE W REFLEX MICROSCOPIC
BILIRUBIN URINE: NEGATIVE
Glucose, UA: NEGATIVE mg/dL
Hgb urine dipstick: NEGATIVE
KETONES UR: NEGATIVE mg/dL
Leukocytes, UA: NEGATIVE
NITRITE: NEGATIVE
PH: 6 (ref 5.0–8.0)
Protein, ur: 100 mg/dL — AB
Specific Gravity, Urine: 1.013 (ref 1.005–1.030)

## 2016-06-04 LAB — CBC
HEMATOCRIT: 32.7 % — AB (ref 36.0–46.0)
HEMOGLOBIN: 10.2 g/dL — AB (ref 12.0–15.0)
MCH: 30.9 pg (ref 26.0–34.0)
MCHC: 31.2 g/dL (ref 30.0–36.0)
MCV: 99.1 fL (ref 78.0–100.0)
Platelets: 300 10*3/uL (ref 150–400)
RBC: 3.3 MIL/uL — ABNORMAL LOW (ref 3.87–5.11)
RDW: 14 % (ref 11.5–15.5)
WBC: 6.6 10*3/uL (ref 4.0–10.5)

## 2016-06-04 LAB — POC OCCULT BLOOD, ED: FECAL OCCULT BLD: NEGATIVE

## 2016-06-04 LAB — COMPREHENSIVE METABOLIC PANEL
ALBUMIN: 3.8 g/dL (ref 3.5–5.0)
ALT: 11 U/L — ABNORMAL LOW (ref 14–54)
AST: 18 U/L (ref 15–41)
Alkaline Phosphatase: 41 U/L (ref 38–126)
Anion gap: 7 (ref 5–15)
BUN: 31 mg/dL — AB (ref 6–20)
CALCIUM: 9.8 mg/dL (ref 8.9–10.3)
CO2: 23 mmol/L (ref 22–32)
Chloride: 109 mmol/L (ref 101–111)
Creatinine, Ser: 1.97 mg/dL — ABNORMAL HIGH (ref 0.44–1.00)
GFR calc Af Amer: 24 mL/min — ABNORMAL LOW (ref >60)
GFR calc non Af Amer: 21 mL/min — ABNORMAL LOW (ref >60)
GLUCOSE: 104 mg/dL — AB (ref 65–99)
Potassium: 5.2 mmol/L — ABNORMAL HIGH (ref 3.5–5.1)
SODIUM: 139 mmol/L (ref 135–145)
TOTAL PROTEIN: 6.5 g/dL (ref 6.5–8.1)
Total Bilirubin: 0.5 mg/dL (ref 0.3–1.2)

## 2016-06-04 LAB — URINE MICROSCOPIC-ADD ON

## 2016-06-04 LAB — I-STAT CG4 LACTIC ACID, ED
Lactic Acid, Venous: 1.1 mmol/L (ref 0.5–1.9)
Lactic Acid, Venous: 1.05 mmol/L (ref 0.5–1.9)

## 2016-06-04 LAB — LIPASE, BLOOD: Lipase: 29 U/L (ref 11–51)

## 2016-06-04 MED ORDER — AMLODIPINE BESYLATE 5 MG PO TABS
10.0000 mg | ORAL_TABLET | Freq: Once | ORAL | Status: AC
  Administered 2016-06-04: 10 mg via ORAL
  Filled 2016-06-04: qty 2

## 2016-06-04 MED ORDER — SODIUM CHLORIDE 0.9 % IV BOLUS (SEPSIS)
500.0000 mL | Freq: Once | INTRAVENOUS | Status: AC
  Administered 2016-06-04: 500 mL via INTRAVENOUS

## 2016-06-04 MED ORDER — ONDANSETRON 4 MG PO TBDP: ORAL_TABLET | ORAL | 0 refills | Status: AC

## 2016-06-04 MED ORDER — SODIUM CHLORIDE 0.9 % IV SOLN
Freq: Once | INTRAVENOUS | Status: AC
  Administered 2016-06-04: 15:00:00 via INTRAVENOUS

## 2016-06-04 MED ORDER — DIATRIZOATE MEGLUMINE & SODIUM 66-10 % PO SOLN
ORAL | Status: AC
  Filled 2016-06-04: qty 30

## 2016-06-04 MED ORDER — POLYMYXIN B-TRIMETHOPRIM 10000-0.1 UNIT/ML-% OP SOLN: 1.0000 [drp] | OPHTHALMIC | 0 refills | Status: AC

## 2016-06-04 NOTE — ED Triage Notes (Addendum)
N/v and abd pain x 1 week  Has had diarrhea  But this is  After laxatives, had a KUB done yesterday  At dr office

## 2016-06-04 NOTE — ED Provider Notes (Signed)
Patient CT scan shows ventral hernia but no obstruction. Patient doing well. Her blood pressure was moderately elevated but she has not taken her 3 blood pressure medicines today. We will go ahead and give her the amlodipine. Patient will be sent home with Zofran and will follow-up with her PCP   Bethann BerkshireJoseph Linzy Laury, MD 06/04/16 1825

## 2016-06-04 NOTE — ED Notes (Signed)
Pt is in stable condition upon d/c and is escorted from ED via wheelchair. 

## 2016-06-04 NOTE — Discharge Instructions (Signed)
Take your bp medicine and follow up with your md next week. °

## 2016-06-04 NOTE — ED Provider Notes (Addendum)
[facility] DEPT Provider Note   CSN: [id number] Arrival date & time: 06/04/16  1228     History   Chief Complaint Chief Complaint  Patient presents with  . Abdominal Pain  . Emesis    HPI Sheila Murphy is a 82 y.o. female.  Patient from home with nausea, vomiting, abdominal pain for the past 2 weeks. States she has been having multiple episodes of dry heaving and vomiting daily for the past 2 weeks. She has had hard bowel movements but still having bowel movements and passing gas. Saw her PCP yesterday who performed an x-ray that was concerning for possible bowel obstruction. Denies any fever or chills. Denies any dysuria hematuria, vaginal bleeding or discharge. Denies chest pain or shortness of breath. Still has appetite has frequent episodes of vomiting. She's noticed a firm mass in her abdomen about the past month that is new.   The history is provided by the patient and a relative.    Past Medical History:  Diagnosis Date  . Aortic valve sclerosis 2004   Echo showed normal LV function, some mild diastolic dysfunction. Aortic sclerosis but no stenosis.  Marland Kitchen. CAD (coronary artery disease)   . Carotid artery disease without cerebral infarction [facility](HCC)    Right internal carotid: 70-90%, left 60-70%; asymptomatic --> moderately increased RICA velocities on 01/2014 dopplers.  . Dyslipidemia    On Mevacor - followed by PCP  . Glaucoma   . Hiatal hernia   . History of heart attack 1960s   No WMA on Echo  . HTN (hypertension)   . Myocardial infarction [facility](HCC)     Patient Active Problem List   Diagnosis Date Noted  . Carotid stenosis 03/07/2015  . Bleeding gastrointestinal   . Protein-calorie malnutrition, severe (HCC) 12/06/2014  . GIB (gastrointestinal bleeding) 12/05/2014  . Acute blood loss anemia 12/05/2014  . Syncope due to orthostatic hypotension 12/05/2014  . Hypothyroidism 12/05/2014  . Abnormal urinalysis 12/05/2014  . Hypokalemia 12/05/2014  . Carotid artery  disease without cerebral infarction (HCC)   . Essential hypertension   . Dyslipidemia   . Aortic valve sclerosis     Past Surgical History:  Procedure Laterality Date  . APPENDECTOMY    . CAROTID DOPPLER  2011; 2015   RICA 70-99% diameter reduction, LICA 50-69% diameter reduction, right RCA significantly elevated velocities;; moderately increased when compared to 2011.  . ESOPHAGOGASTRODUODENOSCOPY N/A 12/06/2014   Procedure: ESOPHAGOGASTRODUODENOSCOPY (EGD);  Surgeon: Vertell NovakJames L Edwards Jr., MD;  [facility];  Service: Endoscopy;  Laterality: N/A;  . ESOPHAGOGASTRODUODENOSCOPY N/A 12/09/2014   Procedure: ESOPHAGOGASTRODUODENOSCOPY (EGD);  Surgeon: Willis ModenaWilliam Outlaw, MD;  [facility];  Service: Endoscopy;  Laterality: N/A;  . HIATAL HERNIA REPAIR    . RADIOLOGY WITH ANESTHESIA N/A 12/09/2014   Procedure: RADIOLOGY WITH ANESTHESIA;  Surgeon: Medication Radiologist, MD;  Location: [facility] OR;  Service: Radiology;  Laterality: N/A;  . TRANSTHORACIC ECHOCARDIOGRAM  2004   LV EF normal, LA mildly dilated, arial septum is aneurysmal, mild aortic sclerosis, mild mitral annular calcification, mild MR, mild TR, RVSP 40-450mmHg (mod pulm HTN)    OB History    No data available       Home Medications    Prior to Admission medications   Medication Sig Start Date End Date Taking? Authorizing Provider  acetaminophen (TYLENOL) 650 MG CR tablet Take 650 mg by mouth every 8 (eight) hours as needed for pain.   Yes Historical Provider, MD  alendronate (FOSAMAX) 70 MG tablet  12/04/14  Yes Historical Provider, MD  amLODipine (NORVASC) 10 MG tablet Take 1 tablet (10 mg total) by mouth daily. 07/01/15  Yes Marykay Lex, MD  chlorthalidone (HYGROTON) 25 MG tablet Take 1 tablet (25 mg total) by mouth daily. 06/17/15  Yes Marykay Lex, MD  cholecalciferol (VITAMIN D) 1000 UNITS tablet Take 1,000 Units by mouth daily.   Yes Historical Provider, MD  furosemide (LASIX) 40 MG tablet Take 40 mg by  mouth daily.   Yes Historical Provider, MD  lisinopril (PRINIVIL,ZESTRIL) 5 MG tablet Take 5 mg by mouth daily.   Yes Historical Provider, MD  lovastatin (MEVACOR) 20 MG tablet Take 20 mg by mouth at bedtime.   Yes Historical Provider, MD  potassium chloride (K-DUR,KLOR-CON) 10 MEQ tablet Take 20 mEq by mouth daily.   Yes Historical Provider, MD  vitamin C (ASCORBIC ACID) 500 MG tablet Take 500 mg by mouth daily.   Yes Historical Provider, MD  bumetanide (BUMEX) 0.5 MG tablet Take 0.5 mg by mouth daily.    Historical Provider, MD  CALCIUM PO Take by mouth.    Historical Provider, MD  levothyroxine (SYNTHROID, LEVOTHROID) 25 MCG tablet Take 50 mcg by mouth daily before breakfast.     Historical Provider, MD  lisinopril (PRINIVIL,ZESTRIL) 40 MG tablet Take 40 mg by mouth daily.    Historical Provider, MD  Multiple Vitamin (MULTIVITAMIN) tablet Take 1 tablet by mouth daily.    Historical Provider, MD  pantoprazole (PROTONIX) 40 MG tablet Take 1 tablet (40 mg total) by mouth 2 (two) times daily before a meal. Patient not taking: Reported on 07/01/2015 12/15/14   Esperanza Sheets, MD  polyethylene glycol powder (GLYCOLAX/MIRALAX) powder  01/15/15   Historical Provider, MD  potassium chloride (K-DUR) 10 MEQ tablet Take 10 mEq by mouth 2 (two) times daily. Take 2 tablet in the AM and 1 tablet in the PM    Historical Provider, MD  sulfamethoxazole-trimethoprim (BACTRIM,SEPTRA) 400-80 MG per tablet  12/19/14   Historical Provider, MD  vitamin E 400 UNIT capsule Take 400 Units by mouth daily.    Historical Provider, MD    Family History Family History  Problem Relation Age of Onset  . Heart attack Father   . Other      Noncontributory, deferred due to age  . Hyperlipidemia Daughter   . Hypertension Daughter   . Diabetes Son   . Heart disease Son   . Hyperlipidemia Son   . Kidney disease Son   . Heart attack Son     Social History Social History  Substance Use Topics  . Smoking status: Former  Smoker    Quit date: 03/07/1975  . Smokeless tobacco: Not on file  . Alcohol use No     Allergies   Penicillins   Review of Systems Review of Systems  Constitutional: Positive for activity change. Negative for appetite change, fatigue and fever.  Respiratory: Negative for cough, chest tightness and shortness of breath.   Cardiovascular: Negative for chest pain.  Gastrointestinal: Positive for abdominal pain, nausea and vomiting. Negative for constipation and diarrhea.  Genitourinary: Negative for dysuria, hematuria, vaginal bleeding and vaginal discharge.  Musculoskeletal: Negative for arthralgias and myalgias.  Neurological: Positive for weakness. Negative for dizziness and headaches.   A complete 10 system review of systems was obtained and all systems are negative except as noted in the HPI and PMH.    Physical Exam Updated Vital Signs BP 167/65 (BP Location: Right Arm)   Pulse 71  Temp 98.9 F (37.2 C) (Oral)   Resp 16   Ht 5' (1.524 m)   Wt 102 lb (46.3 kg)   SpO2 99%   BMI 19.92 kg/m   Physical Exam  Constitutional: She is oriented to person, place, and time. She appears well-developed and well-nourished. No distress.  HENT:  Head: Normocephalic and atraumatic.  Mouth/Throat: Oropharynx is clear and moist. No oropharyngeal exudate.  Eyes: EOM are normal. Pupils are equal, round, and reactive to light.  Purulent drainage L eye  Neck: Normal range of motion. Neck supple.  No meningismus.  Cardiovascular: Normal rate, regular rhythm, normal heart sounds and intact distal pulses.   No murmur heard. Pulmonary/Chest: Effort normal and breath sounds normal. No respiratory distress.  Abdominal: Soft. There is tenderness. There is no rebound and no guarding.  TTP lower abdomen. Firm mass possible ventral hernia to R of scar, nonreducible.  No guarding or rebound  Musculoskeletal: Normal range of motion. She exhibits no edema or tenderness.  No CVAT  Neurological:  She is alert and oriented to person, place, and time. No cranial nerve deficit. She exhibits normal muscle tone. Coordination normal.  No ataxia on finger to nose bilaterally. No pronator drift. 5/5 strength throughout. CN 2-12 intact.Equal grip strength. Sensation intact.   Skin: Skin is warm.  Psychiatric: She has a normal mood and affect. Her behavior is normal.  Nursing note and vitals reviewed.    ED Treatments / Results  Labs (all labs ordered are listed, but only abnormal results are displayed) Labs Reviewed  COMPREHENSIVE METABOLIC PANEL - Abnormal; Notable for the following:       Result Value   Potassium 5.2 (*)    Glucose, Bld 104 (*)    BUN 31 (*)    Creatinine, Ser 1.97 (*)    ALT 11 (*)    GFR calc non Af Amer 21 (*)    GFR calc Af Amer 24 (*)    All other components within normal limits  CBC - Abnormal; Notable for the following:    RBC 3.30 (*)    Hemoglobin 10.2 (*)    HCT 32.7 (*)    All other components within normal limits  LIPASE, BLOOD  URINALYSIS, ROUTINE W REFLEX MICROSCOPIC (NOT AT [facility])    EKG  EKG Interpretation None       Radiology Dg Abd 1 View  Result Date: 06/03/2016 CLINICAL DATA:  Right lower quadrant pain. EXAM: ABDOMEN - 1 VIEW COMPARISON:  12/09/2014 . FINDINGS: Surgical clips in the gastroesophageal junction. Surgical coils noted of the right upper quadrant. Stomach is slightly distended. Mildly prominent loops of small and large bowel are noted. Follow-up abdominal series suggested to exclude developing small bowel obstruction or adynamic ileus. No free air. Degenerative changes scoliosis lumbar spine and both hips. Pelvic calcifications consistent phleboliths. IMPRESSION: 1. Surgical clips gastroesophageal junction. Surgical coils right upper quadrant. The stomach is slightly distended. 2. Slightly prominent air-filled loops of small and large bowel. Mild adynamic ileus or bowel obstruction cannot be entirely excluded. Follow-up  abdominal series suggested to demonstrate resolution. Electronically Signed   By: Maisie Fus  Register   On: 06/03/2016 17:25    Procedures Procedures (including critical care time)  Medications Ordered in ED Medications - No data to display   Initial Impression / Assessment and Plan / ED Course  I have reviewed the triage vital signs and the nursing notes.  Pertinent labs & imaging results that were available during my care of the  patient were reviewed by me and considered in my medical decision making (see chart for details).  Clinical Course  Patient presents with lower abdominal pain and nausea vomiting for the past several weeks. Possible bowel obstruction based on x-ray yesterday.  Abdomen is soft but she has a firm mass to the right upper surgical incision. She states this is been there for about 1 month.  Concern for possible incarcerated ventral hernia and possible bowel obstruction. D/w Dr. Derrell Lollingamirez. Incarcerated ventral hernias likely chronic. Agrees with imaging.  Labs show stable elevated creatinine and mild hyperkalemia. We'll give IV fluids. Will also obtain CT scan to evaluate for obstruction.  Dr. Estell HarpinZammit to assume care at Shift change.  Addendum: CT results d/w Dr. Derrell Lollingamirez.  Multiple ventral hernias containing bowel without obstruction or inflammation. Not reducible on exam, suspected to be chronically so. Dr. Derrell Lollingamirez does not feel any acute intervention needed  Final Clinical Impressions(s) / ED Diagnoses   Final diagnoses:  None    New Prescriptions New Prescriptions   No medications on file     Glynn OctaveStephen Tyquarius Paglia, MD 06/04/16 1558    Glynn OctaveStephen Barnabas Henriques, MD 06/04/16 1911

## 2016-06-04 NOTE — ED Notes (Signed)
Placed patient in a gown and on the monitor and got vitals

## 2016-07-08 DIAGNOSIS — N183 Chronic kidney disease, stage 3 (moderate): Secondary | ICD-10-CM | POA: Diagnosis not present

## 2016-07-08 DIAGNOSIS — I129 Hypertensive chronic kidney disease with stage 1 through stage 4 chronic kidney disease, or unspecified chronic kidney disease: Secondary | ICD-10-CM | POA: Diagnosis not present

## 2016-07-08 DIAGNOSIS — R911 Solitary pulmonary nodule: Secondary | ICD-10-CM | POA: Diagnosis not present

## 2016-07-08 DIAGNOSIS — E039 Hypothyroidism, unspecified: Secondary | ICD-10-CM | POA: Diagnosis not present

## 2016-07-09 DIAGNOSIS — M8580 Other specified disorders of bone density and structure, unspecified site: Secondary | ICD-10-CM | POA: Diagnosis not present

## 2016-07-09 DIAGNOSIS — R2689 Other abnormalities of gait and mobility: Secondary | ICD-10-CM | POA: Diagnosis not present

## 2016-07-09 DIAGNOSIS — M810 Age-related osteoporosis without current pathological fracture: Secondary | ICD-10-CM | POA: Diagnosis not present

## 2016-07-09 DIAGNOSIS — M6281 Muscle weakness (generalized): Secondary | ICD-10-CM | POA: Diagnosis not present

## 2016-07-09 DIAGNOSIS — E559 Vitamin D deficiency, unspecified: Secondary | ICD-10-CM | POA: Diagnosis not present

## 2016-07-09 DIAGNOSIS — E1122 Type 2 diabetes mellitus with diabetic chronic kidney disease: Secondary | ICD-10-CM | POA: Diagnosis not present

## 2016-07-09 DIAGNOSIS — I129 Hypertensive chronic kidney disease with stage 1 through stage 4 chronic kidney disease, or unspecified chronic kidney disease: Secondary | ICD-10-CM | POA: Diagnosis not present

## 2016-07-09 DIAGNOSIS — R911 Solitary pulmonary nodule: Secondary | ICD-10-CM | POA: Diagnosis not present

## 2016-07-09 DIAGNOSIS — N183 Chronic kidney disease, stage 3 (moderate): Secondary | ICD-10-CM | POA: Diagnosis not present

## 2016-07-13 DIAGNOSIS — E559 Vitamin D deficiency, unspecified: Secondary | ICD-10-CM | POA: Diagnosis not present

## 2016-07-13 DIAGNOSIS — I129 Hypertensive chronic kidney disease with stage 1 through stage 4 chronic kidney disease, or unspecified chronic kidney disease: Secondary | ICD-10-CM | POA: Diagnosis not present

## 2016-07-13 DIAGNOSIS — R2689 Other abnormalities of gait and mobility: Secondary | ICD-10-CM | POA: Diagnosis not present

## 2016-07-13 DIAGNOSIS — N183 Chronic kidney disease, stage 3 (moderate): Secondary | ICD-10-CM | POA: Diagnosis not present

## 2016-07-13 DIAGNOSIS — M6281 Muscle weakness (generalized): Secondary | ICD-10-CM | POA: Diagnosis not present

## 2016-07-13 DIAGNOSIS — M8580 Other specified disorders of bone density and structure, unspecified site: Secondary | ICD-10-CM | POA: Diagnosis not present

## 2016-07-13 DIAGNOSIS — R911 Solitary pulmonary nodule: Secondary | ICD-10-CM | POA: Diagnosis not present

## 2016-07-13 DIAGNOSIS — M810 Age-related osteoporosis without current pathological fracture: Secondary | ICD-10-CM | POA: Diagnosis not present

## 2016-07-13 DIAGNOSIS — E1122 Type 2 diabetes mellitus with diabetic chronic kidney disease: Secondary | ICD-10-CM | POA: Diagnosis not present

## 2016-07-15 DIAGNOSIS — I129 Hypertensive chronic kidney disease with stage 1 through stage 4 chronic kidney disease, or unspecified chronic kidney disease: Secondary | ICD-10-CM | POA: Diagnosis not present

## 2016-07-15 DIAGNOSIS — N183 Chronic kidney disease, stage 3 (moderate): Secondary | ICD-10-CM | POA: Diagnosis not present

## 2016-07-15 DIAGNOSIS — M8580 Other specified disorders of bone density and structure, unspecified site: Secondary | ICD-10-CM | POA: Diagnosis not present

## 2016-07-15 DIAGNOSIS — E559 Vitamin D deficiency, unspecified: Secondary | ICD-10-CM | POA: Diagnosis not present

## 2016-07-15 DIAGNOSIS — M6281 Muscle weakness (generalized): Secondary | ICD-10-CM | POA: Diagnosis not present

## 2016-07-15 DIAGNOSIS — R2689 Other abnormalities of gait and mobility: Secondary | ICD-10-CM | POA: Diagnosis not present

## 2016-07-15 DIAGNOSIS — M810 Age-related osteoporosis without current pathological fracture: Secondary | ICD-10-CM | POA: Diagnosis not present

## 2016-07-15 DIAGNOSIS — E1122 Type 2 diabetes mellitus with diabetic chronic kidney disease: Secondary | ICD-10-CM | POA: Diagnosis not present

## 2016-07-15 DIAGNOSIS — R911 Solitary pulmonary nodule: Secondary | ICD-10-CM | POA: Diagnosis not present

## 2016-07-20 DIAGNOSIS — I129 Hypertensive chronic kidney disease with stage 1 through stage 4 chronic kidney disease, or unspecified chronic kidney disease: Secondary | ICD-10-CM | POA: Diagnosis not present

## 2016-07-20 DIAGNOSIS — E559 Vitamin D deficiency, unspecified: Secondary | ICD-10-CM | POA: Diagnosis not present

## 2016-07-20 DIAGNOSIS — M810 Age-related osteoporosis without current pathological fracture: Secondary | ICD-10-CM | POA: Diagnosis not present

## 2016-07-20 DIAGNOSIS — R911 Solitary pulmonary nodule: Secondary | ICD-10-CM | POA: Diagnosis not present

## 2016-07-20 DIAGNOSIS — M8580 Other specified disorders of bone density and structure, unspecified site: Secondary | ICD-10-CM | POA: Diagnosis not present

## 2016-07-20 DIAGNOSIS — N183 Chronic kidney disease, stage 3 (moderate): Secondary | ICD-10-CM | POA: Diagnosis not present

## 2016-07-20 DIAGNOSIS — E1122 Type 2 diabetes mellitus with diabetic chronic kidney disease: Secondary | ICD-10-CM | POA: Diagnosis not present

## 2016-07-20 DIAGNOSIS — R2689 Other abnormalities of gait and mobility: Secondary | ICD-10-CM | POA: Diagnosis not present

## 2016-07-20 DIAGNOSIS — M6281 Muscle weakness (generalized): Secondary | ICD-10-CM | POA: Diagnosis not present

## 2016-07-22 DIAGNOSIS — N183 Chronic kidney disease, stage 3 (moderate): Secondary | ICD-10-CM | POA: Diagnosis not present

## 2016-07-22 DIAGNOSIS — M810 Age-related osteoporosis without current pathological fracture: Secondary | ICD-10-CM | POA: Diagnosis not present

## 2016-07-22 DIAGNOSIS — M8580 Other specified disorders of bone density and structure, unspecified site: Secondary | ICD-10-CM | POA: Diagnosis not present

## 2016-07-22 DIAGNOSIS — M6281 Muscle weakness (generalized): Secondary | ICD-10-CM | POA: Diagnosis not present

## 2016-07-22 DIAGNOSIS — R2689 Other abnormalities of gait and mobility: Secondary | ICD-10-CM | POA: Diagnosis not present

## 2016-07-22 DIAGNOSIS — E559 Vitamin D deficiency, unspecified: Secondary | ICD-10-CM | POA: Diagnosis not present

## 2016-07-22 DIAGNOSIS — R911 Solitary pulmonary nodule: Secondary | ICD-10-CM | POA: Diagnosis not present

## 2016-07-22 DIAGNOSIS — E1122 Type 2 diabetes mellitus with diabetic chronic kidney disease: Secondary | ICD-10-CM | POA: Diagnosis not present

## 2016-07-22 DIAGNOSIS — I129 Hypertensive chronic kidney disease with stage 1 through stage 4 chronic kidney disease, or unspecified chronic kidney disease: Secondary | ICD-10-CM | POA: Diagnosis not present

## 2016-07-27 DIAGNOSIS — M6281 Muscle weakness (generalized): Secondary | ICD-10-CM | POA: Diagnosis not present

## 2016-07-27 DIAGNOSIS — R2689 Other abnormalities of gait and mobility: Secondary | ICD-10-CM | POA: Diagnosis not present

## 2016-07-27 DIAGNOSIS — E1122 Type 2 diabetes mellitus with diabetic chronic kidney disease: Secondary | ICD-10-CM | POA: Diagnosis not present

## 2016-07-27 DIAGNOSIS — N183 Chronic kidney disease, stage 3 (moderate): Secondary | ICD-10-CM | POA: Diagnosis not present

## 2016-07-27 DIAGNOSIS — M8580 Other specified disorders of bone density and structure, unspecified site: Secondary | ICD-10-CM | POA: Diagnosis not present

## 2016-07-27 DIAGNOSIS — M810 Age-related osteoporosis without current pathological fracture: Secondary | ICD-10-CM | POA: Diagnosis not present

## 2016-07-27 DIAGNOSIS — E559 Vitamin D deficiency, unspecified: Secondary | ICD-10-CM | POA: Diagnosis not present

## 2016-07-27 DIAGNOSIS — I129 Hypertensive chronic kidney disease with stage 1 through stage 4 chronic kidney disease, or unspecified chronic kidney disease: Secondary | ICD-10-CM | POA: Diagnosis not present

## 2016-07-27 DIAGNOSIS — R911 Solitary pulmonary nodule: Secondary | ICD-10-CM | POA: Diagnosis not present

## 2016-07-29 DIAGNOSIS — R911 Solitary pulmonary nodule: Secondary | ICD-10-CM | POA: Diagnosis not present

## 2016-07-29 DIAGNOSIS — E559 Vitamin D deficiency, unspecified: Secondary | ICD-10-CM | POA: Diagnosis not present

## 2016-07-29 DIAGNOSIS — R2689 Other abnormalities of gait and mobility: Secondary | ICD-10-CM | POA: Diagnosis not present

## 2016-07-29 DIAGNOSIS — M6281 Muscle weakness (generalized): Secondary | ICD-10-CM | POA: Diagnosis not present

## 2016-07-29 DIAGNOSIS — I129 Hypertensive chronic kidney disease with stage 1 through stage 4 chronic kidney disease, or unspecified chronic kidney disease: Secondary | ICD-10-CM | POA: Diagnosis not present

## 2016-07-29 DIAGNOSIS — M8580 Other specified disorders of bone density and structure, unspecified site: Secondary | ICD-10-CM | POA: Diagnosis not present

## 2016-07-29 DIAGNOSIS — M810 Age-related osteoporosis without current pathological fracture: Secondary | ICD-10-CM | POA: Diagnosis not present

## 2016-07-29 DIAGNOSIS — N183 Chronic kidney disease, stage 3 (moderate): Secondary | ICD-10-CM | POA: Diagnosis not present

## 2016-07-29 DIAGNOSIS — E1122 Type 2 diabetes mellitus with diabetic chronic kidney disease: Secondary | ICD-10-CM | POA: Diagnosis not present

## 2016-08-03 DIAGNOSIS — M6281 Muscle weakness (generalized): Secondary | ICD-10-CM | POA: Diagnosis not present

## 2016-08-03 DIAGNOSIS — E559 Vitamin D deficiency, unspecified: Secondary | ICD-10-CM | POA: Diagnosis not present

## 2016-08-03 DIAGNOSIS — R809 Proteinuria, unspecified: Secondary | ICD-10-CM | POA: Diagnosis not present

## 2016-08-03 DIAGNOSIS — R600 Localized edema: Secondary | ICD-10-CM | POA: Diagnosis not present

## 2016-08-03 DIAGNOSIS — M810 Age-related osteoporosis without current pathological fracture: Secondary | ICD-10-CM | POA: Diagnosis not present

## 2016-08-03 DIAGNOSIS — E1122 Type 2 diabetes mellitus with diabetic chronic kidney disease: Secondary | ICD-10-CM | POA: Diagnosis not present

## 2016-08-03 DIAGNOSIS — I10 Essential (primary) hypertension: Secondary | ICD-10-CM | POA: Diagnosis not present

## 2016-08-03 DIAGNOSIS — N189 Chronic kidney disease, unspecified: Secondary | ICD-10-CM | POA: Diagnosis not present

## 2016-08-03 DIAGNOSIS — I129 Hypertensive chronic kidney disease with stage 1 through stage 4 chronic kidney disease, or unspecified chronic kidney disease: Secondary | ICD-10-CM | POA: Diagnosis not present

## 2016-08-03 DIAGNOSIS — R911 Solitary pulmonary nodule: Secondary | ICD-10-CM | POA: Diagnosis not present

## 2016-08-03 DIAGNOSIS — N183 Chronic kidney disease, stage 3 (moderate): Secondary | ICD-10-CM | POA: Diagnosis not present

## 2016-08-03 DIAGNOSIS — M8580 Other specified disorders of bone density and structure, unspecified site: Secondary | ICD-10-CM | POA: Diagnosis not present

## 2016-08-03 DIAGNOSIS — R2689 Other abnormalities of gait and mobility: Secondary | ICD-10-CM | POA: Diagnosis not present

## 2016-08-06 DIAGNOSIS — I129 Hypertensive chronic kidney disease with stage 1 through stage 4 chronic kidney disease, or unspecified chronic kidney disease: Secondary | ICD-10-CM | POA: Diagnosis not present

## 2016-08-06 DIAGNOSIS — R911 Solitary pulmonary nodule: Secondary | ICD-10-CM | POA: Diagnosis not present

## 2016-08-06 DIAGNOSIS — N183 Chronic kidney disease, stage 3 (moderate): Secondary | ICD-10-CM | POA: Diagnosis not present

## 2016-08-06 DIAGNOSIS — R2689 Other abnormalities of gait and mobility: Secondary | ICD-10-CM | POA: Diagnosis not present

## 2016-08-06 DIAGNOSIS — E1122 Type 2 diabetes mellitus with diabetic chronic kidney disease: Secondary | ICD-10-CM | POA: Diagnosis not present

## 2016-08-06 DIAGNOSIS — E559 Vitamin D deficiency, unspecified: Secondary | ICD-10-CM | POA: Diagnosis not present

## 2016-08-06 DIAGNOSIS — M810 Age-related osteoporosis without current pathological fracture: Secondary | ICD-10-CM | POA: Diagnosis not present

## 2016-08-06 DIAGNOSIS — M8580 Other specified disorders of bone density and structure, unspecified site: Secondary | ICD-10-CM | POA: Diagnosis not present

## 2016-08-06 DIAGNOSIS — M6281 Muscle weakness (generalized): Secondary | ICD-10-CM | POA: Diagnosis not present

## 2016-08-10 DIAGNOSIS — E559 Vitamin D deficiency, unspecified: Secondary | ICD-10-CM | POA: Diagnosis not present

## 2016-08-10 DIAGNOSIS — N183 Chronic kidney disease, stage 3 (moderate): Secondary | ICD-10-CM | POA: Diagnosis not present

## 2016-08-10 DIAGNOSIS — M810 Age-related osteoporosis without current pathological fracture: Secondary | ICD-10-CM | POA: Diagnosis not present

## 2016-08-10 DIAGNOSIS — R2689 Other abnormalities of gait and mobility: Secondary | ICD-10-CM | POA: Diagnosis not present

## 2016-08-10 DIAGNOSIS — R911 Solitary pulmonary nodule: Secondary | ICD-10-CM | POA: Diagnosis not present

## 2016-08-10 DIAGNOSIS — I129 Hypertensive chronic kidney disease with stage 1 through stage 4 chronic kidney disease, or unspecified chronic kidney disease: Secondary | ICD-10-CM | POA: Diagnosis not present

## 2016-08-10 DIAGNOSIS — M8580 Other specified disorders of bone density and structure, unspecified site: Secondary | ICD-10-CM | POA: Diagnosis not present

## 2016-08-10 DIAGNOSIS — M6281 Muscle weakness (generalized): Secondary | ICD-10-CM | POA: Diagnosis not present

## 2016-08-10 DIAGNOSIS — E1122 Type 2 diabetes mellitus with diabetic chronic kidney disease: Secondary | ICD-10-CM | POA: Diagnosis not present

## 2016-08-12 DIAGNOSIS — R2689 Other abnormalities of gait and mobility: Secondary | ICD-10-CM | POA: Diagnosis not present

## 2016-08-12 DIAGNOSIS — M810 Age-related osteoporosis without current pathological fracture: Secondary | ICD-10-CM | POA: Diagnosis not present

## 2016-08-12 DIAGNOSIS — E559 Vitamin D deficiency, unspecified: Secondary | ICD-10-CM | POA: Diagnosis not present

## 2016-08-12 DIAGNOSIS — M6281 Muscle weakness (generalized): Secondary | ICD-10-CM | POA: Diagnosis not present

## 2016-08-12 DIAGNOSIS — I129 Hypertensive chronic kidney disease with stage 1 through stage 4 chronic kidney disease, or unspecified chronic kidney disease: Secondary | ICD-10-CM | POA: Diagnosis not present

## 2016-08-12 DIAGNOSIS — R911 Solitary pulmonary nodule: Secondary | ICD-10-CM | POA: Diagnosis not present

## 2016-08-12 DIAGNOSIS — E1122 Type 2 diabetes mellitus with diabetic chronic kidney disease: Secondary | ICD-10-CM | POA: Diagnosis not present

## 2016-08-12 DIAGNOSIS — M8580 Other specified disorders of bone density and structure, unspecified site: Secondary | ICD-10-CM | POA: Diagnosis not present

## 2016-08-12 DIAGNOSIS — N183 Chronic kidney disease, stage 3 (moderate): Secondary | ICD-10-CM | POA: Diagnosis not present

## 2016-08-17 DIAGNOSIS — E1122 Type 2 diabetes mellitus with diabetic chronic kidney disease: Secondary | ICD-10-CM | POA: Diagnosis not present

## 2016-08-17 DIAGNOSIS — M6281 Muscle weakness (generalized): Secondary | ICD-10-CM | POA: Diagnosis not present

## 2016-08-17 DIAGNOSIS — R911 Solitary pulmonary nodule: Secondary | ICD-10-CM | POA: Diagnosis not present

## 2016-08-17 DIAGNOSIS — M8580 Other specified disorders of bone density and structure, unspecified site: Secondary | ICD-10-CM | POA: Diagnosis not present

## 2016-08-17 DIAGNOSIS — M810 Age-related osteoporosis without current pathological fracture: Secondary | ICD-10-CM | POA: Diagnosis not present

## 2016-08-17 DIAGNOSIS — I129 Hypertensive chronic kidney disease with stage 1 through stage 4 chronic kidney disease, or unspecified chronic kidney disease: Secondary | ICD-10-CM | POA: Diagnosis not present

## 2016-08-17 DIAGNOSIS — E559 Vitamin D deficiency, unspecified: Secondary | ICD-10-CM | POA: Diagnosis not present

## 2016-08-17 DIAGNOSIS — N183 Chronic kidney disease, stage 3 (moderate): Secondary | ICD-10-CM | POA: Diagnosis not present

## 2016-08-17 DIAGNOSIS — R2689 Other abnormalities of gait and mobility: Secondary | ICD-10-CM | POA: Diagnosis not present

## 2016-08-19 DIAGNOSIS — M8580 Other specified disorders of bone density and structure, unspecified site: Secondary | ICD-10-CM | POA: Diagnosis not present

## 2016-08-19 DIAGNOSIS — E1122 Type 2 diabetes mellitus with diabetic chronic kidney disease: Secondary | ICD-10-CM | POA: Diagnosis not present

## 2016-08-19 DIAGNOSIS — M6281 Muscle weakness (generalized): Secondary | ICD-10-CM | POA: Diagnosis not present

## 2016-08-19 DIAGNOSIS — E559 Vitamin D deficiency, unspecified: Secondary | ICD-10-CM | POA: Diagnosis not present

## 2016-08-19 DIAGNOSIS — M810 Age-related osteoporosis without current pathological fracture: Secondary | ICD-10-CM | POA: Diagnosis not present

## 2016-08-19 DIAGNOSIS — N183 Chronic kidney disease, stage 3 (moderate): Secondary | ICD-10-CM | POA: Diagnosis not present

## 2016-08-19 DIAGNOSIS — R2689 Other abnormalities of gait and mobility: Secondary | ICD-10-CM | POA: Diagnosis not present

## 2016-08-19 DIAGNOSIS — I129 Hypertensive chronic kidney disease with stage 1 through stage 4 chronic kidney disease, or unspecified chronic kidney disease: Secondary | ICD-10-CM | POA: Diagnosis not present

## 2016-08-19 DIAGNOSIS — R911 Solitary pulmonary nodule: Secondary | ICD-10-CM | POA: Diagnosis not present

## 2016-10-08 DIAGNOSIS — E039 Hypothyroidism, unspecified: Secondary | ICD-10-CM | POA: Diagnosis not present

## 2016-10-08 DIAGNOSIS — I129 Hypertensive chronic kidney disease with stage 1 through stage 4 chronic kidney disease, or unspecified chronic kidney disease: Secondary | ICD-10-CM | POA: Diagnosis not present

## 2016-10-08 DIAGNOSIS — E782 Mixed hyperlipidemia: Secondary | ICD-10-CM | POA: Diagnosis not present

## 2016-10-08 DIAGNOSIS — Z0000 Encounter for general adult medical examination without abnormal findings: Secondary | ICD-10-CM | POA: Diagnosis not present

## 2016-10-08 DIAGNOSIS — H6123 Impacted cerumen, bilateral: Secondary | ICD-10-CM | POA: Diagnosis not present

## 2016-10-08 DIAGNOSIS — N183 Chronic kidney disease, stage 3 (moderate): Secondary | ICD-10-CM | POA: Diagnosis not present

## 2016-11-03 ENCOUNTER — Inpatient Hospital Stay (HOSPITAL_COMMUNITY): Payer: Medicare HMO

## 2016-11-03 ENCOUNTER — Emergency Department (HOSPITAL_COMMUNITY): Payer: Medicare HMO

## 2016-11-03 DIAGNOSIS — R4182 Altered mental status, unspecified: Secondary | ICD-10-CM | POA: Diagnosis not present

## 2016-11-03 DIAGNOSIS — R579 Shock, unspecified: Secondary | ICD-10-CM | POA: Diagnosis not present

## 2016-11-03 DIAGNOSIS — I10 Essential (primary) hypertension: Secondary | ICD-10-CM | POA: Diagnosis present

## 2016-11-03 DIAGNOSIS — G931 Anoxic brain damage, not elsewhere classified: Secondary | ICD-10-CM | POA: Diagnosis present

## 2016-11-03 DIAGNOSIS — Z8249 Family history of ischemic heart disease and other diseases of the circulatory system: Secondary | ICD-10-CM

## 2016-11-03 DIAGNOSIS — R4020 Unspecified coma: Secondary | ICD-10-CM | POA: Diagnosis present

## 2016-11-03 DIAGNOSIS — I700 Atherosclerosis of aorta: Secondary | ICD-10-CM | POA: Diagnosis present

## 2016-11-03 DIAGNOSIS — I213 ST elevation (STEMI) myocardial infarction of unspecified site: Principal | ICD-10-CM | POA: Diagnosis present

## 2016-11-03 DIAGNOSIS — Z91018 Allergy to other foods: Secondary | ICD-10-CM | POA: Diagnosis not present

## 2016-11-03 DIAGNOSIS — J9600 Acute respiratory failure, unspecified whether with hypoxia or hypercapnia: Secondary | ICD-10-CM | POA: Diagnosis not present

## 2016-11-03 DIAGNOSIS — R55 Syncope and collapse: Secondary | ICD-10-CM | POA: Diagnosis not present

## 2016-11-03 DIAGNOSIS — R402431 Glasgow coma scale score 3-8, in the field [EMT or ambulance]: Secondary | ICD-10-CM | POA: Diagnosis not present

## 2016-11-03 DIAGNOSIS — I2510 Atherosclerotic heart disease of native coronary artery without angina pectoris: Secondary | ICD-10-CM | POA: Diagnosis present

## 2016-11-03 DIAGNOSIS — Z515 Encounter for palliative care: Secondary | ICD-10-CM | POA: Diagnosis not present

## 2016-11-03 DIAGNOSIS — Z681 Body mass index (BMI) 19 or less, adult: Secondary | ICD-10-CM | POA: Diagnosis not present

## 2016-11-03 DIAGNOSIS — Z66 Do not resuscitate: Secondary | ICD-10-CM | POA: Diagnosis present

## 2016-11-03 DIAGNOSIS — G40901 Epilepsy, unspecified, not intractable, with status epilepticus: Secondary | ICD-10-CM | POA: Diagnosis present

## 2016-11-03 DIAGNOSIS — K429 Umbilical hernia without obstruction or gangrene: Secondary | ICD-10-CM | POA: Diagnosis present

## 2016-11-03 DIAGNOSIS — J690 Pneumonitis due to inhalation of food and vomit: Secondary | ICD-10-CM | POA: Diagnosis not present

## 2016-11-03 DIAGNOSIS — N179 Acute kidney failure, unspecified: Secondary | ICD-10-CM | POA: Diagnosis present

## 2016-11-03 DIAGNOSIS — G9340 Encephalopathy, unspecified: Secondary | ICD-10-CM

## 2016-11-03 DIAGNOSIS — R402432 Glasgow coma scale score 3-8, at arrival to emergency department: Secondary | ICD-10-CM | POA: Diagnosis present

## 2016-11-03 DIAGNOSIS — Z931 Gastrostomy status: Secondary | ICD-10-CM

## 2016-11-03 DIAGNOSIS — Z8673 Personal history of transient ischemic attack (TIA), and cerebral infarction without residual deficits: Secondary | ICD-10-CM | POA: Diagnosis not present

## 2016-11-03 DIAGNOSIS — R400 Somnolence: Secondary | ICD-10-CM

## 2016-11-03 DIAGNOSIS — I252 Old myocardial infarction: Secondary | ICD-10-CM

## 2016-11-03 DIAGNOSIS — E785 Hyperlipidemia, unspecified: Secondary | ICD-10-CM | POA: Diagnosis present

## 2016-11-03 DIAGNOSIS — E872 Acidosis: Secondary | ICD-10-CM | POA: Diagnosis present

## 2016-11-03 DIAGNOSIS — Z880 Allergy status to penicillin: Secondary | ICD-10-CM | POA: Diagnosis not present

## 2016-11-03 DIAGNOSIS — R578 Other shock: Secondary | ICD-10-CM | POA: Diagnosis not present

## 2016-11-03 DIAGNOSIS — R4189 Other symptoms and signs involving cognitive functions and awareness: Secondary | ICD-10-CM

## 2016-11-03 DIAGNOSIS — J9690 Respiratory failure, unspecified, unspecified whether with hypoxia or hypercapnia: Secondary | ICD-10-CM | POA: Diagnosis not present

## 2016-11-03 DIAGNOSIS — Z9289 Personal history of other medical treatment: Secondary | ICD-10-CM

## 2016-11-03 DIAGNOSIS — I2101 ST elevation (STEMI) myocardial infarction involving left main coronary artery: Secondary | ICD-10-CM

## 2016-11-03 DIAGNOSIS — J9601 Acute respiratory failure with hypoxia: Secondary | ICD-10-CM | POA: Diagnosis not present

## 2016-11-03 DIAGNOSIS — R6889 Other general symptoms and signs: Secondary | ICD-10-CM | POA: Diagnosis not present

## 2016-11-03 DIAGNOSIS — Z87891 Personal history of nicotine dependence: Secondary | ICD-10-CM | POA: Diagnosis not present

## 2016-11-03 DIAGNOSIS — Z833 Family history of diabetes mellitus: Secondary | ICD-10-CM

## 2016-11-03 DIAGNOSIS — Z4682 Encounter for fitting and adjustment of non-vascular catheter: Secondary | ICD-10-CM | POA: Diagnosis not present

## 2016-11-03 LAB — TROPONIN I
TROPONIN I: 61.9 ng/mL — AB (ref <0.03)
Troponin I: >65 ng/mL (ref <0.03)

## 2016-11-03 LAB — GLUCOSE, CAPILLARY
GLUCOSE-CAPILLARY: 143 mg/dL — AB (ref 65–99)
GLUCOSE-CAPILLARY: 91 mg/dL (ref 65–99)
Glucose-Capillary: 134 mg/dL — ABNORMAL HIGH (ref 65–99)

## 2016-11-03 LAB — POCT I-STAT 3, ART BLOOD GAS (G3+)
ACID-BASE DEFICIT: 3 mmol/L — AB (ref 0.0–2.0)
BICARBONATE: 19.2 mmol/L — AB (ref 20.0–28.0)
O2 Saturation: 100 %
TCO2: 20 mmol/L (ref 0–100)
pCO2 arterial: 25.7 mmHg — ABNORMAL LOW (ref 32.0–48.0)
pH, Arterial: 7.482 — ABNORMAL HIGH (ref 7.350–7.450)
pO2, Arterial: 488 mmHg — ABNORMAL HIGH (ref 83.0–108.0)

## 2016-11-03 LAB — CBC
HCT: 35.8 % — ABNORMAL LOW (ref 36.0–46.0)
Hemoglobin: 11.7 g/dL — ABNORMAL LOW (ref 12.0–15.0)
MCH: 31.2 pg (ref 26.0–34.0)
MCHC: 32.7 g/dL (ref 30.0–36.0)
MCV: 95.5 fL (ref 78.0–100.0)
PLATELETS: 222 10*3/uL (ref 150–400)
RBC: 3.75 MIL/uL — ABNORMAL LOW (ref 3.87–5.11)
RDW: 13.5 % (ref 11.5–15.5)
WBC: 11.7 10*3/uL — ABNORMAL HIGH (ref 4.0–10.5)

## 2016-11-03 LAB — URINALYSIS, ROUTINE W REFLEX MICROSCOPIC
BILIRUBIN URINE: NEGATIVE
Glucose, UA: NEGATIVE mg/dL
Ketones, ur: NEGATIVE mg/dL
LEUKOCYTES UA: NEGATIVE
NITRITE: NEGATIVE
PH: 5 (ref 5.0–8.0)
Protein, ur: 100 mg/dL — AB
SPECIFIC GRAVITY, URINE: 1.013 (ref 1.005–1.030)

## 2016-11-03 LAB — COMPREHENSIVE METABOLIC PANEL
ALBUMIN: 3.9 g/dL (ref 3.5–5.0)
ALT: 16 U/L (ref 14–54)
AST: 149 U/L — AB (ref 15–41)
Alkaline Phosphatase: 52 U/L (ref 38–126)
Anion gap: 14 (ref 5–15)
BUN: 45 mg/dL — AB (ref 6–20)
CHLORIDE: 112 mmol/L — AB (ref 101–111)
CO2: 19 mmol/L — AB (ref 22–32)
Calcium: 9.8 mg/dL (ref 8.9–10.3)
Creatinine, Ser: 2.76 mg/dL — ABNORMAL HIGH (ref 0.44–1.00)
GFR calc Af Amer: 16 mL/min — ABNORMAL LOW (ref >60)
GFR calc non Af Amer: 14 mL/min — ABNORMAL LOW (ref >60)
Glucose, Bld: 191 mg/dL — ABNORMAL HIGH (ref 65–99)
Potassium: 4 mmol/L (ref 3.5–5.1)
SODIUM: 145 mmol/L (ref 135–145)
Total Bilirubin: 0.8 mg/dL (ref 0.3–1.2)
Total Protein: 7.1 g/dL (ref 6.5–8.1)

## 2016-11-03 LAB — I-STAT CG4 LACTIC ACID, ED: LACTIC ACID, VENOUS: 3.23 mmol/L — AB (ref 0.5–1.9)

## 2016-11-03 LAB — MRSA PCR SCREENING: MRSA BY PCR: NEGATIVE

## 2016-11-03 LAB — I-STAT CHEM 8, ED
BUN: 48 mg/dL — ABNORMAL HIGH (ref 6–20)
Calcium, Ion: 1.22 mmol/L (ref 1.15–1.40)
Chloride: 114 mmol/L — ABNORMAL HIGH (ref 101–111)
Creatinine, Ser: 2.7 mg/dL — ABNORMAL HIGH (ref 0.44–1.00)
Glucose, Bld: 187 mg/dL — ABNORMAL HIGH (ref 65–99)
HEMATOCRIT: 35 % — AB (ref 36.0–46.0)
HEMOGLOBIN: 11.9 g/dL — AB (ref 12.0–15.0)
POTASSIUM: 4 mmol/L (ref 3.5–5.1)
Sodium: 145 mmol/L (ref 135–145)
TCO2: 23 mmol/L (ref 0–100)

## 2016-11-03 LAB — APTT: APTT: 27 s (ref 24–36)

## 2016-11-03 LAB — TRIGLYCERIDES
TRIGLYCERIDES: 136 mg/dL (ref <150)
TRIGLYCERIDES: 76 mg/dL (ref <150)

## 2016-11-03 LAB — PROTIME-INR
INR: 1.05
PROTHROMBIN TIME: 13.7 s (ref 11.4–15.2)

## 2016-11-03 LAB — MAGNESIUM: Magnesium: 1.9 mg/dL (ref 1.7–2.4)

## 2016-11-03 LAB — CREATININE, SERUM
Creatinine, Ser: 2.22 mg/dL — ABNORMAL HIGH (ref 0.44–1.00)
GFR calc Af Amer: 21 mL/min — ABNORMAL LOW (ref >60)
GFR calc non Af Amer: 18 mL/min — ABNORMAL LOW (ref >60)

## 2016-11-03 MED ORDER — ONDANSETRON HCL 4 MG/2ML IJ SOLN: 4.0000 mg | Freq: Four times a day (QID) | INTRAMUSCULAR | Status: DC | PRN

## 2016-11-03 MED ORDER — LORAZEPAM 2 MG/ML IJ SOLN
1.0000 mg | Freq: Four times a day (QID) | INTRAMUSCULAR | Status: DC | PRN
  Administered 2016-11-05: 1 mg via INTRAVENOUS
  Filled 2016-11-03: qty 1

## 2016-11-03 MED ORDER — HEPARIN SODIUM (PORCINE) 5000 UNIT/ML IJ SOLN: 5000.0000 [IU] | Freq: Three times a day (TID) | INTRAMUSCULAR | Status: DC

## 2016-11-03 MED ORDER — ASPIRIN 81 MG PO CHEW: 324.0000 mg | CHEWABLE_TABLET | ORAL | Status: DC

## 2016-11-03 MED ORDER — DEXTROSE 5 % IV SOLN: 1.0000 g | Freq: Once | INTRAVENOUS | Status: DC

## 2016-11-03 MED ORDER — MIDAZOLAM HCL 2 MG/2ML IJ SOLN
INTRAMUSCULAR | Status: AC
  Filled 2016-11-03: qty 2

## 2016-11-03 MED ORDER — PROPOFOL 1000 MG/100ML IV EMUL
5.0000 ug/kg/min | INTRAVENOUS | Status: DC
Start: 2016-11-03 — End: 2016-11-04
  Filled 2016-11-03: qty 100

## 2016-11-03 MED ORDER — FENTANYL CITRATE (PF) 2500 MCG/50ML IJ SOLN
25.0000 ug/h | INTRAMUSCULAR | Status: DC
  Filled 2016-11-03: qty 50

## 2016-11-03 MED ORDER — FENTANYL BOLUS VIA INFUSION
25.0000 ug | INTRAVENOUS | Status: DC | PRN
  Filled 2016-11-03: qty 25

## 2016-11-03 MED ORDER — PROPOFOL 1000 MG/100ML IV EMUL
0.0000 ug/kg/min | INTRAVENOUS | Status: DC
  Administered 2016-11-03: 10 ug/kg/min via INTRAVENOUS

## 2016-11-03 MED ORDER — INSULIN ASPART 100 UNIT/ML ~~LOC~~ SOLN
2.0000 [IU] | SUBCUTANEOUS | Status: DC
  Administered 2016-11-03: 2 [IU] via SUBCUTANEOUS

## 2016-11-03 MED ORDER — CEFTRIAXONE SODIUM 2 G IJ SOLR
2.0000 g | INTRAMUSCULAR | Status: DC
  Administered 2016-11-03: 2 g via INTRAVENOUS
  Filled 2016-11-03 (×2): qty 2

## 2016-11-03 MED ORDER — MIDAZOLAM HCL 2 MG/2ML IJ SOLN: 1.0000 mg | INTRAMUSCULAR | Status: DC | PRN

## 2016-11-03 MED ORDER — SODIUM CHLORIDE 0.9 % IV SOLN
500.0000 mg | Freq: Two times a day (BID) | INTRAVENOUS | Status: DC
  Administered 2016-11-03 – 2016-11-04 (×3): 500 mg via INTRAVENOUS
  Filled 2016-11-03 (×4): qty 5

## 2016-11-03 MED ORDER — MIDAZOLAM HCL 2 MG/2ML IJ SOLN
2.0000 mg | Freq: Once | INTRAMUSCULAR | Status: AC
  Administered 2016-11-03: 2 mg via INTRAVENOUS

## 2016-11-03 MED ORDER — HEPARIN (PORCINE) IN NACL 100-0.45 UNIT/ML-% IJ SOLN
450.0000 [IU]/h | INTRAMUSCULAR | Status: DC
  Administered 2016-11-03: 550 [IU]/h via INTRAVENOUS
  Filled 2016-11-03: qty 250

## 2016-11-03 MED ORDER — SODIUM CHLORIDE 0.9 % IV BOLUS (SEPSIS)
1000.0000 mL | Freq: Once | INTRAVENOUS | Status: AC
Start: 2016-11-03 — End: 2016-11-03
  Administered 2016-11-03: 1000 mL via INTRAVENOUS

## 2016-11-03 MED ORDER — FENTANYL CITRATE (PF) 100 MCG/2ML IJ SOLN
50.0000 ug | Freq: Once | INTRAMUSCULAR | Status: DC
  Filled 2016-11-03: qty 2

## 2016-11-03 MED ORDER — PROPOFOL 1000 MG/100ML IV EMUL
INTRAVENOUS | Status: AC
  Filled 2016-11-03: qty 100

## 2016-11-03 MED ORDER — HEPARIN BOLUS VIA INFUSION
2500.0000 [IU] | Freq: Once | INTRAVENOUS | Status: AC
  Administered 2016-11-03: 2500 [IU] via INTRAVENOUS
  Filled 2016-11-03: qty 2500

## 2016-11-03 MED ORDER — FAMOTIDINE 40 MG/5ML PO SUSR
20.0000 mg | Freq: Two times a day (BID) | ORAL | Status: DC
  Administered 2016-11-03 – 2016-11-04 (×2): 20 mg
  Filled 2016-11-03 (×2): qty 2.5

## 2016-11-03 MED ORDER — ACETAMINOPHEN 325 MG PO TABS: 650.0000 mg | ORAL_TABLET | ORAL | Status: DC | PRN

## 2016-11-03 MED ORDER — SODIUM CHLORIDE 0.9 % IV SOLN: INTRAVENOUS | Status: DC

## 2016-11-03 MED ORDER — ORAL CARE MOUTH RINSE
15.0000 mL | Freq: Four times a day (QID) | OROMUCOSAL | Status: DC
  Administered 2016-11-04 – 2016-11-05 (×7): 15 mL via OROMUCOSAL

## 2016-11-03 MED ORDER — DOXYCYCLINE HYCLATE 100 MG IV SOLR
100.0000 mg | Freq: Once | INTRAVENOUS | Status: DC
  Filled 2016-11-03: qty 100

## 2016-11-03 MED ORDER — CHLORHEXIDINE GLUCONATE 0.12% ORAL RINSE (MEDLINE KIT)
15.0000 mL | Freq: Two times a day (BID) | OROMUCOSAL | Status: DC
  Administered 2016-11-03 – 2016-11-05 (×4): 15 mL via OROMUCOSAL

## 2016-11-03 MED ORDER — MIDAZOLAM HCL 2 MG/2ML IJ SOLN
INTRAMUSCULAR | Status: AC
Start: 2016-11-03 — End: 2016-11-04
  Filled 2016-11-03: qty 2

## 2016-11-03 MED ORDER — ASPIRIN 300 MG RE SUPP: 300.0000 mg | RECTAL | Status: DC

## 2016-11-03 MED ORDER — ETOMIDATE 2 MG/ML IV SOLN
INTRAVENOUS | Status: AC | PRN
  Administered 2016-11-03: 15 mg via INTRAVENOUS

## 2016-11-03 MED ORDER — SODIUM CHLORIDE 0.9 % IV SOLN: 250.0000 mL | INTRAVENOUS | Status: DC | PRN

## 2016-11-03 MED ORDER — SODIUM CHLORIDE 0.9 % IV SOLN
INTRAVENOUS | Status: DC
  Administered 2016-11-03: 23:00:00 via INTRAVENOUS

## 2016-11-03 NOTE — Progress Notes (Signed)
eLink Physician-Brief Progress Note Patient Name: Sheila Murphy DOB: Nov 13, 1921 MRN: [id number]   Date of Service  10/27/2016  HPI/Events of Note  Notified by bedside nurse of worsening hypotension/shock. Admitted with non-STEMI. Currently on FiO2 0.4.   eICU Interventions  Normal saline 1 L IV fluid bolus now      Intervention Category Major Interventions: Shock - evaluation and management  Lawanda CousinsJennings Loanne Emery 11/16/2016, 6:41 PM

## 2016-11-03 NOTE — ED Notes (Signed)
Dr. Rhunette CroftNanavati calling to speak with family about wishes for patient.

## 2016-11-03 NOTE — Progress Notes (Signed)
eLink Physician-Brief Progress Note Patient Name: Sheila Murphy DOB: October 27, 1921 MRN: [id number]   Date of Service  10/27/2016  HPI/Events of Note  Patient started on Unasyn for aspiration pneumonia. Notified by pharmacy patient has a penicillin allergy. Reportedly has tolerated Rocephin in the past.   eICU Interventions  Switched from Unasyn to Rocephin      Intervention Category Intermediate Interventions: Other:  Lawanda CousinsJennings Nestor 11/04/2016, 4:45 PM

## 2016-11-03 NOTE — ED Notes (Signed)
Patient undressed, in gown, on monitor, continuous pulse oximetry and blood pressure cuff; RT maintaining airway 

## 2016-11-03 NOTE — Progress Notes (Addendum)
Seizure like activity per RN when decreased propofol gtt. No activity now that propofol increased again. Will give Keppra 500mg  IV BID (CrCl <30). Ativan 1mg  q6hr prn seizure.  Griffin BasilJennifer Krall, MD  Internal Medicine PGY-3  Seizure-like activity started and Propofol drip was not on but in the room. I turned it to 10 to see if it would stop the seizure. Suzy Bouchardhompson, Lidiya Reise E, RN 11/04/2016 4:54 AM

## 2016-11-03 NOTE — Progress Notes (Signed)
Per resident OGT appears to be in place and is ok to use if auscultated. Melina Schoolshompson, Walter Grima E, [place]RN 11/06/2016 7:27 PM

## 2016-11-03 NOTE — Consult Note (Signed)
Cardiology Consult    Patient ID: TAYTUM SCHECK MRN: [id number], DOB/AGE: 1922/06/30   Admit date: Nov 23, 2016 Date of Consult: 11-23-16  Primary Physician: Gwynneth Aliment, MD Reason for Consult: STEMI Primary Cardiologist: Dr. Herbie Baltimore Requesting Provider: Dr. Molli Knock   History of Present Illness    Sheila Murphy is a 83 y.o. female with past medical history of carotid artery disease, HTN, HLD, orthostatic hypotension, and prior GIB (occuring in 10/2014) who presented to [facility] ED on 2016-11-23 after being found unresponsive and having witnessed seizure-like activity with involuntary movement of her upper extremities.   Initial EKG showed sinus tachycardia, HR 109, with anterior ST elevation. CODE STEMI was cancelled as she thought to not be a cardiac cath candidate. She was intubated while in the ED.  Initial labs show K+ 4.0, creatinine 2.76 (was 1.97 five months prior), Mg 1.9. Initial troponin elevated at > 65.00. Lactic Acid 3.23. CXR shows patchy airspace opacity in the right mid lung and cannot exclude pneumonic consolidation. CT Head performed which shows atrophy and small vessel disease with no advanced vascular calcification.   She is currently intubated and sedated, unable to contribute to history at this time. Has been started on Rocephin for possible aspiration and PNA. Also started on Heparin in the setting of ACS.    Past Medical History   Past Medical History:  Diagnosis Date  . Aortic valve sclerosis 2004   Echo showed normal LV function, some mild diastolic dysfunction. Aortic sclerosis but no stenosis.  Marland Kitchen CAD (coronary artery disease)   . Carotid artery disease without cerebral infarction [facility])    Right internal carotid: 70-90%, left 60-70%; asymptomatic --> moderately increased RICA velocities on 01/2014 dopplers.  . Dyslipidemia    On Mevacor - followed by PCP  . Glaucoma   . Hiatal hernia   . History of heart attack 1960s   No WMA on Echo  . HTN  (hypertension)   . Myocardial infarction     Past Surgical History:  Procedure Laterality Date  . APPENDECTOMY    . CAROTID DOPPLER  2011; 2015   RICA 70-99% diameter reduction, LICA 50-69% diameter reduction, right RCA significantly elevated velocities;; moderately increased when compared to 2011.  . ESOPHAGOGASTRODUODENOSCOPY N/A 12/06/2014   Procedure: ESOPHAGOGASTRODUODENOSCOPY (EGD);  Surgeon: Vertell Novak., MD;  Location: [facility] ENDOSCOPY;  Service: Endoscopy;  Laterality: N/A;  . ESOPHAGOGASTRODUODENOSCOPY N/A 12/09/2014   Procedure: ESOPHAGOGASTRODUODENOSCOPY (EGD);  Surgeon: Willis Modena, MD;  Location: [facility] ENDOSCOPY;  Service: Endoscopy;  Laterality: N/A;  . HIATAL HERNIA REPAIR    . RADIOLOGY WITH ANESTHESIA N/A 12/09/2014   Procedure: RADIOLOGY WITH ANESTHESIA;  Surgeon: Medication Radiologist, MD;  Location: [facility] OR;  Service: Radiology;  Laterality: N/A;  . TRANSTHORACIC ECHOCARDIOGRAM  2004   LV EF normal, LA mildly dilated, arial septum is aneurysmal, mild aortic sclerosis, mild mitral annular calcification, mild MR, mild TR, RVSP 40-17mmHg (mod pulm HTN)     Allergies  Allergies  Allergen Reactions  . Grapefruit Concentrate Other (See Comments)    Reaction unknown  . Penicillins Other (See Comments)    Has patient had a PCN reaction causing immediate rash, facial/tongue/throat swelling, SOB or lightheadedness with hypotension: Unk Has patient had a PCN reaction causing severe rash involving mucus membranes or skin necrosis: Unk Has patient had a PCN reaction that required hospitalization: Unk Has patient had a PCN reaction occurring within the last 10 years: No If all of the above answers are "NO",  then may proceed with Cephalosporin use.     Inpatient Medications    . aspirin  324 mg Oral NOW   Or  . aspirin  300 mg Rectal NOW  . famotidine  20 mg Per Tube BID  . heparin  2,500 Units Intravenous Once  . midazolam      . midazolam        Family History      Family History  Problem Relation Age of Onset  . Heart attack Father   . Other      Noncontributory, deferred due to age  . Hyperlipidemia Daughter   . Hypertension Daughter   . Diabetes Son   . Heart disease Son   . Hyperlipidemia Son   . Kidney disease Son   . Heart attack Son     Social History    Social History   Social History  . Marital status: Widowed    Spouse name: N/A  . Number of children: 6  . Years of education: 44   Occupational History  .  Retired   Social History Main Topics  . Smoking status: Former Smoker    Quit date: 03/07/1975  . Smokeless tobacco: Not on file  . Alcohol use No  . Drug use: No  . Sexual activity: Not on file   Other Topics Concern  . Not on file   Social History Narrative   Widowed mother of 5 with one deceased child. She has 3 of her children the spend significant time with her 2 living in her home. 23 grandchildren 18 great grandchildren. 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 As of now the stairs of alcohol house without any problems. 2 - 3 sons that are her caregivers providing her meals and transportation.     Review of Systems    Unable to be obtained secondary to acute medical condition. Currently intubated and sedated.   Physical Exam    Blood pressure 117/63, pulse 77, temperature 99.7 F (37.6 C), temperature source Rectal, resp. rate 18, height 5\' 4"  (1.626 m), SpO2 100 %.   General: Elderly African American female appearing in NAD. Intubated and sedated.  Psych: Normal affect. Neuro: Unable to be assessed. Currently sedated.  HEENT: Normal  Neck: Supple without bruits or JVD. Lungs:  Resp regular and unlabored, CTA without wheezing or rales. Heart: RRR no s3, s4, or murmurs. Abdomen: Soft, non-tender, non-distended, BS + x 4. Mass along right umbilical region.  Extremities: No clubbing, cyanosis or edema. DP/PT/Radials 1+ and equal bilaterally.  Labs     Troponin (Point of Care Test) No results for input(s): TROPIPOC in the last 72 hours.  Recent Labs  2016/11/12 1505  TROPONINI >65.00*   Lab Results  Component Value Date   WBC 6.6 06/04/2016   HGB 11.9 (L) 2016-11-12   HCT 35.0 (L) 2016-11-12   MCV 99.1 06/04/2016   PLT 300 06/04/2016    Recent Labs Lab Nov 12, 2016 1505 Nov 12, 2016 1536  NA 145 145  K 4.0 4.0  CL 112* 114*  CO2 19*  --   BUN 45* 48*  CREATININE 2.76* 2.70*  CALCIUM 9.8  --   PROT 7.1  --   BILITOT 0.8  --   ALKPHOS 52  --   ALT 16  --   AST 149*  --   GLUCOSE 191* 187*   Lab Results  Component Value Date   TRIG 136 2016-11-12   No  results found for: [facility]   Radiology Studies    Ct Head Wo Contrast  Result Date: 10/28/2016 CLINICAL DATA:  Patient on ventilator.  Unresponsive. EXAM: CT HEAD WITHOUT CONTRAST TECHNIQUE: Contiguous axial images were obtained from the base of the skull through the vertex without intravenous contrast. COMPARISON:  11/24/2011. FINDINGS: Brain: No evidence for acute infarction, hemorrhage, mass lesion, hydrocephalus, or extra-axial fluid. Global atrophy. Hypoattenuation of white matter representing chronic microvascular ischemic change. Vascular: Advanced vascular calcification in the carotid siphons. Skull: No fracture or worrisome osseous lesion. Sinuses/Orbits: Minor layering fluid in the sphenoid, ethmoid, and maxillary sinuses. No significant sinus opacity. No acute orbital findings. Other: Marked calcific pannus surrounds the odontoid. No definite cervicomedullary compression. IMPRESSION: Atrophy and small vessel disease, similar to prior MR. No acute intracranial findings. Minor layering fluid in the sinuses could be secondary to recumbent see. Advanced vascular calcification.  No proximal hyperdense vessel. Electronically Signed   By: Elsie StainJohn T Curnes M.D.   On: 11/17/2016 15:39   Dg Chest Portable 1 View  Result Date: 10/20/2016 CLINICAL DATA:  Respiratory failure,  endotracheal tube position EXAM: PORTABLE CHEST 1 VIEW COMPARISON:  06/04/2016 FINDINGS: The heart size and mediastinal contours are within normal limits. Mild aortic atherosclerosis. Endotracheal tube tip is seen approximately 3.9 cm above the carina in satisfactory position. Patchy opacity in the periphery of the right mid lung is noted. No pneumothorax nor CHF. The visualized skeletal structures are unremarkable. Surgical clips project over the epigastric region. Surgical coils noted in the right upper quadrant. Mild gastric distention with air. No acute osseous appearing abnormality. IMPRESSION: 1. Endotracheal tube tip in satisfactory position at 3.9 cm above the carina. 2. Patchy airspace opacity in the right mid lung cannot exclude in pneumonic consolidation. 3. Aortic atherosclerosis. Electronically Signed   By: Tollie Ethavid  Kwon M.D.   On: 11/10/2016 15:26    EKG & Cardiac Imaging    EKG: Sinus tachycardia, HR 109, with anterior ST elevation  Assessment & Plan    1. Anterior STEMI - [facility] being [facility] and having witnessed seizure-like activity with involuntary movement of her upper extremities. Initial EKG showed ST elevation in the anterior leads. CODE STEMI was cancelled as she thought to not be a cardiac cath candidate.  - Initial troponin elevated at > 65.00.  - will obtain an echocardiogram to assess LV function and wall motion. Continue Heparin for 48 hours as medical treatment. Given ASA. No BB in the setting of hypotension.   2. Possible Aspiration PNA - CXR showed patchy airspace opacity in the right mid lung. Started on empiric Rocephin by admitting team.  3. AKI - creatinine 2.76 on admission (was 1.97 five months prior). - repeat BMET in AM.    Signed, Ellsworth LennoxBrittany M Chauntelle Azpeitia, PA-C 11/02/2016, 5:03 PM Pager: [phone]

## 2016-11-03 NOTE — ED Notes (Signed)
Spoke with daughter, reports patient is full-code, will plan to intubate.

## 2016-11-03 NOTE — Progress Notes (Signed)
eLink Physician-Brief Progress Note Patient Name: Sheila Murphy DOB: May 29, 1922 MRN: [id number]   Date of Service  11/07/2016  HPI/Events of Note  Patient found unresponsive with abnormal movements. Intubated in the emergency department. Currently being treated for aspiration pneumonia.   eICU Interventions  Continuing current plan of care per intensivist      Intervention Category Evaluation Type: New Patient Evaluation  Lawanda CousinsJennings Chet Greenley 10/19/2016, 5:26 PM

## 2016-11-03 NOTE — ED Notes (Signed)
Myself and Sarah, RN changed stretcher linens and placed chuks underneath patient as well as placed a diaper on patient; repositioned patient and gave patient a warm blanket

## 2016-11-03 NOTE — Progress Notes (Signed)
eLink Physician-Brief Progress Note Patient Name: Sheila Murphy DOB: [birth year] MRN: [id number]   Date of Service  2017/05/09  HPI/Events of Note  Notified by bedside nurse that patient is moving some and she is concerned that patient could self extubated. Currently borderline hypotensive. Propofol discontinued.   eICU Interventions  Ordering soft wrist restraints to prevent self extubation in case the patient arouses more      Intervention Category Major Interventions: Other:  Lawanda CousinsJennings Baila Rouse 2017/05/09, 6:09 PM

## 2016-11-03 NOTE — H&P (Signed)
PULMONARY / CRITICAL CARE MEDICINE   Name: Sheila Murphy MRN: [id number] DOB: September 19, 1922    ADMISSION DATE:  November 08, 2016  REFERRING MD:  EDP   CHIEF COMPLAINT:  Respiratory failure   HISTORY OF PRESENT ILLNESS:   83yo female with hx CAD, HTN presented 1/17 after her son found her unresponsive with shaking movements.  EKG was concerning for STEMI, SBP 200's.  Pt intubated in ER and PCCM called for ICU admission.  Deemed not candidate for cath per cardiology.   PAST MEDICAL HISTORY :  She  has a past medical history of Aortic valve sclerosis (2004); CAD (coronary artery disease); Carotid artery disease without cerebral infarction (HCC); Dyslipidemia; Glaucoma; Hiatal hernia; History of heart attack (1960s); HTN (hypertension); and Myocardial infarction.  PAST SURGICAL HISTORY: She  has a past surgical history that includes Appendectomy; Hiatal hernia repair; transthoracic echocardiogram (2004); CAROTID DOPPLER (2011; 2015); Esophagogastroduodenoscopy (N/A, 12/06/2014); Radiology with anesthesia (N/A, 12/09/2014); and Esophagogastroduodenoscopy (N/A, 12/09/2014).  Allergies  Allergen Reactions  . Grapefruit Concentrate Other (See Comments)    Reaction unknown  . Penicillins Other (See Comments)    Has patient had a PCN reaction causing immediate rash, facial/tongue/throat swelling, SOB or lightheadedness with hypotension: Unk Has patient had a PCN reaction causing severe rash involving mucus membranes or skin necrosis: Unk Has patient had a PCN reaction that required hospitalization: Unk Has patient had a PCN reaction occurring within the last 10 years: No If all of the above answers are "NO", then may proceed with Cephalosporin use.     No current facility-administered medications on file prior to encounter.    Current Outpatient Prescriptions on File Prior to Encounter  Medication Sig  . amLODipine (NORVASC) 10 MG tablet Take 1 tablet (10 mg total) by mouth daily. (Patient not taking:  Reported on 06/04/2016)  . bumetanide (BUMEX) 0.5 MG tablet Take 0.5 mg by mouth daily.  Marland Kitchen CALCIUM PO Take by mouth.  . chlorthalidone (HYGROTON) 25 MG tablet Take 1 tablet (25 mg total) by mouth daily.  . cholecalciferol (VITAMIN D) 1000 UNITS tablet Take 1,000 Units by mouth daily.  . furosemide (LASIX) 40 MG tablet Take 40 mg by mouth daily.  Marland Kitchen levothyroxine (SYNTHROID, LEVOTHROID) 25 MCG tablet Take 50 mcg by mouth daily before breakfast.   . lisinopril (PRINIVIL,ZESTRIL) 40 MG tablet Take 40 mg by mouth daily.  Marland Kitchen lisinopril (PRINIVIL,ZESTRIL) 5 MG tablet Take 5 mg by mouth daily.  Marland Kitchen lovastatin (MEVACOR) 20 MG tablet Take 20 mg by mouth daily.   . Multiple Vitamin (MULTIVITAMIN) tablet Take 1 tablet by mouth daily.  . ondansetron (ZOFRAN ODT) 4 MG disintegrating tablet 4mg  ODT q4 hours prn nausea/vomit  . pantoprazole (PROTONIX) 40 MG tablet Take 1 tablet (40 mg total) by mouth 2 (two) times daily before a meal. (Patient not taking: Reported on 07/01/2015)  . polyethylene glycol powder (GLYCOLAX/MIRALAX) powder Take 17 g by mouth See admin instructions. Mix into 8 ounces of fluid of choice and drunk once a day  . potassium chloride (K-DUR) 10 MEQ tablet Take 10 mEq by mouth 2 (two) times daily. Take 2 tablet in the AM and 1 tablet in the PM  . potassium chloride (K-DUR,KLOR-CON) 10 MEQ tablet Take 20 mEq by mouth daily.  . vitamin C (ASCORBIC ACID) 500 MG tablet Take 500 mg by mouth daily.  . vitamin E 400 UNIT capsule Take 400 Units by mouth daily.    FAMILY HISTORY:  Her @FAMSTP (<SUBSCRIPT> error)@  SOCIAL HISTORY: She  reports that she quit smoking about 41 years ago. She does not have any smokeless tobacco history on file. She reports that she does not drink alcohol or use drugs.  REVIEW OF SYSTEMS:   Unable. Pt unresponsive on vent, no family at bedside.   SUBJECTIVE:    VITAL SIGNS: BP 115/67   Pulse 84   Temp 99.7 F (37.6 C) (Rectal)   Resp 15   Ht 5\' 4"  (1.626 m)    SpO2 100%   HEMODYNAMICS:    VENTILATOR SETTINGS: Vent Mode: PRVC FiO2 (%):  [100 %] 100 % Set Rate:  [18 bmp] 18 bmp Vt Set:  [440 mL] 440 mL PEEP:  [5 cmH20] 5 cmH20 Plateau Pressure:  [14 cmH20] 14 cmH20  INTAKE / OUTPUT: No intake/output data recorded.  PHYSICAL EXAMINATION: General:  Frail elderly female, minimally responsive on vent  Neuro:  Unresponsive, no gag, no response to pain  HEENT:  Mm moist, Ett Cardiovascular:  S1S2 RRR Lungs:  resps even non labored on vent, few scattered rhonchi Abdomen:  Soft, hypoactive bs  Musculoskeletal:  Warm and dry, no edema    LABS:  BMET  Recent Labs Lab 14-Nov-2016 1536  NA 145  K 4.0  CL 114*  BUN 48*  CREATININE 2.70*  GLUCOSE 187*    Electrolytes No results for input(s): CALCIUM, MG, PHOS in the last 168 hours.  CBC  Recent Labs Lab 2016-11-14 1536  HGB 11.9*  HCT 35.0*    Coag's  Recent Labs Lab 14-Nov-2016 1505  APTT 27  INR 1.05    Sepsis Markers  Recent Labs Lab 11/14/16 1536  LATICACIDVEN 3.23*    ABG No results for input(s): PHART, PCO2ART, PO2ART in the last 168 hours.  Liver Enzymes No results for input(s): AST, ALT, ALKPHOS, BILITOT, ALBUMIN in the last 168 hours.  Cardiac Enzymes No results for input(s): TROPONINI, PROBNP in the last 168 hours.  Glucose No results for input(s): GLUCAP in the last 168 hours.  Imaging Ct Head Wo Contrast  Result Date: 2016/11/14 CLINICAL DATA:  Patient on ventilator.  Unresponsive. EXAM: CT HEAD WITHOUT CONTRAST TECHNIQUE: Contiguous axial images were obtained from the base of the skull through the vertex without intravenous contrast. COMPARISON:  11/24/2011. FINDINGS: Brain: No evidence for acute infarction, hemorrhage, mass lesion, hydrocephalus, or extra-axial fluid. Global atrophy. Hypoattenuation of white matter representing chronic microvascular ischemic change. Vascular: Advanced vascular calcification in the carotid siphons. Skull: No  fracture or worrisome osseous lesion. Sinuses/Orbits: Minor layering fluid in the sphenoid, ethmoid, and maxillary sinuses. No significant sinus opacity. No acute orbital findings. Other: Marked calcific pannus surrounds the odontoid. No definite cervicomedullary compression. IMPRESSION: Atrophy and small vessel disease, similar to prior MR. No acute intracranial findings. Minor layering fluid in the sinuses could be secondary to recumbent see. Advanced vascular calcification.  No proximal hyperdense vessel. Electronically Signed   By: Elsie Stain M.D.   On: Nov 14, 2016 15:39   Dg Chest Portable 1 View  Result Date: 14-Nov-2016 CLINICAL DATA:  Respiratory failure, endotracheal tube position EXAM: PORTABLE CHEST 1 VIEW COMPARISON:  06/04/2016 FINDINGS: The heart size and mediastinal contours are within normal limits. Mild aortic atherosclerosis. Endotracheal tube tip is seen approximately 3.9 cm above the carina in satisfactory position. Patchy opacity in the periphery of the right mid lung is noted. No pneumothorax nor CHF. The visualized skeletal structures are unremarkable. Surgical clips project over the epigastric region. Surgical coils noted in the right upper quadrant. Mild gastric distention with  air. No acute osseous appearing abnormality. IMPRESSION: 1. Endotracheal tube tip in satisfactory position at 3.9 cm above the carina. 2. Patchy airspace opacity in the right mid lung cannot exclude in pneumonic consolidation. 3. Aortic atherosclerosis. Electronically Signed   By: Tollie Eth M.D.   On: 11/11/2016 15:26     STUDIES:  CT head 1/17>>>neg acute  Echo 1/17>>>  CULTURES: Urine 1/17>>> BC x 2 1/17>>>  ANTIBIOTICS:   SIGNIFICANT EVENTS:   LINES/TUBES: ETT 1/17>>>  DISCUSSION: 83yo female with hx CAD, HTN presented with AMS, ?seizure, ?STEMI. Intubated in ER.  STEMI cancelled by cards, no plans for cath or intervention.   ASSESSMENT / PLAN:  PULMONARY Acute respiratory failure  - in setting AMS, ?seizure +/- STEMI P:   Vent support - 8cc/kg  F/u CXR  F/u ABG PRN BD   CARDIOVASCULAR STEMI  Hx CAD  Hx HTN P:  Seen briefly by cards, STEMI cancelled  Heparin gtt  2D echo pending  Trend lactate  Trend troponin  Consider empiric heparin for ?ACS  NEUROLOGIC AMS - ??seizure.  "shaking movements" and rightward gaze per family.  Head CT neg acute changes  P:   RASS goal: -1 Consider neurology input  Will ask neuro to assess   RENAL AKI  P:   Gentle volume  F/u chem    GASTROINTESTINAL No active issue  P:   PPI  NPO   HEMATOLOGIC Labs pending  P:  SQ heparin   INFECTIOUS Aspiration PNA  P:   Cultures sent in ER  Monitor wbc Unasyn for aspiration  ENDOCRINE No active issue  P:   Monitor glucose on chem    FAMILY  - Updates:  Family updated at length by Dr. Molli Knock.  Family would not want "heroic measures" and feel that she would not have wanted intubation.  Now that she is intubated, they would like to continue for a few days and see how she does.  However, no escalation of care, full DNR. Consider transition to comfort care in next day or so if no improvement.     Dirk Dress, NP 11/12/2016  4:16 PM Pager: [phone] or [phone]  Attending Note:  83 year old female with extensive PMH to include MI, CVA and deterioration from a physical standpoint who was found unresponsive on the day of presentation who had a seizure (likely anniversary seizure) and AMS was called.  Patient was found to be unable to protect her airway and was intubated by EDP and PCCM was called to admit.  Patient is completely unresponsive on exam.  Intubated.  Minimal gag reflex.  I reviewed CXR myself.  ETT in good position.  Code STEMI was called but patient is not a candidate for interventions given overall functional status.  Code stroke was called as well.  Neurology to see patient tomorrow.  CT of the head ordered without bleed.  Trop of >65,  will start heparin.  Will admit to the ICU for a couple of days until family all arrives and will likely extubate with no intention to reintubate.  Otherwise, DNR status.  Unasyn for aspiration PNA.  The patient is critically ill with multiple organ systems failure and requires high complexity decision making for assessment and support, frequent evaluation and titration of therapies, application of advanced monitoring technologies and extensive interpretation of multiple databases.   Critical Care Time devoted to patient care services described in this note is  35  Minutes. This time reflects time of  care of this signee Dr Koren BoundWesam Yacoub. This critical care time does not reflect procedure time, or teaching time or supervisory time of PA/NP/Med student/Med Resident etc but could involve care discussion time.  Alyson ReedyWesam G. Yacoub, M.D. [facility]. Pager: [phone]. After hours pager: [phone].

## 2016-11-03 NOTE — Progress Notes (Addendum)
Pharmacy Antibiotic Note  Sheila Murphy is a 83 y.o. female admitted on 10/31/2016 with pneumonia.  Pharmacy has been consulted for ceftriaxone dosing. Patient has unknown reaction to penicillins but has tolerated cephalosporins in the past.  Plan: Ceftriaxone 2g IV q24h Monitor culture data, renal function and clinical course  Height: 5\' 4"  (162.6 cm) IBW/kg (Calculated) : 54.7  Temp (24hrs), Avg:99.7 F (37.6 C), Min:99.7 F (37.6 C), Max:99.7 F (37.6 C)   Recent Labs Lab 11/10/2016 1505 11/07/2016 1536  CREATININE 2.76* 2.70*  LATICACIDVEN  --  3.23*    CrCl cannot be calculated (Unknown ideal weight.).    Allergies  Allergen Reactions  . Grapefruit Concentrate Other (See Comments)    Reaction unknown  . Penicillins Other (See Comments)    Has patient had a PCN reaction causing immediate rash, facial/tongue/throat swelling, SOB or lightheadedness with hypotension: Unk Has patient had a PCN reaction causing severe rash involving mucus membranes or skin necrosis: Unk Has patient had a PCN reaction that required hospitalization: Unk Has patient had a PCN reaction occurring within the last 10 years: No If all of the above answers are "NO", then may proceed with Cephalosporin use.      Arlean Hoppingorey M. Newman PiesBall, PharmD, BCPS Clinical Pharmacist Pager [phone] 11/10/2016 4:32 PM

## 2016-11-03 NOTE — Progress Notes (Signed)
ANTICOAGULATION CONSULT NOTE - Initial Consult  Pharmacy Consult for Heparin Indication: chest pain/ACS  Allergies  Allergen Reactions  . Grapefruit Concentrate Other (See Comments)    Reaction unknown  . Penicillins Other (See Comments)    Has patient had a PCN reaction causing immediate rash, facial/tongue/throat swelling, SOB or lightheadedness with hypotension: Unk Has patient had a PCN reaction causing severe rash involving mucus membranes or skin necrosis: Unk Has patient had a PCN reaction that required hospitalization: Unk Has patient had a PCN reaction occurring within the last 10 years: No If all of the above answers are "NO", then may proceed with Cephalosporin use.     Patient Measurements: Height: 5\' 4"  (162.6 cm) IBW/kg (Calculated) : 54.7  Vital Signs: Temp: 99.7 F (37.6 C) (01/17 1546) Temp Source: Rectal (01/17 1546) BP: 115/67 (01/17 1545) Pulse Rate: 84 (01/17 1545)  Labs:  Recent Labs  10/25/2016 1505 11/01/2016 1536  HGB  --  11.9*  HCT  --  35.0*  APTT 27  --   LABPROT 13.7  --   INR 1.05  --   CREATININE 2.76* 2.70*  TROPONINI >65.00*  --     CrCl cannot be calculated (Unknown ideal weight.).   Medical History: Past Medical History:  Diagnosis Date  . Aortic valve sclerosis 2004   Echo showed normal LV function, some mild diastolic dysfunction. Aortic sclerosis but no stenosis.  Marland Kitchen. CAD (coronary artery disease)   . Carotid artery disease without cerebral infarction [facility](HCC)    Right internal carotid: 70-90%, left 60-70%; asymptomatic --> moderately increased RICA velocities on 01/2014 dopplers.  . Dyslipidemia    On Mevacor - followed by PCP  . Glaucoma   . Hiatal hernia   . History of heart attack 1960s   No WMA on Echo  . HTN (hypertension)   . Myocardial infarction     Medications:   (Not in a hospital admission) Scheduled:  . aspirin  324 mg Oral NOW   Or  . aspirin  300 mg Rectal NOW  . famotidine  20 mg Per Tube BID  .  midazolam      . midazolam       Infusions:  . sodium chloride    . sodium chloride    . sodium chloride    . propofol      Assessment: 83yo female presents with respiratory failure. Pharmacy is consulted to dose heparin for ACS/chest pain.   Goal of Therapy:  Heparin level 0.3-0.7 units/ml Monitor platelets by anticoagulation protocol: Yes   Plan:  Give 2500 units bolus x 1 Start heparin infusion at 550 units/hr Check anti-Xa level in 8 hours and daily while on heparin Continue to monitor H&H and platelets  Arlean Hoppingorey M. Newman PiesBall, PharmD, BCPS Clinical Pharmacist Pager [phone] 11/17/2016,4:25 PM

## 2016-11-03 NOTE — ED Provider Notes (Signed)
[facility]-EMERGENCY DEPT Provider Note   CSN: [id number] Arrival date & time: 10/18/2016  1445     History   Chief Complaint Chief Complaint  Patient presents with  . Code STEMI    HPI Sheila Murphy is a 83 y.o. female.  HPI  PT comes in with cc of unresponsive.  Pt has hx of AV sclerosis, carotid stenosis and CAD. LEVEL 5 CAVEAT FOR ALTERED MENTAL STATUS. Pt was last seen normal around 8-9 am. EMS was called as the son saw patient unresponsive, and with spastic movement of the upper body. Pt arrived to the ER as a code STEMI, but the STEMI was deactivated at arrival by Dr. Herbie Baltimore. Pt is noted to have her face/mouth shaking and R sided gaze. The right sided of the face is mostly shaking. Spoke with patient's family over the phone about code status - pt is full code for now. Son is at the bedside.  Past Medical History:  Diagnosis Date  . Aortic valve sclerosis 2004   Echo showed normal LV function, some mild diastolic dysfunction. Aortic sclerosis but no stenosis.  Marland Kitchen CAD (coronary artery disease)   . Carotid artery disease without cerebral infarction [facility])    Right internal carotid: 70-90%, left 60-70%; asymptomatic --> moderately increased RICA velocities on 01/2014 dopplers.  . Dyslipidemia    On Mevacor - followed by PCP  . Glaucoma   . Hiatal hernia   . History of heart attack 1960s   No WMA on Echo  . HTN (hypertension)   . Myocardial infarction     Patient Active Problem List   Diagnosis Date Noted  . Acute respiratory failure with hypoxemia (HCC) 10/29/2016  . Carotid stenosis 03/07/2015  . Bleeding gastrointestinal   . Protein-calorie malnutrition, severe (HCC) 12/06/2014  . GIB (gastrointestinal bleeding) 12/05/2014  . Acute blood loss anemia 12/05/2014  . Syncope due to orthostatic hypotension 12/05/2014  . Hypothyroidism 12/05/2014  . Abnormal urinalysis 12/05/2014  . Hypokalemia 12/05/2014  . Carotid artery disease without cerebral infarction (HCC)   .  Essential hypertension   . Dyslipidemia   . Aortic valve sclerosis     Past Surgical History:  Procedure Laterality Date  . APPENDECTOMY    . CAROTID DOPPLER  2011; 2015   RICA 70-99% diameter reduction, LICA 50-69% diameter reduction, right RCA significantly elevated velocities;; moderately increased when compared to 2011.  . ESOPHAGOGASTRODUODENOSCOPY N/A 12/06/2014   Procedure: ESOPHAGOGASTRODUODENOSCOPY (EGD);  Surgeon: Vertell Novak., MD;  Location: [facility] ENDOSCOPY;  Service: Endoscopy;  Laterality: N/A;  . ESOPHAGOGASTRODUODENOSCOPY N/A 12/09/2014   Procedure: ESOPHAGOGASTRODUODENOSCOPY (EGD);  Surgeon: Willis Modena, MD;  Location: [facility] ENDOSCOPY;  Service: Endoscopy;  Laterality: N/A;  . HIATAL HERNIA REPAIR    . RADIOLOGY WITH ANESTHESIA N/A 12/09/2014   Procedure: RADIOLOGY WITH ANESTHESIA;  Surgeon: Medication Radiologist, MD;  Location: [facility] OR;  Service: Radiology;  Laterality: N/A;  . TRANSTHORACIC ECHOCARDIOGRAM  2004   LV EF normal, LA mildly dilated, arial septum is aneurysmal, mild aortic sclerosis, mild mitral annular calcification, mild MR, mild TR, RVSP 40-26mmHg (mod pulm HTN)    OB History    No data available       Home Medications    Prior to Admission medications   Medication Sig Start Date End Date Taking? Authorizing Provider  Aspirin-Acetaminophen-Caffeine (EXCEDRIN PO) Take 1 tablet by mouth every 6 (six) hours as needed (as needed for headache).   Yes Historical Provider, MD  amLODipine (NORVASC) 10 MG tablet Take 1  tablet (10 mg total) by mouth daily. Patient not taking: Reported on 06/04/2016 07/01/15   Marykay Lexavid W Harding, MD  bumetanide (BUMEX) 0.5 MG tablet Take 0.5 mg by mouth daily.    Historical Provider, MD  CALCIUM PO Take by mouth.    Historical Provider, MD  chlorthalidone (HYGROTON) 25 MG tablet Take 1 tablet (25 mg total) by mouth daily. 06/17/15   Marykay Lexavid W Harding, MD  cholecalciferol (VITAMIN D) 1000 UNITS tablet Take 1,000 Units by mouth daily.     Historical Provider, MD  furosemide (LASIX) 40 MG tablet Take 40 mg by mouth daily.    Historical Provider, MD  levothyroxine (SYNTHROID, LEVOTHROID) 25 MCG tablet Take 50 mcg by mouth daily before breakfast.     Historical Provider, MD  lisinopril (PRINIVIL,ZESTRIL) 40 MG tablet Take 40 mg by mouth daily.    Historical Provider, MD  lisinopril (PRINIVIL,ZESTRIL) 5 MG tablet Take 5 mg by mouth daily.    Historical Provider, MD  lovastatin (MEVACOR) 20 MG tablet Take 20 mg by mouth daily.     Historical Provider, MD  Multiple Vitamin (MULTIVITAMIN) tablet Take 1 tablet by mouth daily.    Historical Provider, MD  ondansetron (ZOFRAN ODT) 4 MG disintegrating tablet 4mg  ODT q4 hours prn nausea/vomit 06/04/16   Bethann BerkshireJoseph Zammit, MD  pantoprazole (PROTONIX) 40 MG tablet Take 1 tablet (40 mg total) by mouth 2 (two) times daily before a meal. Patient not taking: Reported on 07/01/2015 12/15/14   Esperanza SheetsUlugbek N Buriev, MD  polyethylene glycol powder (GLYCOLAX/MIRALAX) powder Take 17 g by mouth See admin instructions. Mix into 8 ounces of fluid of choice and drunk once a day 01/15/15   Historical Provider, MD  potassium chloride (K-DUR) 10 MEQ tablet Take 10 mEq by mouth 2 (two) times daily. Take 2 tablet in the AM and 1 tablet in the PM    Historical Provider, MD  potassium chloride (K-DUR,KLOR-CON) 10 MEQ tablet Take 20 mEq by mouth daily.    Historical Provider, MD  vitamin C (ASCORBIC ACID) 500 MG tablet Take 500 mg by mouth daily.    Historical Provider, MD  vitamin E 400 UNIT capsule Take 400 Units by mouth daily.    Historical Provider, MD    Family History Family History  Problem Relation Age of Onset  . Heart attack Father   . Other      Noncontributory, deferred due to age  . Hyperlipidemia Daughter   . Hypertension Daughter   . Diabetes Son   . Heart disease Son   . Hyperlipidemia Son   . Kidney disease Son   . Heart attack Son     Social History Social History  Substance Use Topics  .  Smoking status: Former Smoker    Quit date: 03/07/1975  . Smokeless tobacco: Not on file  . Alcohol use No     Allergies   Grapefruit concentrate and Penicillins   Review of Systems Review of Systems  Unable to perform ROS: Mental status change     Physical Exam Updated Vital Signs BP 115/67   Pulse 84   Temp 99.7 F (37.6 C) (Rectal)   Resp 15   Ht 5\' 4"  (1.626 m)   SpO2 100%   Physical Exam  Constitutional: She appears well-developed.  HENT:  Head: Normocephalic and atraumatic.  Eyes:  R sided gaze  Neck: Normal range of motion. Neck supple.  Cardiovascular:  tachycardia  Pulmonary/Chest: Effort normal.  Abdominal: Bowel sounds are normal.  Neurological:  GCS - 3  Skin: Skin is warm and dry.  Nursing note and vitals reviewed.    ED Treatments / Results  Labs (all labs ordered are listed, but only abnormal results are displayed) Labs Reviewed  URINALYSIS, ROUTINE W REFLEX MICROSCOPIC - Abnormal; Notable for the following:       Result Value   Hgb urine dipstick MODERATE (*)    Protein, ur 100 (*)    Bacteria, UA RARE (*)    Squamous Epithelial / LPF 0-5 (*)    All other components within normal limits  I-STAT CHEM 8, ED - Abnormal; Notable for the following:    Chloride 114 (*)    BUN 48 (*)    Creatinine, Ser 2.70 (*)    Glucose, Bld 187 (*)    Hemoglobin 11.9 (*)    HCT 35.0 (*)    All other components within normal limits  I-STAT CG4 LACTIC ACID, ED - Abnormal; Notable for the following:    Lactic Acid, Venous 3.23 (*)    All other components within normal limits  URINE CULTURE  CULTURE, BLOOD (ROUTINE X 2)  CULTURE, BLOOD (ROUTINE X 2)  APTT  PROTIME-INR  TRIGLYCERIDES  COMPREHENSIVE METABOLIC PANEL  MAGNESIUM  TROPONIN I  TROPONIN I  TROPONIN I  INFLUENZA PANEL BY PCR (TYPE A & B)  BLOOD GAS, ARTERIAL  CBC  CREATININE, SERUM    EKG  EKG Interpretation  Date/Time:  Wednesday 11/15/16 15:01:01 EST Ventricular Rate:   109 PR Interval:    QRS Duration: 70 QT Interval:  312 QTC Calculation: 421 R Axis:   31 Text Interpretation:  Sinus tachycardia LAE, consider biatrial enlargement Anterior infarct, acute (LAD) ST elevation in anterior leads anterior q waves Confirmed by Rhunette Croft, MD, Janey Genta [phone]) on 11-15-2016 4:20:31 PM       Radiology Ct Head Wo Contrast  Result Date: 11/15/16 CLINICAL DATA:  Patient on ventilator.  Unresponsive. EXAM: CT HEAD WITHOUT CONTRAST TECHNIQUE: Contiguous axial images were obtained from the base of the skull through the vertex without intravenous contrast. COMPARISON:  11/24/2011. FINDINGS: Brain: No evidence for acute infarction, hemorrhage, mass lesion, hydrocephalus, or extra-axial fluid. Global atrophy. Hypoattenuation of white matter representing chronic microvascular ischemic change. Vascular: Advanced vascular calcification in the carotid siphons. Skull: No fracture or worrisome osseous lesion. Sinuses/Orbits: Minor layering fluid in the sphenoid, ethmoid, and maxillary sinuses. No significant sinus opacity. No acute orbital findings. Other: Marked calcific pannus surrounds the odontoid. No definite cervicomedullary compression. IMPRESSION: Atrophy and small vessel disease, similar to prior MR. No acute intracranial findings. Minor layering fluid in the sinuses could be secondary to recumbent see. Advanced vascular calcification.  No proximal hyperdense vessel. Electronically Signed   By: Elsie Stain M.D.   On: 11-15-2016 15:39   Dg Chest Portable 1 View  Result Date: 11/15/2016 CLINICAL DATA:  Respiratory failure, endotracheal tube position EXAM: PORTABLE CHEST 1 VIEW COMPARISON:  06/04/2016 FINDINGS: The heart size and mediastinal contours are within normal limits. Mild aortic atherosclerosis. Endotracheal tube tip is seen approximately 3.9 cm above the carina in satisfactory position. Patchy opacity in the periphery of the right mid lung is noted. No pneumothorax nor  CHF. The visualized skeletal structures are unremarkable. Surgical clips project over the epigastric region. Surgical coils noted in the right upper quadrant. Mild gastric distention with air. No acute osseous appearing abnormality. IMPRESSION: 1. Endotracheal tube tip in satisfactory position at 3.9 cm above the carina. 2. Patchy airspace opacity in the  right mid lung cannot exclude in pneumonic consolidation. 3. Aortic atherosclerosis. Electronically Signed   By: Tollie Eth M.D.   On: November 25, 2016 15:26    Procedures Procedures (including critical care time)  INTUBATION Performed by: Derwood Kaplan  Required items: required blood products, implants, devices, and special equipment available Patient identity confirmed: provided demographic data and hospital-assigned identification number Time out: Immediately prior to procedure a "time out" was called to verify the correct patient, procedure, equipment, support staff and site/side marked as required.  Indications:     Intubation method:  Direct Laryngoscopy Preoxygenation: BVM  Sedatives: Etomidate   Tube Size: 7 cuffed  Post-procedure assessment: chest rise and ETCO2 monitor Breath sounds: equal and absent over the epigastrium Tube secured with: ETT holder Chest x-ray interpreted by radiologist and me.  Chest x-ray findings: endotracheal tube in appropriate position  Patient tolerated the procedure well with no immediate complications.   CRITICAL CARE Performed by: Derwood Kaplan   Total critical care time: 40 minutes  Critical care time was exclusive of separately billable procedures and treating other patients.  Critical care was necessary to treat or prevent imminent or life-threatening deterioration.  Critical care was time spent personally by me on the following activities: development of treatment plan with patient and/or surrogate as well as nursing, discussions with consultants, evaluation of patient's response  to treatment, examination of patient, obtaining history from patient or surrogate, ordering and performing treatments and interventions, ordering and review of laboratory studies, ordering and review of radiographic studies, pulse oximetry and re-evaluation of patient's condition.   Medications Ordered in ED Medications  midazolam (VERSED) 2 MG/2ML injection (not administered)  midazolam (VERSED) 2 MG/2ML injection (not administered)  0.9 %  sodium chloride infusion (not administered)  propofol (DIPRIVAN) 1000 MG/100ML infusion (20 mcg/kg/min Intravenous Rate/Dose Change 11/25/16 1615)  fentaNYL (SUBLIMAZE) injection 50 mcg (not administered)  fentaNYL (SUBLIMAZE) bolus via infusion 25 mcg (not administered)  midazolam (VERSED) injection 1 mg (not administered)  midazolam (VERSED) injection 1 mg (not administered)  propofol (DIPRIVAN) 1000 MG/100ML infusion (not administered)  0.9 %  sodium chloride infusion (not administered)  aspirin chewable tablet 324 mg (not administered)    Or  aspirin suppository 300 mg (not administered)  heparin injection 5,000 Units (not administered)  famotidine (PEPCID) 40 MG/5ML suspension 20 mg (not administered)  0.9 %  sodium chloride infusion (not administered)  acetaminophen (TYLENOL) tablet 650 mg (not administered)  ondansetron (ZOFRAN) injection 4 mg (not administered)  cefTRIAXone (ROCEPHIN) 1 g in dextrose 5 % 50 mL IVPB (not administered)  doxycycline (VIBRAMYCIN) 100 mg in dextrose 5 % 250 mL IVPB (not administered)  midazolam (VERSED) injection 2 mg (2 mg Intravenous Given 2016-11-25 1459)  midazolam (VERSED) injection 2 mg (2 mg Intravenous Given 11-25-2016 1500)  etomidate (AMIDATE) injection (15 mg Intravenous Given 25-Nov-2016 1505)     Initial Impression / Assessment and Plan / ED Course  I have reviewed the triage vital signs and the nursing notes.  Pertinent labs & imaging results that were available during my care of the patient were reviewed  by me and considered in my medical decision making (see chart for details).  Clinical Course     Pt comes in unresponsive. She appears to be having partial seizures and has elevated BP. Called family to get code status - and we were told that pt is full code and to intibate by daughter and the son - the latter has POA.  Pt given versed 2 mg  iv and then 1 mg iv while we were figuring out the code status. There was no complete resolution of the twitching.  Initial impression is that pt has status epilepticus, brain bleed or strokes. ACS and dissection possible.  Post intubation family updated. Pt also noted to have rhonchi - CAP and other infection considered. CCM and Neuro consulted.  Final Clinical Impressions(s) / ED Diagnoses   Final diagnoses:  Unresponsive  Status epilepticus [facility])    New Prescriptions New Prescriptions   No medications on file     Derwood Kaplan, MD 10/23/2016 1629

## 2016-11-03 NOTE — ED Triage Notes (Addendum)
Per ems- pt comes home family last talked with her at 10 am, son found her unresponsive, STEMI on monitor. BP 200 systolic. Initial right sided gaze, with shaking bilateral movement.

## 2016-11-04 ENCOUNTER — Inpatient Hospital Stay (HOSPITAL_COMMUNITY): Payer: Medicare HMO

## 2016-11-04 ENCOUNTER — Ambulatory Visit (HOSPITAL_COMMUNITY): Admit: 2016-11-04 | Payer: Commercial Managed Care - HMO | Admitting: Cardiology

## 2016-11-04 ENCOUNTER — Encounter (HOSPITAL_COMMUNITY): Payer: Self-pay

## 2016-11-04 DIAGNOSIS — R400 Somnolence: Secondary | ICD-10-CM

## 2016-11-04 DIAGNOSIS — R4189 Other symptoms and signs involving cognitive functions and awareness: Secondary | ICD-10-CM

## 2016-11-04 DIAGNOSIS — Z9289 Personal history of other medical treatment: Secondary | ICD-10-CM

## 2016-11-04 DIAGNOSIS — I2101 ST elevation (STEMI) myocardial infarction involving left main coronary artery: Secondary | ICD-10-CM

## 2016-11-04 DIAGNOSIS — R4182 Altered mental status, unspecified: Secondary | ICD-10-CM

## 2016-11-04 LAB — BLOOD CULTURE ID PANEL (REFLEXED)
Acinetobacter baumannii: NOT DETECTED
CANDIDA KRUSEI: NOT DETECTED
CANDIDA PARAPSILOSIS: NOT DETECTED
Candida albicans: NOT DETECTED
Candida glabrata: NOT DETECTED
Candida tropicalis: NOT DETECTED
ENTEROCOCCUS SPECIES: NOT DETECTED
ESCHERICHIA COLI: NOT DETECTED
Enterobacter cloacae complex: NOT DETECTED
Enterobacteriaceae species: NOT DETECTED
HAEMOPHILUS INFLUENZAE: NOT DETECTED
KLEBSIELLA OXYTOCA: NOT DETECTED
Klebsiella pneumoniae: NOT DETECTED
LISTERIA MONOCYTOGENES: NOT DETECTED
METHICILLIN RESISTANCE: DETECTED — AB
Neisseria meningitidis: NOT DETECTED
Proteus species: NOT DETECTED
Pseudomonas aeruginosa: NOT DETECTED
SERRATIA MARCESCENS: NOT DETECTED
STAPHYLOCOCCUS AUREUS BCID: NOT DETECTED
Staphylococcus species: DETECTED — AB
Streptococcus agalactiae: NOT DETECTED
Streptococcus pneumoniae: NOT DETECTED
Streptococcus pyogenes: NOT DETECTED
Streptococcus species: NOT DETECTED

## 2016-11-04 LAB — BASIC METABOLIC PANEL
ANION GAP: 14 (ref 5–15)
BUN: 45 mg/dL — ABNORMAL HIGH (ref 6–20)
CALCIUM: 8.8 mg/dL — AB (ref 8.9–10.3)
CO2: 17 mmol/L — AB (ref 22–32)
Chloride: 117 mmol/L — ABNORMAL HIGH (ref 101–111)
Creatinine, Ser: 2.46 mg/dL — ABNORMAL HIGH (ref 0.44–1.00)
GFR calc non Af Amer: 16 mL/min — ABNORMAL LOW (ref >60)
GFR, EST AFRICAN AMERICAN: 19 mL/min — AB (ref >60)
Glucose, Bld: 107 mg/dL — ABNORMAL HIGH (ref 65–99)
POTASSIUM: 3.4 mmol/L — AB (ref 3.5–5.1)
Sodium: 148 mmol/L — ABNORMAL HIGH (ref 135–145)

## 2016-11-04 LAB — POCT I-STAT 3, ART BLOOD GAS (G3+)
ACID-BASE DEFICIT: 7 mmol/L — AB (ref 0.0–2.0)
BICARBONATE: 15.8 mmol/L — AB (ref 20.0–28.0)
O2 SAT: 100 %
PO2 ART: 197 mmHg — AB (ref 83.0–108.0)
TCO2: 16 mmol/L (ref 0–100)
pCO2 arterial: 24.5 mmHg — ABNORMAL LOW (ref 32.0–48.0)
pH, Arterial: 7.42 (ref 7.350–7.450)

## 2016-11-04 LAB — BLOOD GAS, ARTERIAL
Acid-base deficit: 7.1 mmol/L — ABNORMAL HIGH (ref 0.0–2.0)
BICARBONATE: 15.4 mmol/L — AB (ref 20.0–28.0)
Drawn by: 270271
FIO2: 40
LHR: 18 {breaths}/min
O2 Saturation: 97.5 %
PEEP: 5 cmH2O
Patient temperature: 98.6
VT: 440 mL
pCO2 arterial: 19.3 mmHg — CL (ref 32.0–48.0)
pH, Arterial: 7.514 — ABNORMAL HIGH (ref 7.350–7.450)
pO2, Arterial: 183 mmHg — ABNORMAL HIGH (ref 83.0–108.0)

## 2016-11-04 LAB — INFLUENZA PANEL BY PCR (TYPE A & B)
INFLBPCR: NEGATIVE
Influenza A By PCR: NEGATIVE

## 2016-11-04 LAB — GLUCOSE, CAPILLARY
GLUCOSE-CAPILLARY: 107 mg/dL — AB (ref 65–99)
Glucose-Capillary: 93 mg/dL (ref 65–99)

## 2016-11-04 LAB — CBC
HEMATOCRIT: 30.5 % — AB (ref 36.0–46.0)
HEMOGLOBIN: 10.1 g/dL — AB (ref 12.0–15.0)
MCH: 31.2 pg (ref 26.0–34.0)
MCHC: 33.1 g/dL (ref 30.0–36.0)
MCV: 94.1 fL (ref 78.0–100.0)
Platelets: 204 10*3/uL (ref 150–400)
RBC: 3.24 MIL/uL — AB (ref 3.87–5.11)
RDW: 13.5 % (ref 11.5–15.5)
WBC: 14.4 10*3/uL — AB (ref 4.0–10.5)

## 2016-11-04 LAB — URINE CULTURE: Culture: NO GROWTH

## 2016-11-04 LAB — PHOSPHORUS: PHOSPHORUS: 2.9 mg/dL (ref 2.5–4.6)

## 2016-11-04 LAB — HEPARIN LEVEL (UNFRACTIONATED): HEPARIN UNFRACTIONATED: 0.91 [IU]/mL — AB (ref 0.30–0.70)

## 2016-11-04 LAB — MAGNESIUM: Magnesium: 1.7 mg/dL (ref 1.7–2.4)

## 2016-11-04 SURGERY — LEFT HEART CATH AND CORONARY ANGIOGRAPHY

## 2016-11-04 MED ORDER — POTASSIUM CHLORIDE 20 MEQ/15ML (10%) PO SOLN
20.0000 meq | Freq: Once | ORAL | Status: AC
  Administered 2016-11-04: 20 meq
  Filled 2016-11-04: qty 15

## 2016-11-04 MED ORDER — FUROSEMIDE 10 MG/ML IJ SOLN
40.0000 mg | Freq: Two times a day (BID) | INTRAMUSCULAR | Status: DC
  Administered 2016-11-04: 40 mg via INTRAVENOUS
  Filled 2016-11-04: qty 4

## 2016-11-04 MED ORDER — SODIUM CHLORIDE 0.45 % IV SOLN
INTRAVENOUS | Status: DC
  Administered 2016-11-04: 04:00:00 via INTRAVENOUS

## 2016-11-04 MED ORDER — MORPHINE BOLUS VIA INFUSION
5.0000 mg | INTRAVENOUS | Status: DC | PRN
  Administered 2016-11-04: 5 mg via INTRAVENOUS
  Filled 2016-11-04: qty 20

## 2016-11-04 MED ORDER — MORPHINE SULFATE 50 MG/ML IV SOLN
10.0000 mg/h | INTRAVENOUS | Status: DC
  Administered 2016-11-04: 10 mg/h via INTRAVENOUS
  Administered 2016-11-05: 15 mg/h via INTRAVENOUS
  Filled 2016-11-04: qty 10
  Filled 2016-11-04 (×3): qty 5

## 2016-11-04 MED ORDER — ASPIRIN 81 MG PO CHEW
81.0000 mg | CHEWABLE_TABLET | Freq: Every day | ORAL | Status: DC
  Administered 2016-11-04: 81 mg
  Filled 2016-11-04: qty 1

## 2016-11-04 MED ORDER — FAMOTIDINE 40 MG/5ML PO SUSR: 20.0000 mg | Freq: Every day | ORAL | Status: DC

## 2016-11-04 NOTE — Progress Notes (Signed)
ANTICOAGULATION CONSULT NOTE - Follow Up Consult  Pharmacy Consult for heparin Indication: STEMI  Labs:  Recent Labs  11/22/16 1505  11/22/16 1536 11/22/16 1756 11/22/16 2103 11/04/16 0209  HGB  --   < > 11.9* 11.7*  --  10.1*  HCT  --   --  35.0* 35.8*  --  30.5*  PLT  --   --   --  222  --  204  APTT 27  --   --   --   --   --   LABPROT 13.7  --   --   --   --   --   INR 1.05  --   --   --   --   --   HEPARINUNFRC  --   --   --   --   --  0.91*  CREATININE 2.76*  --  2.70* 2.22*  --  2.46*  TROPONINI >65.00*  --   --  >65.00* 61.90*  --   < > = values in this interval not displayed.   Assessment: 83yo female above goal on heparin with initial dosing for STEMI.  Goal of Therapy:  Heparin level 0.3-0.7 units/ml   Plan:  Will decrease heparin gtt by 2 units/kg/hr to 450 units/hr and check level in 8hr.  Vernard GamblesVeronda Adoria Kawamoto, PharmD, BCPS  11/04/2016,3:01 AM

## 2016-11-04 NOTE — Progress Notes (Addendum)
PULMONARY / CRITICAL CARE MEDICINE   Name: Sheila Murphy MRN: [id number] DOB: 11-29-1921    ADMISSION DATE:  06/17/2017  REFERRING MD:  EDP   CHIEF COMPLAINT:  Respiratory failure   HISTORY OF PRESENT ILLNESS:   83yo female with hx CAD, HTN presented 1/17 after her son found her unresponsive with shaking movements.  EKG was concerning for STEMI, SBP 200's.  Pt intubated in ER and PCCM called for ICU admission. Initial w/u revealed AKI, lactic acidosis, troponin >65.   Deemed not candidate for cath per cardiology.    SUBJECTIVE:  ?seizure activity overnight. EEG pending. Remains on levophed 8mcg.    VITAL SIGNS: BP (!) 150/69   Pulse 80   Temp 100.2 F (37.9 C) (Oral)   Resp 16   Ht 5\' 4"  (1.626 m)   Wt 46.8 kg (103 lb 2.8 oz)   SpO2 100%   BMI 17.71 kg/m   HEMODYNAMICS:    VENTILATOR SETTINGS: Vent Mode: PRVC FiO2 (%):  [40 %-100 %] 40 % Set Rate:  [12 bmp-18 bmp] 12 bmp Vt Set:  [440 mL-460 mL] 460 mL PEEP:  [5 cmH20] 5 cmH20 Plateau Pressure:  [14 cmH20-18 cmH20] 18 cmH20  INTAKE / OUTPUT: I/O last 3 completed shifts: In: 2368.7 [I.V.:1248.7; Other:10; NG/GT:60; IV Piggyback:1050] Out: 415 [Urine:290; Emesis/NG output:125]  PHYSICAL EXAMINATION: General:  Frail elderly female, minimally responsive on vent  Neuro:  Unresponsive, no gag, no response to pain  HEENT:  Mm moist, Ett Cardiovascular:  S1S2 RRR Lungs:  resps even non labored on vent, few scattered rhonchi Abdomen:  Soft, hypoactive bs  Musculoskeletal:  Warm and dry, no edema    LABS:  BMET  Recent Labs Lab 01-17-2017 1505 01-17-2017 1536 01-17-2017 1756 11/04/16 0209  NA 145 145  --  148*  K 4.0 4.0  --  3.4*  CL 112* 114*  --  117*  CO2 19*  --   --  17*  BUN 45* 48*  --  45*  CREATININE 2.76* 2.70* 2.22* 2.46*  GLUCOSE 191* 187*  --  107*    Electrolytes  Recent Labs Lab 01-17-2017 1505 11/04/16 0209  CALCIUM 9.8 8.8*  MG 1.9 1.7  PHOS  --  2.9    CBC  Recent Labs Lab  01-17-2017 1536 01-17-2017 1756 11/04/16 0209  WBC  --  11.7* 14.4*  HGB 11.9* 11.7* 10.1*  HCT 35.0* 35.8* 30.5*  PLT  --  222 204    Coag's  Recent Labs Lab 01-17-2017 1505  APTT 27  INR 1.05    Sepsis Markers  Recent Labs Lab 01-17-2017 1536  LATICACIDVEN 3.23*    ABG  Recent Labs Lab 01-17-2017 1757 11/04/16 0341 11/04/16 0640  PHART 7.482* 7.514* 7.420  PCO2ART 25.7* 19.3* 24.5*  PO2ART 488.0* 183* 197.0*    Liver Enzymes  Recent Labs Lab 01-17-2017 1505  AST 149*  ALT 16  ALKPHOS 52  BILITOT 0.8  ALBUMIN 3.9    Cardiac Enzymes  Recent Labs Lab 01-17-2017 1505 01-17-2017 1756 01-17-2017 2103  TROPONINI >65.00* >65.00* 61.90*    Glucose  Recent Labs Lab 01-17-2017 1719 01-17-2017 2021 01-17-2017 2309 11/04/16 0322 11/04/16 0805  GLUCAP 143* 134* 91 107* 93    Imaging Ct Head Wo Contrast  Result Date: 06/17/2017 CLINICAL DATA:  Patient on ventilator.  Unresponsive. EXAM: CT HEAD WITHOUT CONTRAST TECHNIQUE: Contiguous axial images were obtained from the base of the skull through the vertex without intravenous contrast. COMPARISON:  11/24/2011. FINDINGS:  Brain: No evidence for acute infarction, hemorrhage, mass lesion, hydrocephalus, or extra-axial fluid. Global atrophy. Hypoattenuation of white matter representing chronic microvascular ischemic change. Vascular: Advanced vascular calcification in the carotid siphons. Skull: No fracture or worrisome osseous lesion. Sinuses/Orbits: Minor layering fluid in the sphenoid, ethmoid, and maxillary sinuses. No significant sinus opacity. No acute orbital findings. Other: Marked calcific pannus surrounds the odontoid. No definite cervicomedullary compression. IMPRESSION: Atrophy and small vessel disease, similar to prior MR. No acute intracranial findings. Minor layering fluid in the sinuses could be secondary to recumbent see. Advanced vascular calcification.  No proximal hyperdense vessel. Electronically Signed   By: Elsie Stain M.D.   On: 12-02-16 15:39   Dg Chest Port 1 View  Result Date: 11/04/2016 CLINICAL DATA:  Acute respiratory failure EXAM: PORTABLE CHEST 1 VIEW COMPARISON:  2016/12/02 FINDINGS: Endotracheal tube in good position.  NG tube in the stomach. Patchy airspace disease right mid lung has improved. Left lung is clear. No effusion. IMPRESSION: Improving infiltrate right lung. Electronically Signed   By: Marlan Palau M.D.   On: 11/04/2016 06:52   Dg Chest Portable 1 View  Result Date: 12/02/16 CLINICAL DATA:  Respiratory failure, endotracheal tube position EXAM: PORTABLE CHEST 1 VIEW COMPARISON:  06/04/2016 FINDINGS: The heart size and mediastinal contours are within normal limits. Mild aortic atherosclerosis. Endotracheal tube tip is seen approximately 3.9 cm above the carina in satisfactory position. Patchy opacity in the periphery of the right mid lung is noted. No pneumothorax nor CHF. The visualized skeletal structures are unremarkable. Surgical clips project over the epigastric region. Surgical coils noted in the right upper quadrant. Mild gastric distention with air. No acute osseous appearing abnormality. IMPRESSION: 1. Endotracheal tube tip in satisfactory position at 3.9 cm above the carina. 2. Patchy airspace opacity in the right mid lung cannot exclude in pneumonic consolidation. 3. Aortic atherosclerosis. Electronically Signed   By: Tollie Eth M.D.   On: December 02, 2016 15:26   Dg Abd Portable 1v  Result Date: 2016-12-02 CLINICAL DATA:  OG tube placement EXAM: PORTABLE ABDOMEN - 1 VIEW COMPARISON:  06/04/2016, 06/03/2016 FINDINGS: Lung bases are grossly clear. Surgical clips at the GE junction. Esophageal tube tip projects over the central abdomen, presumably over the mid stomach. Vascular coils in the right upper quadrant. Nonobstructed gas pattern IMPRESSION: Esophageal tube tip overlies the mid abdomen, presumably over the mid stomach Electronically Signed   By: Jasmine Pang M.D.   On:  December 02, 2016 18:39     STUDIES:  CT head 1/17>>>neg acute  Echo 1/17>>>  CULTURES: Urine 1/17>>> BC x 2 1/17>>> GNR, GPC>>>  ANTIBIOTICS:   SIGNIFICANT EVENTS:   LINES/TUBES: ETT 1/17>>>  DISCUSSION: 83yo female with hx CAD, HTN presented with AMS, ?seizure, ?STEMI. Intubated in ER.  STEMI cancelled by cards, no plans for cath or intervention.   ASSESSMENT / PLAN:  PULMONARY Acute respiratory failure - in setting AMS, ?seizure +/- STEMI ?aspiration  P:   Vent support - 8cc/kg  F/u CXR  F/u ABG Empiric abx as above   CARDIOVASCULAR STEMI  Hx CAD  Hx HTN Shock  P:  Continue heparin gtt  Cards following  Echo pending  Trend troponin  Gentle diuresis ordered per cards  Wean pressors as able   NEUROLOGIC AMS - ??seizure.  "shaking movements" and rightward gaze per family.  Head CT neg acute changes  P:   RASS goal: -1 Neuro to see  EEG, MRI pending   RENAL AKI - improving  P:  F/u chem  Monitor renal function with diuresis    GASTROINTESTINAL No active issue  P:   PPI  NPO   HEMATOLOGIC No active issue  P:  Heparin gtt  F.u cbc   INFECTIOUS ?Aspiration PNA  P:   Follow cultures  Empiric unasyn as above  Trend wbc, fever curve    ENDOCRINE No active issue  P:   Monitor glucose on chem    FAMILY  - Updates:  Family updated at length by Dr. Molli Knock 1/17 in ER.  Family would not want "heroic measures" and feel that she would not have wanted intubation.  Now that she is intubated, they would like to continue for a few days and see how she does.  However, no escalation of care, full DNR. Consider transition to comfort care in next day or so if no improvement.     Dirk Dress, NP 11/04/2016  9:29 AM Pager: [phone] or [phone]  STAFF NOTE: I, Rory Percy, MD FACP have personally reviewed patient's available data, including medical history, events of note, physical examination and test results as part of my  evaluation. I have discussed with resident/NP and other care providers such as pharmacist, RN and RRT. In addition, I personally evaluated patient and elicited key findings of: rass -3, on prop, no myoclonus now, lungs slight coarse rt , not following commands, history concerning for anoxia at home, ST elevation lat / ant, note don ecg, NOT a candidate cath, ARF, prognosis poor for any functional rcoevrry, eeg done, echo pending,. MRI will not change outcome, maintain keppra, abg reviewed, tv down, lasix per cards, will update family and discuss option comfort care The patient is critically ill with multiple organ systems failure and requires high complexity decision making for assessment and support, frequent evaluation and titration of therapies, application of advanced monitoring technologies and extensive interpretation of multiple databases.   Critical Care Time devoted to patient care services described in this note is 30 Minutes. This time reflects time of care of this signee: Rory Percy, MD FACP. This critical care time does not reflect procedure time, or teaching time or supervisory time of PA/NP/Med student/Med Resident etc but could involve care discussion time. Rest per NP/medical resident whose note is outlined above and that I agree with   Mcarthur Rossetti. Tyson Alias, MD, FACP Pgr: [phone] [facility] 11/04/2016 11:31 AM   Extensive discussion with family children and med poa son. We discussed the poor prognosis and likely poor quality of life. Family has decided to offer full comfort care. They are aware that the patient may be transferred to palliative care floor for continued comfort care needs. They have been fully updated on the process and expectations. Son coming from [place] then comfort tonight late  Mcarthur Rossetti. Tyson Alias, MD, FACP Pgr: [phone]  Pulmonary & Critical Care

## 2016-11-04 NOTE — Progress Notes (Signed)
Initial Nutrition Assessment  DOCUMENTATION CODES:   Underweight  INTERVENTION:    If TF deemed within goals of care, initiate TF via OGT with Vital AF 1.2 at goal rate of 45 ml/h (1080 ml per day) to provide 1296 kcal, 81 gm protein, 876 ml free water daily.  NUTRITION DIAGNOSIS:   Inadequate oral intake related to inability to eat as evidenced by NPO status.  GOAL:   Patient will meet greater than or equal to 90% of their needs  MONITOR:   Vent status, Labs, I & O's  REASON FOR ASSESSMENT:   Consult  (TF recommendations)  ASSESSMENT:   83 yo female with hx CAD, HTN presented 1/17 after her son found her unresponsive with shaking movements.  EKG was concerning for STEMI, SBP 200's.  Pt intubated in ER. Initial w/u revealed AKI, lactic acidosis, troponin >65.   Deemed not candidate for cath per cardiology.   Received MD Consult for TF recommendations. May transition to comfort care in the next few days if no improvement. Unable to complete nutrition focused physical exam at this time. Patient is currently intubated on ventilator support MV: 10.4 L/min Temp (24hrs), Avg:99.1 F (37.3 C), Min:97.4 F (36.3 C), Max:100.2 F (37.9 C)  Propofol: 2.7 ml/hr providing 71 kcal per day Labs reviewed sodium 148, potassium 3.4 Medications reviewed and include Lasix, Propofol.  Diet Order:  Diet NPO time specified  Skin:  Reviewed, no issues  Last BM:  PTA  Height:   Ht Readings from Last 1 Encounters:  12/12/16 5\' 4"  (1.626 m)    Weight:   Wt Readings from Last 1 Encounters:  11/04/16 103 lb 2.8 oz (46.8 kg)    Ideal Body Weight:  54.5 kg  BMI:  Body mass index is 17.71 kg/m.  Estimated Nutritional Needs:   Kcal:  1275  Protein:  65-75 gm  Fluid:  1.4 L  EDUCATION NEEDS:   No education needs identified at this time  Joaquin CourtsKimberly Harris, RD, LDN, CNSC Pager [phone] After Hours Pager [phone]

## 2016-11-04 NOTE — Procedures (Signed)
ELECTROENCEPHALOGRAM REPORT  Date of Study: 11/04/2016  Patient's Name: Sheila Murphy MRN: [id number] Date of Birth: [birth year]  Referring Provider: Dr. Koren BoundWesam Yacoub  Clinical History: This is a 83 year old woman found unresponsive with shaking movements.   Medications: levETIRAcetam (KEPPRA) 500 mg in sodium chloride 0.9 % 100 mL IVPB  propofol (DIPRIVAN) 1000 MG/100ML infusion  acetaminophen (TYLENOL) tablet 650 mg  aspirin chewable tablet 81 mg  cefTRIAXone (ROCEPHIN) 2 g in dextrose 5 % 50 mL IVPB  famotidine (PEPCID) 40 MG/5ML suspension 20 mg  furosemide (LASIX) injection 40 mg  insulin aspart (novoLOG) injection 2-6 Units  ondansetron (ZOFRAN) injection 4 mg   Technical Summary: A multichannel digital EEG recording measured by the international 10-20 system with electrodes applied with paste and impedances below 5000 ohms performed in our laboratory with EKG monitoring in an intubated and sedated patient.  Hyperventilation and photic stimulation were not performed.  The digital EEG was referentially recorded, reformatted, and digitally filtered in a variety of bipolar and referential montages for optimal display.    Description: The patient is intubated and sedated on Propofol during the recording. There is loss of normal background activity. The background consists of diffuse slowing with lateralized periodic discharges over the left hemisphere intermixed with beta and theta activity, waxing and waning in frequency and amplitude in bursts and runs lasting 5 to 50 seconds. These runs are followed by diffuse background suppression lasting 3 to 5 seconds. There are jaw jerks seen associated with some of these bursts and runs. Hyperventilation and photic stimulation were not performed.Normal sleep architecture was not seen.   EKG lead was unremarkable.  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 There are bursts and runs of epileptiform discharges that evolve in frequency and amplitude followed by brief diffuse post-ictal background suppression with associated jaw jerks with some of the seizures.  Clinical Correlation of the above findings indicates a definite tendency for seizures to arise from the left hemisphere, with status epilepticus captured on this 30 minute study.    Patrcia DollyKaren Symir Mah, M.D.

## 2016-11-04 NOTE — Progress Notes (Signed)
PHARMACY - PHYSICIAN COMMUNICATION CRITICAL VALUE ALERT - BLOOD CULTURE IDENTIFICATION (BCID)  Results for orders placed or performed during the hospital encounter of Aug 20, 2017  Blood Culture ID Panel (Reflexed) (Collected: 2017-10-07  3:38 PM)  Result Value Ref Range   Enterococcus species NOT DETECTED NOT DETECTED   Listeria monocytogenes NOT DETECTED NOT DETECTED   Staphylococcus species DETECTED (A) NOT DETECTED   Staphylococcus aureus NOT DETECTED NOT DETECTED   Methicillin resistance DETECTED (A) NOT DETECTED   Streptococcus species NOT DETECTED NOT DETECTED   Streptococcus agalactiae NOT DETECTED NOT DETECTED   Streptococcus pneumoniae NOT DETECTED NOT DETECTED   Streptococcus pyogenes NOT DETECTED NOT DETECTED   Acinetobacter baumannii NOT DETECTED NOT DETECTED   Enterobacteriaceae species NOT DETECTED NOT DETECTED   Enterobacter cloacae complex NOT DETECTED NOT DETECTED   Escherichia coli NOT DETECTED NOT DETECTED   Klebsiella oxytoca NOT DETECTED NOT DETECTED   Klebsiella pneumoniae NOT DETECTED NOT DETECTED   Proteus species NOT DETECTED NOT DETECTED   Serratia marcescens NOT DETECTED NOT DETECTED   Haemophilus influenzae NOT DETECTED NOT DETECTED   Neisseria meningitidis NOT DETECTED NOT DETECTED   Pseudomonas aeruginosa NOT DETECTED NOT DETECTED   Candida albicans NOT DETECTED NOT DETECTED   Candida glabrata NOT DETECTED NOT DETECTED   Candida krusei NOT DETECTED NOT DETECTED   Candida parapsilosis NOT DETECTED NOT DETECTED   Candida tropicalis NOT DETECTED NOT DETECTED    Name of physician (or Provider) Contacted: Agapito GamesAlison Masters, PharmD on 57M  Changes to prescribed antibiotics required: Gram stain showed GPCx in clusters + GNR in anaerobe bottle of one set. BCID showed staph species w/ Mec A + and no GNRs identified. Second set of blood cultures sent. Patient currently on ceftriaxone, continue current regimen and follow up second set of blood cultures.   Carylon PerchesMaggie Grabiel Schmutz,  PharmD Acute Care Pharmacy Resident  Pager: [phone] 11/04/2016

## 2016-11-04 NOTE — CV Procedure (Signed)
2D echo attempted but EEG in room and wanted me to come back later.

## 2016-11-04 NOTE — Progress Notes (Signed)
Responded to spiritual Care consult to provide support to patient.  Staff waiting on family to make patient comfort care. At bedside are two church friends visiting.  Prayed for patient and friends.  Chaplain available as needed.   11/04/16 1400  Clinical Encounter Type  Visited With Patient and family together;Health care provider  Visit Type Initial;Spiritual support;Patient actively dying  Referral From Other (Comment) [facility]([place] consult)  Spiritual Encounters  Spiritual Needs Prayer;Emotional  Venida JarvisWatlington, Tejah Brekke, Chaplain,pager [phone]

## 2016-11-04 NOTE — Progress Notes (Addendum)
DAILY PROGRESS NOTE  Subjective:  ?Seizure activity overnight. Echo is pending. Plan for EEG today.   Objective:  Temp:  [97.4 F (36.3 C)-100.2 F (37.9 C)] 100.2 F (37.9 C) (01/18 0320) Pulse Rate:  [71-110] 80 (01/18 0800) Resp:  [13-26] 16 (01/18 0800) BP: (67-173)/(33-109) 150/69 (01/18 0800) SpO2:  [95 %-100 %] 100 % (01/18 0800) FiO2 (%):  [40 %-100 %] 40 % (01/18 0400) Weight:  [98 lb 5.2 oz (44.6 kg)-103 lb 2.8 oz (46.8 kg)] 103 lb 2.8 oz (46.8 kg) (01/18 0411) Weight change:   Intake/Output from previous day: 01/17 0701 - 01/18 0700 In: 2368.7 [I.V.:1248.7; NG/GT:60; IV Piggyback:1050] Out: 415 [Urine:290; Emesis/NG output:125]  Intake/Output from this shift: Total I/O In: 132.2 [I.V.:132.2] Out: -   Medications: No current facility-administered medications on file prior to encounter.    Current Outpatient Prescriptions on File Prior to Encounter  Medication Sig Dispense Refill  . amLODipine (NORVASC) 10 MG tablet Take 1 tablet (10 mg total) by mouth daily. 90 tablet 3  . furosemide (LASIX) 40 MG tablet Take 40-80 mg by mouth daily.     Marland Kitchen levothyroxine (SYNTHROID, LEVOTHROID) 25 MCG tablet Take 50 mcg by mouth daily before breakfast.     . lovastatin (MEVACOR) 20 MG tablet Take 20 mg by mouth daily.     . pantoprazole (PROTONIX) 40 MG tablet Take 1 tablet (40 mg total) by mouth 2 (two) times daily before a meal. (Patient taking differently: Take 40 mg by mouth 2 (two) times daily. ) 60 tablet 3  . polyethylene glycol powder (GLYCOLAX/MIRALAX) powder Take 17 g by mouth See admin instructions. Mix into 8 ounces of fluid of choice and drunk once a day    . potassium chloride (K-DUR) 10 MEQ tablet Take 10-20 mEq by mouth 2 (two) times daily. 20 mEq in the morning and 10 mEq in the evening    . vitamin E 400 UNIT capsule Take 400 Units by mouth daily.    . bumetanide (BUMEX) 0.5 MG tablet Take 0.5 mg by mouth daily.    . chlorthalidone (HYGROTON) 25 MG tablet  Take 1 tablet (25 mg total) by mouth daily. 30 tablet 1  . cholecalciferol (VITAMIN D) 1000 UNITS tablet Take 1,000 Units by mouth daily.    Marland Kitchen lisinopril (PRINIVIL,ZESTRIL) 5 MG tablet Take 5 mg by mouth daily.    . Multiple Vitamin (MULTIVITAMIN) tablet Take 1 tablet by mouth daily.    . ondansetron (ZOFRAN ODT) 4 MG disintegrating tablet 62m ODT q4 hours prn nausea/vomit (Patient taking differently: Take 4 mg by mouth every 4 (four) hours as needed for nausea or vomiting. ) 12 tablet 0  . vitamin C (ASCORBIC ACID) 500 MG tablet Take 500 mg by mouth daily.      Physical Exam: General appearance: intubated, sedated on vent Lungs: diminished breath sounds bibasilar and rales bibasilar Heart: regular rate and rhythm Extremities: extremities normal, atraumatic, no cyanosis or edema Neurologic: Mental status: intubated, sedated on vent, lip twitching noted  Lab Results: Results for orders placed or performed during the hospital encounter of 11/16/2016 (from the past 48 hour(s))  APTT     Status: None   Collection Time: 11/06/2016  3:05 PM  Result Value Ref Range   aPTT 27 24 - 36 seconds  Comprehensive metabolic panel     Status: Abnormal   Collection Time: 11/02/2016  3:05 PM  Result Value Ref Range   Sodium 145 135 - 145 mmol/L  Potassium 4.0 3.5 - 5.1 mmol/L   Chloride 112 (H) 101 - 111 mmol/L   CO2 19 (L) 22 - 32 mmol/L   Glucose, Bld 191 (H) 65 - 99 mg/dL   BUN 45 (H) 6 - 20 mg/dL   Creatinine, Ser 2.76 (H) 0.44 - 1.00 mg/dL   Calcium 9.8 8.9 - 10.3 mg/dL   Total Protein 7.1 6.5 - 8.1 g/dL   Albumin 3.9 3.5 - 5.0 g/dL   AST 149 (H) 15 - 41 U/L   ALT 16 14 - 54 U/L   Alkaline Phosphatase 52 38 - 126 U/L   Total Bilirubin 0.8 0.3 - 1.2 mg/dL   GFR calc non Af Amer 14 (L) >60 mL/min   GFR calc Af Amer 16 (L) >60 mL/min    Comment: (NOTE) The eGFR has been calculated using the CKD EPI equation. This calculation has not been validated in all clinical situations. eGFR's persistently  <60 mL/min signify possible Chronic Kidney Disease.    Anion gap 14 5 - 15  Magnesium     Status: None   Collection Time: 10/19/2016  3:05 PM  Result Value Ref Range   Magnesium 1.9 1.7 - 2.4 mg/dL  Protime-INR     Status: None   Collection Time: 10/25/2016  3:05 PM  Result Value Ref Range   Prothrombin Time 13.7 11.4 - 15.2 seconds   INR 1.05   Troponin I  (0, 3, 6)     Status: Abnormal   Collection Time: 11/11/2016  3:05 PM  Result Value Ref Range   Troponin I >65.00 (HH) <0.03 ng/mL    Comment: CRITICAL RESULT CALLED TO, READ BACK BY AND VERIFIED WITH: S.[facility] 11/17/2016 @ 1620 BY N.LIVINGSTON   Triglycerides     Status: None   Collection Time: 11/07/2016  3:05 PM  Result Value Ref Range   Triglycerides 136 <150 mg/dL  I-Stat Chem 8, ED  (not at [facility], [facility])     Status: Abnormal   Collection Time: 11/01/2016  3:36 PM  Result Value Ref Range   Sodium 145 135 - 145 mmol/L   Potassium 4.0 3.5 - 5.1 mmol/L   Chloride 114 (H) 101 - 111 mmol/L   BUN 48 (H) 6 - 20 mg/dL   Creatinine, Ser 2.70 (H) 0.44 - 1.00 mg/dL   Glucose, Bld 187 (H) 65 - 99 mg/dL   Calcium, Ion 1.22 1.15 - 1.40 mmol/L   TCO2 23 0 - 100 mmol/L   Hemoglobin 11.9 (L) 12.0 - 15.0 g/dL   HCT 35.0 (L) 36.0 - 46.0 %  I-Stat CG4 Lactic Acid, ED     Status: Abnormal   Collection Time: 11/11/2016  3:36 PM  Result Value Ref Range   Lactic Acid, Venous 3.23 (HH) 0.5 - 1.9 mmol/L   Comment NOTIFIED PHYSICIAN   Urinalysis, Routine w reflex microscopic     Status: Abnormal   Collection Time: 10/25/2016  3:42 PM  Result Value Ref Range   Color, Urine YELLOW YELLOW   APPearance CLEAR CLEAR   Specific Gravity, Urine 1.013 1.005 - 1.030   pH 5.0 5.0 - 8.0   Glucose, UA NEGATIVE NEGATIVE mg/dL   Hgb urine dipstick MODERATE (A) NEGATIVE   Bilirubin Urine NEGATIVE NEGATIVE   Ketones, ur NEGATIVE NEGATIVE mg/dL   Protein, ur 100 (A) NEGATIVE mg/dL   Nitrite NEGATIVE NEGATIVE   Leukocytes, UA NEGATIVE NEGATIVE   RBC / HPF 0-5 0 - 5  RBC/hpf   WBC, UA 0-5 0 -  5 WBC/hpf   Bacteria, UA RARE (A) NONE SEEN   Squamous Epithelial / LPF 0-5 (A) NONE SEEN   Mucous PRESENT   MRSA PCR Screening     Status: None   Collection Time: 11/10/2016  5:18 PM  Result Value Ref Range   MRSA by PCR NEGATIVE NEGATIVE    Comment:        The GeneXpert MRSA Assay (FDA approved for NASAL specimens only), is one component of a comprehensive MRSA colonization surveillance program. It is not intended to diagnose MRSA infection nor to guide or monitor treatment for MRSA infections.   Glucose, capillary     Status: Abnormal   Collection Time: 10/22/2016  5:19 PM  Result Value Ref Range   Glucose-Capillary 143 (H) 65 - 99 mg/dL   Comment 1 Notify RN    Comment 2 Document in Chart   Troponin I  (0, 3, 6)     Status: Abnormal   Collection Time: 10/20/2016  5:56 PM  Result Value Ref Range   Troponin I >65.00 (HH) <0.03 ng/mL    Comment: CRITICAL VALUE NOTED.  VALUE IS CONSISTENT WITH PREVIOUSLY REPORTED AND CALLED VALUE. CALL M.EL,RN 11/04/2016 _0  BY V.WILKINS CORRECTED ON 01/17 AT 2012: PREVIOUSLY REPORTED AS 72.98 CRITICAL VALUE NOTED.  VALUE IS CONSISTENT WITH PREVIOUSLY REPORTED AND CALLED VALUE.   CBC     Status: Abnormal   Collection Time: 11/07/2016  5:56 PM  Result Value Ref Range   WBC 11.7 (H) 4.0 - 10.5 K/uL   RBC 3.75 (L) 3.87 - 5.11 MIL/uL   Hemoglobin 11.7 (L) 12.0 - 15.0 g/dL   HCT 35.8 (L) 36.0 - 46.0 %   MCV 95.5 78.0 - 100.0 fL   MCH 31.2 26.0 - 34.0 pg   MCHC 32.7 30.0 - 36.0 g/dL   RDW 13.5 11.5 - 15.5 %   Platelets 222 150 - 400 K/uL  Creatinine, serum     Status: Abnormal   Collection Time: 11/07/2016  5:56 PM  Result Value Ref Range   Creatinine, Ser 2.22 (H) 0.44 - 1.00 mg/dL   GFR calc non Af Amer 18 (L) >60 mL/min   GFR calc Af Amer 21 (L) >60 mL/min    Comment: (NOTE) The eGFR has been calculated using the CKD EPI equation. This calculation has not been validated in all clinical situations. eGFR's  persistently <60 mL/min signify possible Chronic Kidney Disease.   I-STAT 3, arterial blood gas (G3+)     Status: Abnormal   Collection Time: 10/18/2016  5:57 PM  Result Value Ref Range   pH, Arterial 7.482 (H) 7.350 - 7.450   pCO2 arterial 25.7 (L) 32.0 - 48.0 mmHg   pO2, Arterial 488.0 (H) 83.0 - 108.0 mmHg   Bicarbonate 19.2 (L) 20.0 - 28.0 mmol/L   TCO2 20 0 - 100 mmol/L   O2 Saturation 100.0 %   Acid-base deficit 3.0 (H) 0.0 - 2.0 mmol/L   Patient temperature HIDE    Sample type ARTERIAL   Glucose, capillary     Status: Abnormal   Collection Time: 10/25/2016  8:21 PM  Result Value Ref Range   Glucose-Capillary 134 (H) 65 - 99 mg/dL   Comment 1 Notify RN    Comment 2 Document in Chart   Troponin I  (0, 3, 6)     Status: Abnormal   Collection Time: 11/15/2016  9:03 PM  Result Value Ref Range   Troponin I 61.90 (HH) <0.03 ng/mL    Comment:  CRITICAL VALUE NOTED.  VALUE IS CONSISTENT WITH PREVIOUSLY REPORTED AND CALLED VALUE.  Triglycerides     Status: None   Collection Time: 10/27/2016  9:03 PM  Result Value Ref Range   Triglycerides 76 <150 mg/dL  Glucose, capillary     Status: None   Collection Time: 10/24/2016 11:09 PM  Result Value Ref Range   Glucose-Capillary 91 65 - 99 mg/dL   Comment 1 Notify RN    Comment 2 Document in Chart   Influenza panel by PCR (type A & B)     Status: None   Collection Time: 10/23/2016 11:42 PM  Result Value Ref Range   Influenza A By PCR NEGATIVE NEGATIVE   Influenza B By PCR NEGATIVE NEGATIVE    Comment: (NOTE) The Xpert Xpress Flu assay is intended as an aid in the diagnosis of  influenza and should not be used as a sole basis for treatment.  This  assay is FDA approved for nasopharyngeal swab specimens only. Nasal  washings and aspirates are unacceptable for Xpert Xpress Flu testing.   Heparin level (unfractionated)     Status: Abnormal   Collection Time: 11/04/16  2:09 AM  Result Value Ref Range   Heparin Unfractionated 0.91 (H) 0.30 - 0.70  IU/mL    Comment:        IF HEPARIN RESULTS ARE BELOW EXPECTED VALUES, AND PATIENT DOSAGE HAS BEEN CONFIRMED, SUGGEST FOLLOW UP TESTING OF ANTITHROMBIN III LEVELS.   CBC     Status: Abnormal   Collection Time: 11/04/16  2:09 AM  Result Value Ref Range   WBC 14.4 (H) 4.0 - 10.5 K/uL   RBC 3.24 (L) 3.87 - 5.11 MIL/uL   Hemoglobin 10.1 (L) 12.0 - 15.0 g/dL   HCT 30.5 (L) 36.0 - 46.0 %   MCV 94.1 78.0 - 100.0 fL   MCH 31.2 26.0 - 34.0 pg   MCHC 33.1 30.0 - 36.0 g/dL   RDW 13.5 11.5 - 15.5 %   Platelets 204 150 - 400 K/uL  Basic metabolic panel     Status: Abnormal   Collection Time: 11/04/16  2:09 AM  Result Value Ref Range   Sodium 148 (H) 135 - 145 mmol/L   Potassium 3.4 (L) 3.5 - 5.1 mmol/L   Chloride 117 (H) 101 - 111 mmol/L   CO2 17 (L) 22 - 32 mmol/L   Glucose, Bld 107 (H) 65 - 99 mg/dL   BUN 45 (H) 6 - 20 mg/dL   Creatinine, Ser 2.46 (H) 0.44 - 1.00 mg/dL   Calcium 8.8 (L) 8.9 - 10.3 mg/dL   GFR calc non Af Amer 16 (L) >60 mL/min   GFR calc Af Amer 19 (L) >60 mL/min    Comment: (NOTE) The eGFR has been calculated using the CKD EPI equation. This calculation has not been validated in all clinical situations. eGFR's persistently <60 mL/min signify possible Chronic Kidney Disease.    Anion gap 14 5 - 15  Magnesium     Status: None   Collection Time: 11/04/16  2:09 AM  Result Value Ref Range   Magnesium 1.7 1.7 - 2.4 mg/dL  Phosphorus     Status: None   Collection Time: 11/04/16  2:09 AM  Result Value Ref Range   Phosphorus 2.9 2.5 - 4.6 mg/dL  Glucose, capillary     Status: Abnormal   Collection Time: 11/04/16  3:22 AM  Result Value Ref Range   Glucose-Capillary 107 (H) 65 - 99 mg/dL  Comment 1 Notify RN    Comment 2 Document in Chart   Blood gas, arterial     Status: Abnormal   Collection Time: 11/04/16  3:41 AM  Result Value Ref Range   FIO2 40.00    Delivery systems VENTILATOR    Mode PRESSURE REGULATED VOLUME CONTROL    VT 440 mL   LHR 18 resp/min    Peep/cpap 5.0 cm H20   pH, Arterial 7.514 (H) 7.350 - 7.450   pCO2 arterial 19.3 (LL) 32.0 - 48.0 mmHg    Comment: CRITICAL RESULT CALLED TO, READ BACK BY AND VERIFIED WITH: KIM CLIFTON RRT AT 0400 BY Streeter RRT ON 11/04/2016    pO2, Arterial 183 (H) 83.0 - 108.0 mmHg   Bicarbonate 15.4 (L) 20.0 - 28.0 mmol/L   Acid-base deficit 7.1 (H) 0.0 - 2.0 mmol/L   O2 Saturation 97.5 %   Patient temperature 98.6    Collection site LEFT RADIAL    Drawn by [id number]    Sample type ARTERIAL DRAW    Allens test (pass/fail) PASS PASS  I-STAT 3, arterial blood gas (G3+)     Status: Abnormal   Collection Time: 11/04/16  6:40 AM  Result Value Ref Range   pH, Arterial 7.420 7.350 - 7.450   pCO2 arterial 24.5 (L) 32.0 - 48.0 mmHg   pO2, Arterial 197.0 (H) 83.0 - 108.0 mmHg   Bicarbonate 15.8 (L) 20.0 - 28.0 mmol/L   TCO2 16 0 - 100 mmol/L   O2 Saturation 100.0 %   Acid-base deficit 7.0 (H) 0.0 - 2.0 mmol/L   Patient temperature 100.1 F    Collection site RADIAL, ALLEN'S TEST ACCEPTABLE    Drawn by RT    Sample type ARTERIAL   Glucose, capillary     Status: None   Collection Time: 11/04/16  8:05 AM  Result Value Ref Range   Glucose-Capillary 93 65 - 99 mg/dL    Imaging: Ct Head Wo Contrast  Result Date: 10/21/2016 CLINICAL DATA:  Patient on ventilator.  Unresponsive. EXAM: CT HEAD WITHOUT CONTRAST TECHNIQUE: Contiguous axial images were obtained from the base of the skull through the vertex without intravenous contrast. COMPARISON:  11/24/2011. FINDINGS: Brain: No evidence for acute infarction, hemorrhage, mass lesion, hydrocephalus, or extra-axial fluid. Global atrophy. Hypoattenuation of white matter representing chronic microvascular ischemic change. Vascular: Advanced vascular calcification in the carotid siphons. Skull: No fracture or worrisome osseous lesion. Sinuses/Orbits: Minor layering fluid in the sphenoid, ethmoid, and maxillary sinuses. No significant sinus opacity. No acute orbital  findings. Other: Marked calcific pannus surrounds the odontoid. No definite cervicomedullary compression. IMPRESSION: Atrophy and small vessel disease, similar to prior MR. No acute intracranial findings. Minor layering fluid in the sinuses could be secondary to recumbent see. Advanced vascular calcification.  No proximal hyperdense vessel. Electronically Signed   By: Staci Righter M.D.   On: 11/01/2016 15:39   Dg Chest Port 1 View  Result Date: 11/04/2016 CLINICAL DATA:  Acute respiratory failure EXAM: PORTABLE CHEST 1 VIEW COMPARISON:  10/19/2016 FINDINGS: Endotracheal tube in good position.  NG tube in the stomach. Patchy airspace disease right mid lung has improved. Left lung is clear. No effusion. IMPRESSION: Improving infiltrate right lung. Electronically Signed   By: Franchot Gallo M.D.   On: 11/04/2016 06:52   Dg Chest Portable 1 View  Result Date: 11/04/2016 CLINICAL DATA:  Respiratory failure, endotracheal tube position EXAM: PORTABLE CHEST 1 VIEW COMPARISON:  06/04/2016 FINDINGS: The heart size and mediastinal contours are  within normal limits. Mild aortic atherosclerosis. Endotracheal tube tip is seen approximately 3.9 cm above the carina in satisfactory position. Patchy opacity in the periphery of the right mid lung is noted. No pneumothorax nor CHF. The visualized skeletal structures are unremarkable. Surgical clips project over the epigastric region. Surgical coils noted in the right upper quadrant. Mild gastric distention with air. No acute osseous appearing abnormality. IMPRESSION: 1. Endotracheal tube tip in satisfactory position at 3.9 cm above the carina. 2. Patchy airspace opacity in the right mid lung cannot exclude in pneumonic consolidation. 3. Aortic atherosclerosis. Electronically Signed   By: Ashley Royalty M.D.   On: 10/18/2016 15:26   Dg Abd Portable 1v  Result Date: 11/10/2016 CLINICAL DATA:  OG tube placement EXAM: PORTABLE ABDOMEN - 1 VIEW COMPARISON:  06/04/2016, 06/03/2016  FINDINGS: Lung bases are grossly clear. Surgical clips at the GE junction. Esophageal tube tip projects over the central abdomen, presumably over the mid stomach. Vascular coils in the right upper quadrant. Nonobstructed gas pattern IMPRESSION: Esophageal tube tip overlies the mid abdomen, presumably over the mid stomach Electronically Signed   By: Donavan Foil M.D.   On: 10/31/2016 18:39    Assessment:  1. Active Problems: 2.   Acute respiratory failure with hypoxemia (HCC) 3.   Status epilepticus ([place]) 4.   Unresponsive 5.   ST elevation myocardial infarction (STEMI) ([place]) 6.   Plan:  1. Large MI with troponin >60 - ST elevation, however, not considered a PCI candidate. GFR <20. Creatinine is improved somewhat today. Volume up over 2L since admit - suspect cardiomyopathy, echocardiogram is pending today. Will start lasix 40 mg IV BID today. Follow UOP. Continue daily aspirin and heparin. Prognosis is guarded. Consider early palliative care consultation.  Time Spent Directly with Patient:  30 minutes  CRITICAL CARE:  The patient is critically ill with multi-organ system failure and requires high complexity decision making for assessment and support, frequent evaluation and titration of therapies, application of advanced monitoring technologies and extensive interpretation of multiple databases.  Length of Stay:  LOS: 1 day   Pixie Casino, MD, [facility] Attending Cardiologist Dawn C Hilty 11/04/2016, 8:50 AM

## 2016-11-04 NOTE — Progress Notes (Signed)
CSW consult acknowledged. Patient has now transitioned to comfort care. CSW signing off. Please contact should new need(s) arise.    Eden Rho, MSW, LCSW [facility] ED/2M Clinical Social Worker [phone]  

## 2016-11-04 NOTE — Consult Note (Signed)
Reason for Consult:coma Referring Physician: Hospitalist  Sheila Murphy is an 83 y.o. female.  HPI: Patient found unresponsive by son yesterday around noon with some shaking movements.  Her last known well was around 9pm the night before.  She was brought in to the ED by EMS and found to have Mi.  Since then she was intubated and sedated.  She has continued to have twitching movements of the face and throughout body at times.  Her head CT did not show a stroke.  Her EEG did show status arising from the left hemisphere.  She is currently on Keppra.  Past Medical History:  Diagnosis Date  . Aortic valve sclerosis 2004   Echo showed normal LV function, some mild diastolic dysfunction. Aortic sclerosis but no stenosis.  Marland Kitchen CAD (coronary artery disease)   . Carotid artery disease without cerebral infarction [facility])    Right internal carotid: 70-90%, left 60-70%; asymptomatic --> moderately increased RICA velocities on 01/2014 dopplers.  . Dyslipidemia    On Mevacor - followed by PCP  . Glaucoma   . Hiatal hernia   . History of heart attack 1960s   No WMA on Echo  . HTN (hypertension)   . Myocardial infarction     Past Surgical History:  Procedure Laterality Date  . APPENDECTOMY    . CAROTID DOPPLER  2011; 2015   RICA 70-99% diameter reduction, LICA 95-09% diameter reduction, right RCA significantly elevated velocities;; moderately increased when compared to 2011.  . ESOPHAGOGASTRODUODENOSCOPY N/A 12/06/2014   Procedure: ESOPHAGOGASTRODUODENOSCOPY (EGD);  Surgeon: Winfield Cunas., MD;  Location: [facility] ENDOSCOPY;  Service: Endoscopy;  Laterality: N/A;  . ESOPHAGOGASTRODUODENOSCOPY N/A 12/09/2014   Procedure: ESOPHAGOGASTRODUODENOSCOPY (EGD);  Surgeon: Arta Silence, MD;  Location: [facility] ENDOSCOPY;  Service: Endoscopy;  Laterality: N/A;  . HIATAL HERNIA REPAIR    . RADIOLOGY WITH ANESTHESIA N/A 12/09/2014   Procedure: RADIOLOGY WITH ANESTHESIA;  Surgeon: Medication Radiologist, MD;  Location: [facility];  Service: Radiology;  Laterality: N/A;  . TRANSTHORACIC ECHOCARDIOGRAM  2004   LV EF normal, LA mildly dilated, arial septum is aneurysmal, mild aortic sclerosis, mild mitral annular calcification, mild MR, mild TR, RVSP 40-74mHg (mod pulm HTN)    Family History  Problem Relation Age of Onset  . Heart attack Father   . Other      Noncontributory, deferred due to age  . Hyperlipidemia Daughter   . Hypertension Daughter   . Diabetes Son   . Heart disease Son   . Hyperlipidemia Son   . Kidney disease Son   . Heart attack Son     Social History:  reports that she quit smoking about 41 years ago. She does not have any smokeless tobacco history on file. She reports that she does not drink alcohol or use drugs.  Allergies:  Allergies  Allergen Reactions  . Grapefruit Concentrate Other (See Comments)    Reaction unknown  . Penicillins Other (See Comments)    Has patient had a PCN reaction causing immediate rash, facial/tongue/throat swelling, SOB or lightheadedness with hypotension: Unk Has patient had a PCN reaction causing severe rash involving mucus membranes or skin necrosis: Unk Has patient had a PCN reaction that required hospitalization: Unk Has patient had a PCN reaction occurring within the last 10 years: No If all of the above answers are "NO", then may proceed with Cephalosporin use.     Medications: I have reviewed the patient's current medications.  Results for orders placed or performed during the  hospital encounter of 11/10/2016 (from the past 48 hour(s))  APTT     Status: None   Collection Time: 11/04/2016  3:05 PM  Result Value Ref Range   aPTT 27 24 - 36 seconds  Comprehensive metabolic panel     Status: Abnormal   Collection Time: 11/15/2016  3:05 PM  Result Value Ref Range   Sodium 145 135 - 145 mmol/L   Potassium 4.0 3.5 - 5.1 mmol/L   Chloride 112 (H) 101 - 111 mmol/L   CO2 19 (L) 22 - 32 mmol/L   Glucose, Bld 191 (H) 65 - 99 mg/dL   BUN 45 (H) 6 - 20  mg/dL   Creatinine, Ser 2.76 (H) 0.44 - 1.00 mg/dL   Calcium 9.8 8.9 - 10.3 mg/dL   Total Protein 7.1 6.5 - 8.1 g/dL   Albumin 3.9 3.5 - 5.0 g/dL   AST 149 (H) 15 - 41 U/L   ALT 16 14 - 54 U/L   Alkaline Phosphatase 52 38 - 126 U/L   Total Bilirubin 0.8 0.3 - 1.2 mg/dL   GFR calc non Af Amer 14 (L) >60 mL/min   GFR calc Af Amer 16 (L) >60 mL/min    Comment: (NOTE) The eGFR has been calculated using the CKD EPI equation. This calculation has not been validated in all clinical situations. eGFR's persistently <60 mL/min signify possible Chronic Kidney Disease.    Anion gap 14 5 - 15  Magnesium     Status: None   Collection Time: 11/10/2016  3:05 PM  Result Value Ref Range   Magnesium 1.9 1.7 - 2.4 mg/dL  Protime-INR     Status: None   Collection Time: 10/31/2016  3:05 PM  Result Value Ref Range   Prothrombin Time 13.7 11.4 - 15.2 seconds   INR 1.05   Troponin I  (0, 3, 6)     Status: Abnormal   Collection Time: 10/25/2016  3:05 PM  Result Value Ref Range   Troponin I >65.00 (HH) <0.03 ng/mL    Comment: CRITICAL RESULT CALLED TO, READ BACK BY AND VERIFIED WITH: S.[facility] 11/04/2016 @ 1620 BY N.LIVINGSTON   Triglycerides     Status: None   Collection Time: 11/04/2016  3:05 PM  Result Value Ref Range   Triglycerides 136 <150 mg/dL  I-Stat Chem 8, ED  (not at [facility])     Status: Abnormal   Collection Time: 10/31/2016  3:36 PM  Result Value Ref Range   Sodium 145 135 - 145 mmol/L   Potassium 4.0 3.5 - 5.1 mmol/L   Chloride 114 (H) 101 - 111 mmol/L   BUN 48 (H) 6 - 20 mg/dL   Creatinine, Ser 2.70 (H) 0.44 - 1.00 mg/dL   Glucose, Bld 187 (H) 65 - 99 mg/dL   Calcium, Ion 1.22 1.15 - 1.40 mmol/L   TCO2 23 0 - 100 mmol/L   Hemoglobin 11.9 (L) 12.0 - 15.0 g/dL   HCT 35.0 (L) 36.0 - 46.0 %  I-Stat CG4 Lactic Acid, ED     Status: Abnormal   Collection Time: 11/06/2016  3:36 PM  Result Value Ref Range   Lactic Acid, Venous 3.23 (HH) 0.5 - 1.9 mmol/L   Comment NOTIFIED PHYSICIAN   Blood  culture (routine x 2)     Status: None (Preliminary result)   Collection Time: 11/14/2016  3:38 PM  Result Value Ref Range   Specimen Description BLOOD RIGHT ARM    Special Requests BOTTLES DRAWN AEROBIC AND ANAEROBIC  5CC    Culture  Setup Time      GRAM POSITIVE COCCI IN CLUSTERS GRAM NEGATIVE RODS ANAEROBIC BOTTLE ONLY Organism ID to follow CRITICAL RESULT CALLED TO, READ BACK BY AND VERIFIED WITH: M. Shuda Pharm.D. 10:30 11/04/16 ([email or web address])    Culture PENDING    Report Status PENDING   Blood Culture ID Panel (Reflexed)     Status: Abnormal   Collection Time: 10/31/2016  3:38 PM  Result Value Ref Range   Enterococcus species NOT DETECTED NOT DETECTED   Listeria monocytogenes NOT DETECTED NOT DETECTED   Staphylococcus species DETECTED (A) NOT DETECTED    Comment: CRITICAL RESULT CALLED TO, READ BACK BY AND VERIFIED WITH: M. Shuda Pharm.D. 10:30 11/04/16 ([email or web address])    Staphylococcus aureus NOT DETECTED NOT DETECTED   Methicillin resistance DETECTED (A) NOT DETECTED    Comment: CRITICAL RESULT CALLED TO, READ BACK BY AND VERIFIED WITH: M. Shuda Pharm.D. 10:30 11/04/16 ([email or web address])    Streptococcus species NOT DETECTED NOT DETECTED   Streptococcus agalactiae NOT DETECTED NOT DETECTED   Streptococcus pneumoniae NOT DETECTED NOT DETECTED   Streptococcus pyogenes NOT DETECTED NOT DETECTED   Acinetobacter baumannii NOT DETECTED NOT DETECTED   Enterobacteriaceae species NOT DETECTED NOT DETECTED   Enterobacter cloacae complex NOT DETECTED NOT DETECTED   Escherichia coli NOT DETECTED NOT DETECTED   Klebsiella oxytoca NOT DETECTED NOT DETECTED   Klebsiella pneumoniae NOT DETECTED NOT DETECTED   Proteus species NOT DETECTED NOT DETECTED   Serratia marcescens NOT DETECTED NOT DETECTED   Haemophilus influenzae NOT DETECTED NOT DETECTED   Neisseria meningitidis NOT DETECTED NOT DETECTED   Pseudomonas aeruginosa NOT DETECTED NOT DETECTED   Candida albicans NOT DETECTED NOT DETECTED   Candida  glabrata NOT DETECTED NOT DETECTED   Candida krusei NOT DETECTED NOT DETECTED   Candida parapsilosis NOT DETECTED NOT DETECTED   Candida tropicalis NOT DETECTED NOT DETECTED  Urinalysis, Routine w reflex microscopic     Status: Abnormal   Collection Time: 10/18/2016  3:42 PM  Result Value Ref Range   Color, Urine YELLOW YELLOW   APPearance CLEAR CLEAR   Specific Gravity, Urine 1.013 1.005 - 1.030   pH 5.0 5.0 - 8.0   Glucose, UA NEGATIVE NEGATIVE mg/dL   Hgb urine dipstick MODERATE (A) NEGATIVE   Bilirubin Urine NEGATIVE NEGATIVE   Ketones, ur NEGATIVE NEGATIVE mg/dL   Protein, ur 100 (A) NEGATIVE mg/dL   Nitrite NEGATIVE NEGATIVE   Leukocytes, UA NEGATIVE NEGATIVE   RBC / HPF 0-5 0 - 5 RBC/hpf   WBC, UA 0-5 0 - 5 WBC/hpf   Bacteria, UA RARE (A) NONE SEEN   Squamous Epithelial / LPF 0-5 (A) NONE SEEN   Mucous PRESENT   Urine culture     Status: None   Collection Time: 11/07/2016  3:42 PM  Result Value Ref Range   Specimen Description URINE, CATHETERIZED    Special Requests NONE    Culture NO GROWTH    Report Status 11/04/2016 FINAL   MRSA PCR Screening     Status: None   Collection Time: 11/09/2016  5:18 PM  Result Value Ref Range   MRSA by PCR NEGATIVE NEGATIVE    Comment:        The GeneXpert MRSA Assay (FDA approved for NASAL specimens only), is one component of a comprehensive MRSA colonization surveillance program. It is not intended to diagnose MRSA infection nor to guide or monitor treatment for MRSA infections.   Glucose, capillary  Status: Abnormal   Collection Time: 11/01/2016  5:19 PM  Result Value Ref Range   Glucose-Capillary 143 (H) 65 - 99 mg/dL   Comment 1 Notify RN    Comment 2 Document in Chart   Troponin I  (0, 3, 6)     Status: Abnormal   Collection Time: 10/28/2016  5:56 PM  Result Value Ref Range   Troponin I >65.00 (HH) <0.03 ng/mL    Comment: CRITICAL VALUE NOTED.  VALUE IS CONSISTENT WITH PREVIOUSLY REPORTED AND CALLED VALUE. CALL M.EL,RN  10/23/2016 '@1800'$  BY V.WILKINS CORRECTED ON 01/17 AT 2012: PREVIOUSLY REPORTED AS 72.98 CRITICAL VALUE NOTED.  VALUE IS CONSISTENT WITH PREVIOUSLY REPORTED AND CALLED VALUE.   CBC     Status: Abnormal   Collection Time: 11/12/2016  5:56 PM  Result Value Ref Range   WBC 11.7 (H) 4.0 - 10.5 K/uL   RBC 3.75 (L) 3.87 - 5.11 MIL/uL   Hemoglobin 11.7 (L) 12.0 - 15.0 g/dL   HCT 35.8 (L) 36.0 - 46.0 %   MCV 95.5 78.0 - 100.0 fL   MCH 31.2 26.0 - 34.0 pg   MCHC 32.7 30.0 - 36.0 g/dL   RDW 13.5 11.5 - 15.5 %   Platelets 222 150 - 400 K/uL  Creatinine, serum     Status: Abnormal   Collection Time: 11/17/2016  5:56 PM  Result Value Ref Range   Creatinine, Ser 2.22 (H) 0.44 - 1.00 mg/dL   GFR calc non Af Amer 18 (L) >60 mL/min   GFR calc Af Amer 21 (L) >60 mL/min    Comment: (NOTE) The eGFR has been calculated using the CKD EPI equation. This calculation has not been validated in all clinical situations. eGFR's persistently <60 mL/min signify possible Chronic Kidney Disease.   I-STAT 3, arterial blood gas (G3+)     Status: Abnormal   Collection Time: 10/30/2016  5:57 PM  Result Value Ref Range   pH, Arterial 7.482 (H) 7.350 - 7.450   pCO2 arterial 25.7 (L) 32.0 - 48.0 mmHg   pO2, Arterial 488.0 (H) 83.0 - 108.0 mmHg   Bicarbonate 19.2 (L) 20.0 - 28.0 mmol/L   TCO2 20 0 - 100 mmol/L   O2 Saturation 100.0 %   Acid-base deficit 3.0 (H) 0.0 - 2.0 mmol/L   Patient temperature HIDE    Sample type ARTERIAL   Glucose, capillary     Status: Abnormal   Collection Time: 10/24/2016  8:21 PM  Result Value Ref Range   Glucose-Capillary 134 (H) 65 - 99 mg/dL   Comment 1 Notify RN    Comment 2 Document in Chart   Troponin I  (0, 3, 6)     Status: Abnormal   Collection Time: 11/08/2016  9:03 PM  Result Value Ref Range   Troponin I 61.90 (HH) <0.03 ng/mL    Comment: CRITICAL VALUE NOTED.  VALUE IS CONSISTENT WITH PREVIOUSLY REPORTED AND CALLED VALUE.  Triglycerides     Status: None   Collection Time: 11/06/2016   9:03 PM  Result Value Ref Range   Triglycerides 76 <150 mg/dL  Glucose, capillary     Status: None   Collection Time: 10/27/2016 11:09 PM  Result Value Ref Range   Glucose-Capillary 91 65 - 99 mg/dL   Comment 1 Notify RN    Comment 2 Document in Chart   Influenza panel by PCR (type A & B)     Status: None   Collection Time: 11/01/2016 11:42 PM  Result Value Ref  Range   Influenza A By PCR NEGATIVE NEGATIVE   Influenza B By PCR NEGATIVE NEGATIVE    Comment: (NOTE) The Xpert Xpress Flu assay is intended as an aid in the diagnosis of  influenza and should not be used as a sole basis for treatment.  This  assay is FDA approved for nasopharyngeal swab specimens only. Nasal  washings and aspirates are unacceptable for Xpert Xpress Flu testing.   Heparin level (unfractionated)     Status: Abnormal   Collection Time: 11/04/16  2:09 AM  Result Value Ref Range   Heparin Unfractionated 0.91 (H) 0.30 - 0.70 IU/mL    Comment:        IF HEPARIN RESULTS ARE BELOW EXPECTED VALUES, AND PATIENT DOSAGE HAS BEEN CONFIRMED, SUGGEST FOLLOW UP TESTING OF ANTITHROMBIN III LEVELS.   CBC     Status: Abnormal   Collection Time: 11/04/16  2:09 AM  Result Value Ref Range   WBC 14.4 (H) 4.0 - 10.5 K/uL   RBC 3.24 (L) 3.87 - 5.11 MIL/uL   Hemoglobin 10.1 (L) 12.0 - 15.0 g/dL   HCT 30.5 (L) 36.0 - 46.0 %   MCV 94.1 78.0 - 100.0 fL   MCH 31.2 26.0 - 34.0 pg   MCHC 33.1 30.0 - 36.0 g/dL   RDW 13.5 11.5 - 15.5 %   Platelets 204 150 - 400 K/uL  Basic metabolic panel     Status: Abnormal   Collection Time: 11/04/16  2:09 AM  Result Value Ref Range   Sodium 148 (H) 135 - 145 mmol/L   Potassium 3.4 (L) 3.5 - 5.1 mmol/L   Chloride 117 (H) 101 - 111 mmol/L   CO2 17 (L) 22 - 32 mmol/L   Glucose, Bld 107 (H) 65 - 99 mg/dL   BUN 45 (H) 6 - 20 mg/dL   Creatinine, Ser 2.46 (H) 0.44 - 1.00 mg/dL   Calcium 8.8 (L) 8.9 - 10.3 mg/dL   GFR calc non Af Amer 16 (L) >60 mL/min   GFR calc Af Amer 19 (L) >60 mL/min     Comment: (NOTE) The eGFR has been calculated using the CKD EPI equation. This calculation has not been validated in all clinical situations. eGFR's persistently <60 mL/min signify possible Chronic Kidney Disease.    Anion gap 14 5 - 15  Magnesium     Status: None   Collection Time: 11/04/16  2:09 AM  Result Value Ref Range   Magnesium 1.7 1.7 - 2.4 mg/dL  Phosphorus     Status: None   Collection Time: 11/04/16  2:09 AM  Result Value Ref Range   Phosphorus 2.9 2.5 - 4.6 mg/dL  Glucose, capillary     Status: Abnormal   Collection Time: 11/04/16  3:22 AM  Result Value Ref Range   Glucose-Capillary 107 (H) 65 - 99 mg/dL   Comment 1 Notify RN    Comment 2 Document in Chart   Blood gas, arterial     Status: Abnormal   Collection Time: 11/04/16  3:41 AM  Result Value Ref Range   FIO2 40.00    Delivery systems VENTILATOR    Mode PRESSURE REGULATED VOLUME CONTROL    VT 440 mL   LHR 18 resp/min   Peep/cpap 5.0 cm H20   pH, Arterial 7.514 (H) 7.350 - 7.450   pCO2 arterial 19.3 (LL) 32.0 - 48.0 mmHg    Comment: CRITICAL RESULT CALLED TO, READ BACK BY AND VERIFIED WITH: KIM CLIFTON RRT AT 0400 BY  MAURICE SMITH RRT ON 11/04/2016    pO2, Arterial 183 (H) 83.0 - 108.0 mmHg   Bicarbonate 15.4 (L) 20.0 - 28.0 mmol/L   Acid-base deficit 7.1 (H) 0.0 - 2.0 mmol/L   O2 Saturation 97.5 %   Patient temperature 98.6    Collection site LEFT RADIAL    Drawn by [id number]    Sample type ARTERIAL DRAW    Allens test (pass/fail) PASS PASS  I-STAT 3, arterial blood gas (G3+)     Status: Abnormal   Collection Time: 11/04/16  6:40 AM  Result Value Ref Range   pH, Arterial 7.420 7.350 - 7.450   pCO2 arterial 24.5 (L) 32.0 - 48.0 mmHg   pO2, Arterial 197.0 (H) 83.0 - 108.0 mmHg   Bicarbonate 15.8 (L) 20.0 - 28.0 mmol/L   TCO2 16 0 - 100 mmol/L   O2 Saturation 100.0 %   Acid-base deficit 7.0 (H) 0.0 - 2.0 mmol/L   Patient temperature 100.1 F    Collection site RADIAL, ALLEN'S TEST ACCEPTABLE    Drawn  by RT    Sample type ARTERIAL   Glucose, capillary     Status: None   Collection Time: 11/04/16  8:05 AM  Result Value Ref Range   Glucose-Capillary 93 65 - 99 mg/dL    Ct Head Wo Contrast  Result Date: 11/04/2016 CLINICAL DATA:  Patient on ventilator.  Unresponsive. EXAM: CT HEAD WITHOUT CONTRAST TECHNIQUE: Contiguous axial images were obtained from the base of the skull through the vertex without intravenous contrast. COMPARISON:  11/24/2011. FINDINGS: Brain: No evidence for acute infarction, hemorrhage, mass lesion, hydrocephalus, or extra-axial fluid. Global atrophy. Hypoattenuation of white matter representing chronic microvascular ischemic change. Vascular: Advanced vascular calcification in the carotid siphons. Skull: No fracture or worrisome osseous lesion. Sinuses/Orbits: Minor layering fluid in the sphenoid, ethmoid, and maxillary sinuses. No significant sinus opacity. No acute orbital findings. Other: Marked calcific pannus surrounds the odontoid. No definite cervicomedullary compression. IMPRESSION: Atrophy and small vessel disease, similar to prior MR. No acute intracranial findings. Minor layering fluid in the sinuses could be secondary to recumbent see. Advanced vascular calcification.  No proximal hyperdense vessel. Electronically Signed   By: Staci Righter M.D.   On: 11/04/2016 15:39   Dg Chest Port 1 View  Result Date: 11/04/2016 CLINICAL DATA:  Acute respiratory failure EXAM: PORTABLE CHEST 1 VIEW COMPARISON:  10/25/2016 FINDINGS: Endotracheal tube in good position.  NG tube in the stomach. Patchy airspace disease right mid lung has improved. Left lung is clear. No effusion. IMPRESSION: Improving infiltrate right lung. Electronically Signed   By: Franchot Gallo M.D.   On: 11/04/2016 06:52   Dg Chest Portable 1 View  Result Date: 10/23/2016 CLINICAL DATA:  Respiratory failure, endotracheal tube position EXAM: PORTABLE CHEST 1 VIEW COMPARISON:  06/04/2016 FINDINGS: The heart size  and mediastinal contours are within normal limits. Mild aortic atherosclerosis. Endotracheal tube tip is seen approximately 3.9 cm above the carina in satisfactory position. Patchy opacity in the periphery of the right mid lung is noted. No pneumothorax nor CHF. The visualized skeletal structures are unremarkable. Surgical clips project over the epigastric region. Surgical coils noted in the right upper quadrant. Mild gastric distention with air. No acute osseous appearing abnormality. IMPRESSION: 1. Endotracheal tube tip in satisfactory position at 3.9 cm above the carina. 2. Patchy airspace opacity in the right mid lung cannot exclude in pneumonic consolidation. 3. Aortic atherosclerosis. Electronically Signed   By: Ashley Royalty M.D.   On: 11/10/2016  15:26   Dg Abd Portable 1v  Result Date: 10/25/2016 CLINICAL DATA:  OG tube placement EXAM: PORTABLE ABDOMEN - 1 VIEW COMPARISON:  06/04/2016, 06/03/2016 FINDINGS: Lung bases are grossly clear. Surgical clips at the GE junction. Esophageal tube tip projects over the central abdomen, presumably over the mid stomach. Vascular coils in the right upper quadrant. Nonobstructed gas pattern IMPRESSION: Esophageal tube tip overlies the mid abdomen, presumably over the mid stomach Electronically Signed   By: Donavan Foil M.D.   On: 11/02/2016 18:39    ROS  Comatose - could not be obtained  Blood pressure (!) 150/69, pulse 80, temperature 100.2 F (37.9 C), temperature source Oral, resp. rate 16, height '5\' 4"'$  (1.626 m), weight 46.8 kg (103 lb 2.8 oz), SpO2 100 %. Physical Exam Intubated and sedated NEURO MS - no response to pain CN - no eye opening to pain, pupils small and not reactive; no spontaneous eye movements Motor - no movement to pain  Assessment/Plan: 1. Coma secondary to presumed hypoxic-ischemic injury in setting of MI Patient has associated status epilepticus as seen on EEG which implies a very poor prognosis.  Further imaging with MRI brain is  only necessary if requested by the family.  Agree with comfort care measures.  Will sign off on case.   Dr. Lawana Pai [facility] [phone]  11/04/2016, 11:58 AM

## 2016-11-04 NOTE — Progress Notes (Signed)
Bedside EEG completed, results pending. 

## 2016-11-04 NOTE — Progress Notes (Signed)
Wakeup assessment: Patient weaning on vent PS/CPAP at this time. Sedation continued at current rated due to twitching and involuntary arm movements. Family at bedside, awaiting MD for family discussion goals of care.

## 2016-11-05 MED ORDER — LORAZEPAM 2 MG/ML IJ SOLN
2.0000 mg | Freq: Once | INTRAMUSCULAR | Status: AC
  Administered 2016-11-05: 2 mg via INTRAVENOUS
  Filled 2016-11-05: qty 1

## 2016-11-05 MED ORDER — DEXTROSE 5 % IV SOLN
1.0000 mg/h | INTRAVENOUS | Status: DC
  Administered 2016-11-05: 2 mg/h via INTRAVENOUS
  Filled 2016-11-05: qty 25

## 2016-11-06 LAB — CULTURE, BLOOD (ROUTINE X 2)

## 2016-11-08 LAB — CULTURE, BLOOD (ROUTINE X 2): Culture: NO GROWTH

## 2016-11-12 ENCOUNTER — Telehealth: Payer: Self-pay

## 2016-11-12 NOTE — Telephone Encounter (Signed)
On 11/12/16 I received a death [facility] (original). The death certificate is for cremation. The patient is a patient of Doctor Sheila Murphy. [phone] [place] Monday [phone]([id number]) for signature.  On 11/16/16 I received the death certificate back from Doctor Sheila Murphy. I got the death certificate ready and called the funeral home to let them know the death certificate is ready for pickup. I also faxed a copy to the funeral home per the funeral home request.

## 2016-11-18 NOTE — Progress Notes (Signed)
Time of death [id number]. Family at bedside. CDS notified. Prayer held over patient body.

## 2016-11-18 NOTE — Progress Notes (Signed)
Wasted 50ml morhpine and lorazepam 40ml wasted with Marcellus ScottLesley Wilson RN.

## 2016-11-18 NOTE — Progress Notes (Signed)
Patient extubated per "withdrawal of life" protocol.  Patient tolerated well.   

## 2016-11-18 NOTE — Progress Notes (Signed)
Family all at bedside now. Patient extubated as per family wishes, Patient bolus morphine as per orders.  Family remains at bedside.

## 2016-11-18 NOTE — Progress Notes (Signed)
No wakeup assessment this morning planned extubation once [place] from [place] later today.

## 2016-11-18 NOTE — Progress Notes (Signed)
Family plans to be here together for extubation time of 1 PM today.

## 2016-11-18 NOTE — Care Management Note (Signed)
Case Management Note  Patient Details  Name: Sheila Murphy MRN: [id number] Date of Birth: 12-09-1921  Subjective/Objective:      Pt admitted with Code STEMI               Action/Plan:  Pt has been transitioned to full comfort care.  CM will continue to follow for discharge needs   Expected Discharge Date:                  Expected Discharge Plan:     In-House Referral:  Clinical Social Work  Discharge planning Services  CM Consult  Post Acute Care Choice:    Choice offered to:     DME Arranged:    DME Agency:     HH Arranged:    HH Agency:     Status of Service:  In process, will continue to follow  If discussed at Long Length of Stay Meetings, dates discussed:    Additional Comments:  Sheila Murphy, Sheila Lowden S, RN 05-16-2017, 10:45 AM

## 2016-11-18 NOTE — Progress Notes (Signed)
PULMONARY / CRITICAL CARE MEDICINE   Name: Sheila Murphy MRN: [id number] DOB: 1922-09-19    ADMISSION DATE:  11-09-16  REFERRING MD:  EDP   CHIEF COMPLAINT:  Respiratory failure   HISTORY OF PRESENT ILLNESS:   83yo female with hx CAD, HTN presented 1/17 after her son found her unresponsive with shaking movements.  EKG was concerning for STEMI, SBP 200's.  Pt intubated in ER and PCCM called for ICU admission. Initial w/u revealed AKI, lactic acidosis, troponin >65.   Deemed not candidate for cath per cardiology.    SUBJECTIVE:  No obvious seizure activity overnight. EEG indicates associated status epilepticus on EEG which indicates very poor prognosis per neuro.Pt. is on morphine gtt, full comfort care. Family to discontinue vent 1/19, when they arrive from out of town today per patient's wishes. ( Told family no sustained life support >72 hours)   VITAL SIGNS: BP (!) 134/54   Pulse (!) 52   Temp 100.2 F (37.9 C) (Oral)   Resp 14   Ht 5\' 4"  (1.626 m)   Wt 108 lb 0.4 oz (49 kg)   SpO2 100%   BMI 18.54 kg/m   HEMODYNAMICS:    VENTILATOR SETTINGS: Vent Mode: PRVC FiO2 (%):  [40 %] 40 % Set Rate:  [12 bmp] 12 bmp Vt Set:  [380 mL] 380 mL PEEP:  [5 cmH20] 5 cmH20 Plateau Pressure:  [12 cmH20-16 cmH20] 16 cmH20  INTAKE / OUTPUT: I/O last 3 completed shifts: In: 3156.9 [I.V.:1601.9; Other:130; NG/GT:60; IV Piggyback:1365] Out: 900 [Urine:775; Emesis/NG output:125]  PHYSICAL EXAMINATION: General:  Elderly female vented, sedated , in NAD  Neuro:  Unresponsive, No gag response, no response to painful stimuli HEENT:  ETT Cardiovascular:Off Monitor, pulse regular and 60, warm to touch Lungs: Vented, no assist, clear throughout, diminished per bases Abdomen:  Soft, flat, Minimal BS  Musculoskeletal: Warm dry, no edema   LABS:  BMET  Recent Labs Lab November 09, 2016 1505 11-09-2016 1536 09-Nov-2016 1756 11/04/16 0209  NA 145 145  --  148*  K 4.0 4.0  --  3.4*  CL 112* 114*   --  117*  CO2 19*  --   --  17*  BUN 45* 48*  --  45*  CREATININE 2.76* 2.70* 2.22* 2.46*  GLUCOSE 191* 187*  --  107*    Electrolytes  Recent Labs Lab 11-09-16 1505 11/04/16 0209  CALCIUM 9.8 8.8*  MG 1.9 1.7  PHOS  --  2.9    CBC  Recent Labs Lab Nov 09, 2016 1536 2016-11-09 1756 11/04/16 0209  WBC  --  11.7* 14.4*  HGB 11.9* 11.7* 10.1*  HCT 35.0* 35.8* 30.5*  PLT  --  222 204    Coag's  Recent Labs Lab 09-Nov-2016 1505  APTT 27  INR 1.05    Sepsis Markers  Recent Labs Lab Nov 09, 2016 1536  LATICACIDVEN 3.23*    ABG  Recent Labs Lab 11/09/16 1757 11/04/16 0341 11/04/16 0640  PHART 7.482* 7.514* 7.420  PCO2ART 25.7* 19.3* 24.5*  PO2ART 488.0* 183* 197.0*    Liver Enzymes  Recent Labs Lab 2016/11/09 1505  AST 149*  ALT 16  ALKPHOS 52  BILITOT 0.8  ALBUMIN 3.9    Cardiac Enzymes  Recent Labs Lab 2016/11/09 1505 2016/11/09 1756 11-09-2016 2103  TROPONINI >65.00* >65.00* 61.90*    Glucose  Recent Labs Lab November 09, 2016 1719 11/09/16 2021 2016-11-09 2309 11/04/16 0322 11/04/16 0805  GLUCAP 143* 134* 91 107* 93    Imaging No results found.  STUDIES:  CT head 1/17>>>neg acute  Echo 1/17>>>  CULTURES: Urine 1/17>>> BC x 2 1/17>>> GNR, GPC>>>  ANTIBIOTICS:   SIGNIFICANT EVENTS:   LINES/TUBES: ETT 1/17>>>  DISCUSSION: 83yo female with hx CAD, HTN presented with AMS, ?seizure, ?STEMI. Intubated in ER.  STEMI cancelled by cards, no plans for cath or intervention. EEG indicates hypoxic injury with very poor prognosis per Neuro. Per patient's wishes, family to d/c vent 1/19 after arrival from out of town.  ASSESSMENT / PLAN:  PULMONARY Acute respiratory failure - in setting AMS, ?seizure +/- STEMI ?aspiration  P:   Vent support - 8cc/kg  Continue vent support until family arrives.1/19 @ 10 am Plan for terminal extubation  per patient's pre-established wishes 1/19 once family arrive.  CARDIOVASCULAR STEMI  Hx CAD  Hx  HTN Shock  P:  Comfort Care as of 1/18  NEUROLOGIC AMS - ??seizure.  "shaking movements" and rightward gaze per family.  Head CT neg acute changes EEG indicates associated status epilepticus on EEG which indicates very poor prognosis per neuro.    P:   RASS goal: -1 Keppra Morphine GTT for comfort care    RENAL AKI - improving  P:   Full comfort care as of 1/18  GASTROINTESTINAL No active issue  P:   Full Comfort care as of 1/18   HEMATOLOGIC No active issue  P:  Full comfort care as of 1/18  INFECTIOUS ?Aspiration PNA  P:   Full Comfort Care as of 1/18     FAMILY  - Updates:  Family updated at length by Dr. Molli Knock 1/17 in ER.  Family would not want "heroic measures" and feel that she would not have wanted intubation.  Now that she is intubated, they would like to continue for a few days and see how she does.  However, no escalation of care, full DNR. Transitioned to comfort care 1/18 after neuro consult and EEG revealed associated status epilepticus as seen on EEG which implies a very poor prognosis. Per patient's pre stated wishes to her family, she would not want vent support for longer than 72 hours.She appears comfortable and in NAD on a morphine gtt with family expected this am ( 1/19) . Plan for terminal extubation once family arrive today to say their goodbyes. Spoke with son at bedside, updated.  Bevelyn Ngo, AGACNP-BC [place] [facility] 11/04/2016  9:20 AM Pager:  [phone]  STAFF NOTE: Cindi Carbon, MD FACP have personally reviewed patient's available data, including medical history, events of note, physical examination and test results as part of my evaluation. I have discussed with resident/NP and other care providers such as pharmacist, RN and RRT. In addition, I personally evaluated patient and elicited key findings of: not responsive, no active myoclonus noted on exam, appears comfortable, await final family to visit,  EEG reviewed, given findings, we need to add ativan for status /pallaitoin, continued morphine to rr 12-18 on int cpap 5 ps5 for 30 sec to titrate accordingly, esnure rate on vent is 12 for this titration, I updated family in full, when we extubate need to RA The patient is critically ill with multiple organ systems failure and requires high complexity decision making for assessment and support, frequent evaluation and titration of therapies, application of advanced monitoring technologies and extensive interpretation of multiple databases.   Critical Care Time devoted to patient care services described in this note is 30 Minutes. This time reflects time of care of this signee: Rory Percy,  MD FACP. This critical care time does not reflect procedure time, or teaching time or supervisory time of PA/NP/Med student/Med Resident etc but could involve care discussion time. Rest per NP/medical resident whose note is outlined above and that I agree with   Mcarthur Rossettianiel J. Tyson AliasFeinstein, MD, FACP Pgr: [phone] [place] [facility] 10/23/2016 11:05 AM

## 2016-11-18 NOTE — Progress Notes (Signed)
Plans for comfort care and planned extubation noted. Cardiology will sign-off.   Chrystie NoseKenneth C. Hilty, MD, [facility] Attending Cardiologist [facility]

## 2016-11-18 DEATH — deceased

## 2016-12-16 NOTE — Discharge Summary (Signed)
83yo female with hx CAD, HTN presented 1/17 after her son found her unresponsive with shaking movements.  EKG was concerning for STEMI, SBP 200's.  Pt intubated in ER and PCCM called for ICU admission. Initial w/u revealed AKI, lactic acidosis, troponin >65.   Deemed not candidate for cath per cardiology.   Hospital course:  Sever eanoxia on exam and eeg, status, treated status, St elevation MI, shock Family decided oncomfort care  Final dx death  1. Acute MI 2. Anoxic brain imjury 3. Status epilecticus 4AKI  Mcarthur RossettiDaniel J. Tyson AliasFeinstein, MD, FACP Pgr: [phone] [facility]

## 2016-12-24 IMAGING — CR DG CHEST 1V PORT
1 series · 1 of 1 positions shown · non-contrast
Comparison: None.

CLINICAL DATA: Central line inserted, check tip positioned

EXAM:
PORTABLE CHEST - 1 VIEW

[AP]
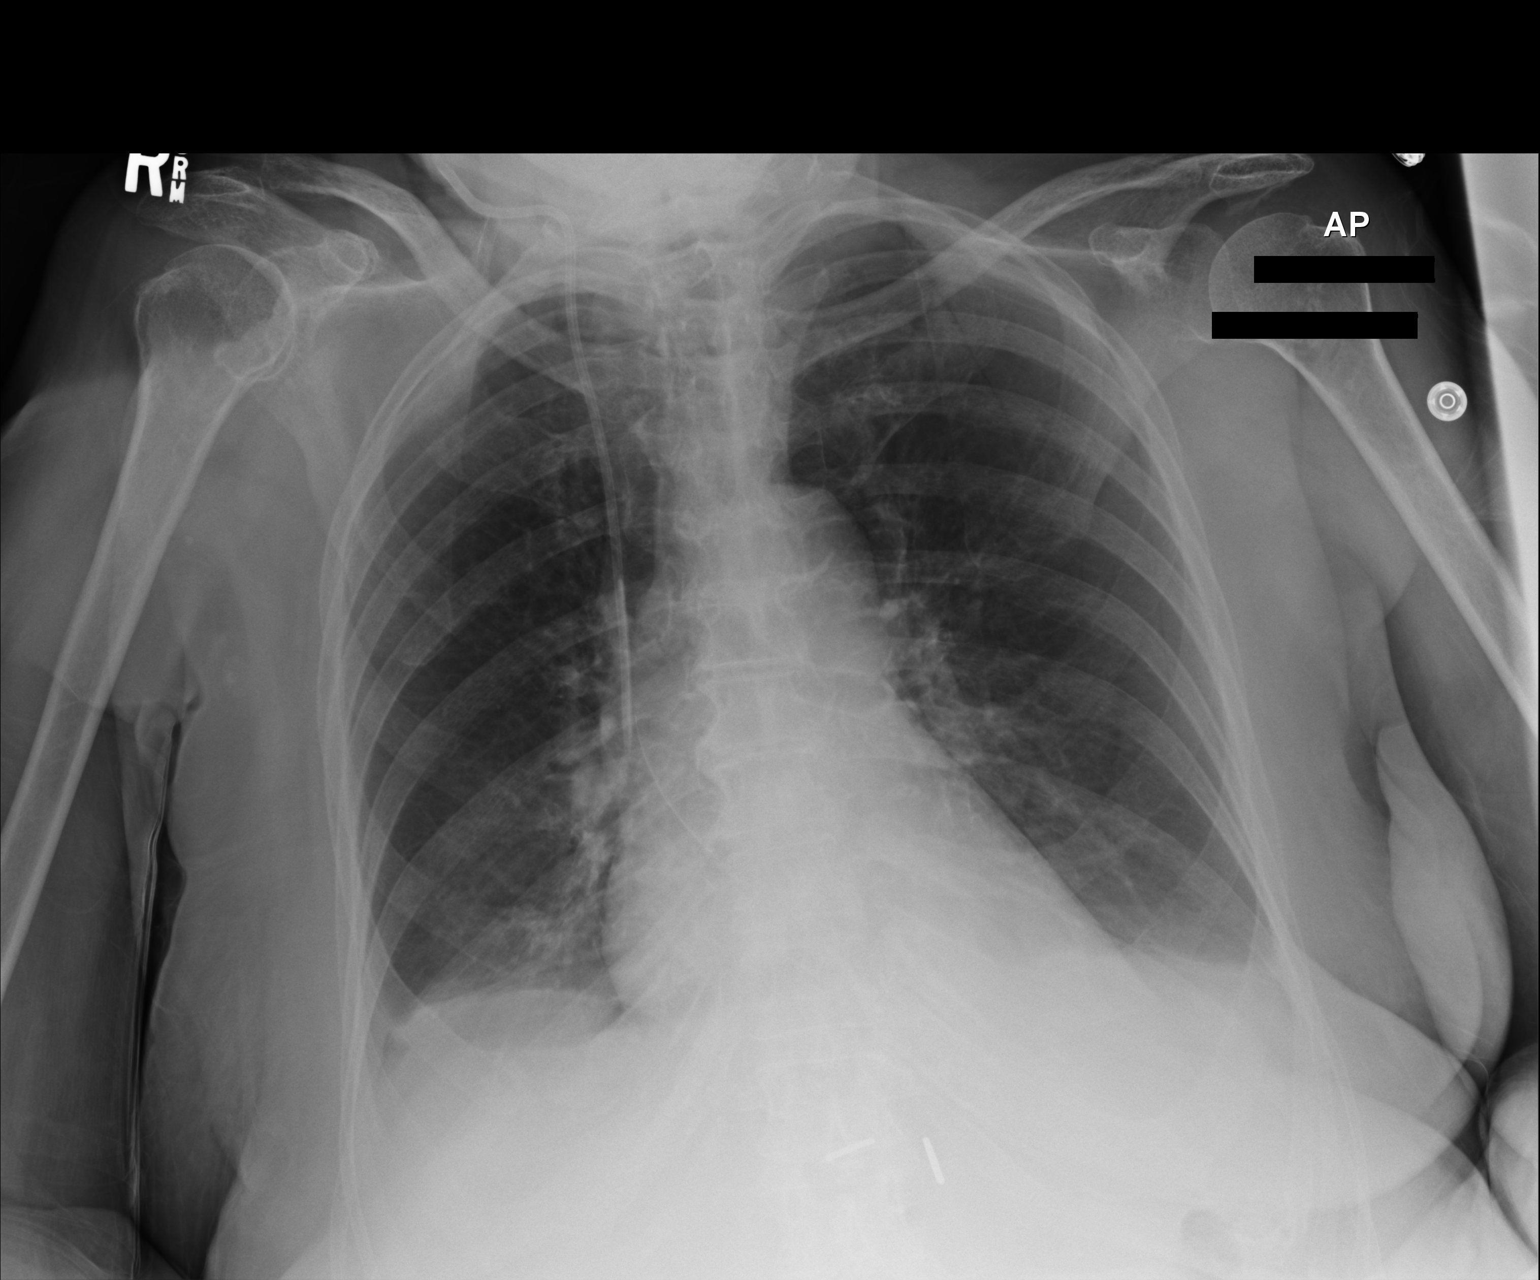

[1 of 1 positions shown; findings below may reference images not displayed]

FINDINGS: Normal cardiac silhouette. Right central venous line with tip in the
distal SVC. No pneumothorax. Small effusions noted.
IMPRESSION: Right central venous line with tip in distal SVC.

Bilateral small effusions.

## 2018-06-18 IMAGING — CT CT ABD-PELV W/O CM
2 of 4 series · 16 of 46 positions shown, 18 images · non-contrast
Comparison: None.

CLINICAL DATA: Diffuse abdominal pain for 2 weeks. History of a GI
bleed for duodenal ulcer and status post coil embolization in
feeding arteries. Question ventral hernia.

EXAM:
CT ABDOMEN AND PELVIS WITHOUT CONTRAST
TECHNIQUE: Multidetector CT imaging of the abdomen and pelvis was performed
following the standard protocol without IV contrast.

[Series 2: a/p w/o 5mm · axial · non-contrast · 0.67mm/px · z∈[-94,+260]mm · 13 of 79 slices shown, 15 images]
[im 4/79  soft-tissue]
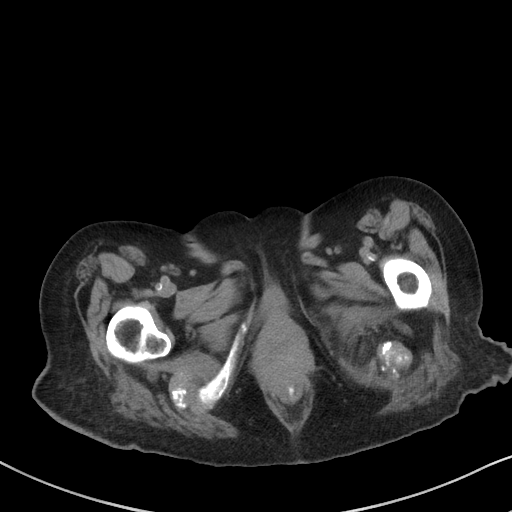
[im 4/79  bone]
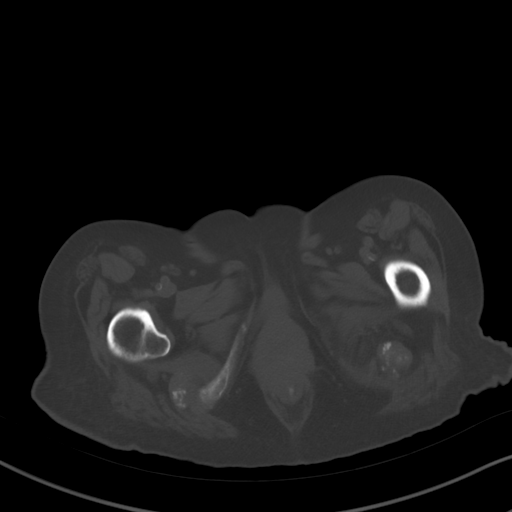
[im 10/79  soft-tissue]
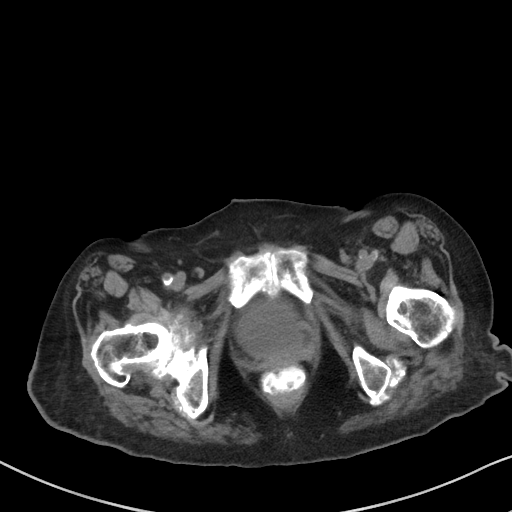
[im 16/79  soft-tissue]
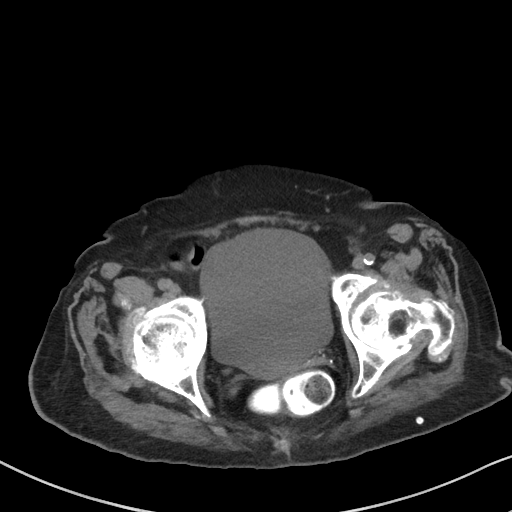
[im 22/79  soft-tissue]
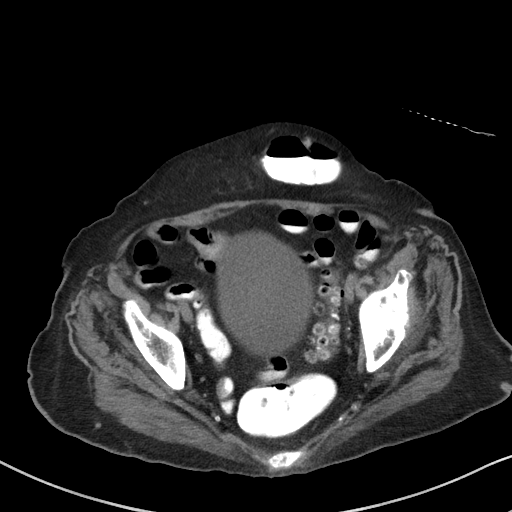
[im 29/79  soft-tissue]
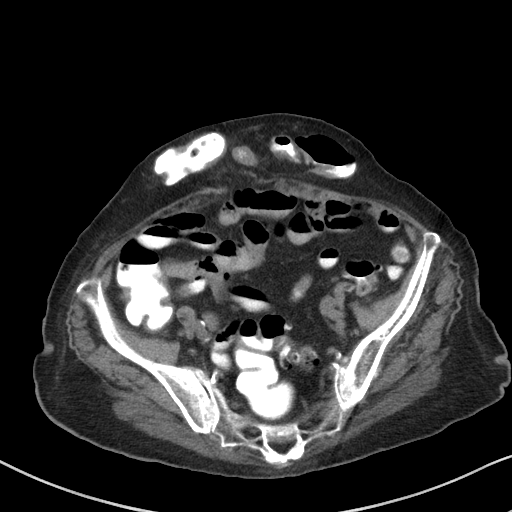
[im 35/79  soft-tissue]
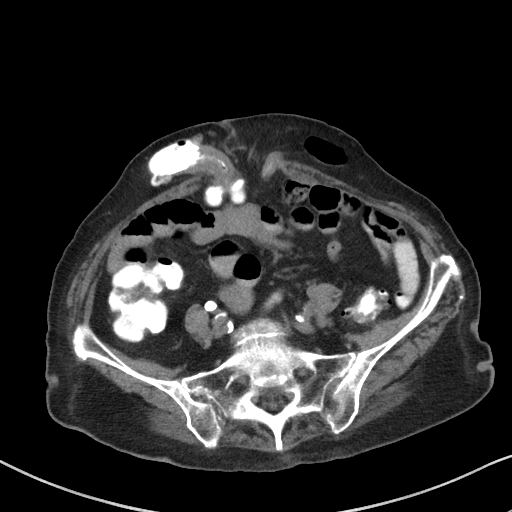
[im 41/79  soft-tissue]
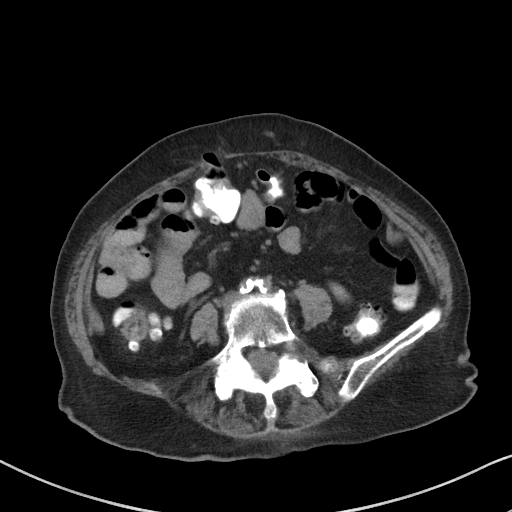
[im 44/79  soft-tissue]
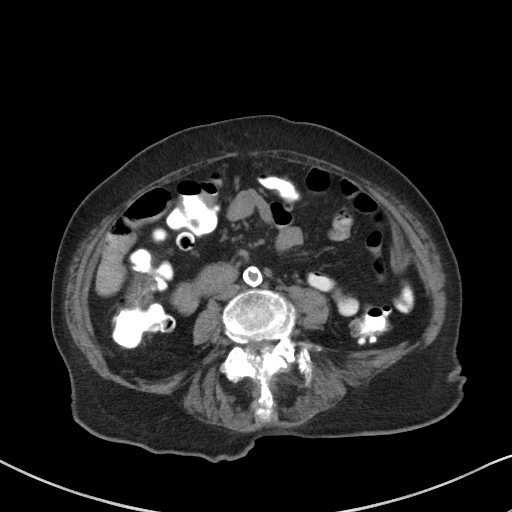
[im 50/79  soft-tissue]
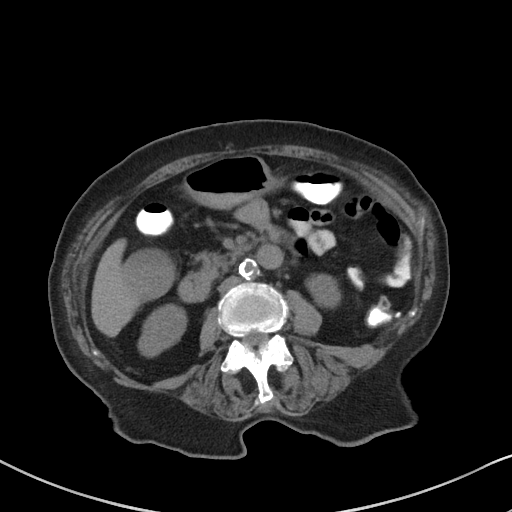
[im 50/79  bone]
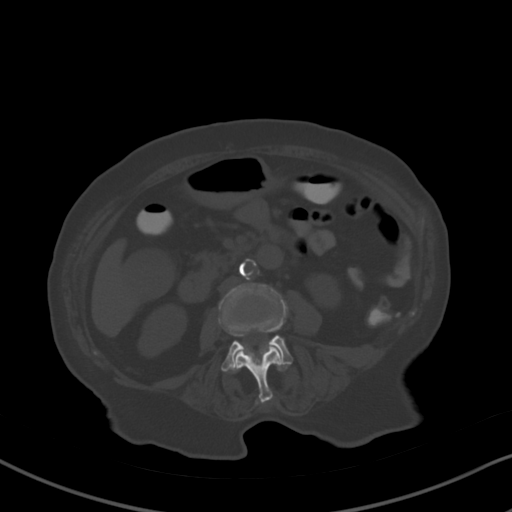
[im 57/79  soft-tissue]
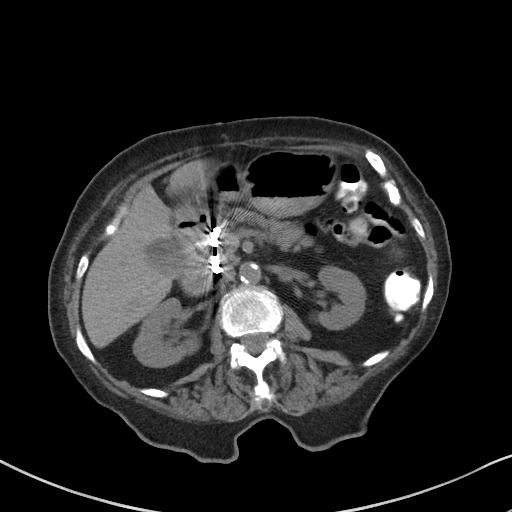
[im 63/79  soft-tissue]
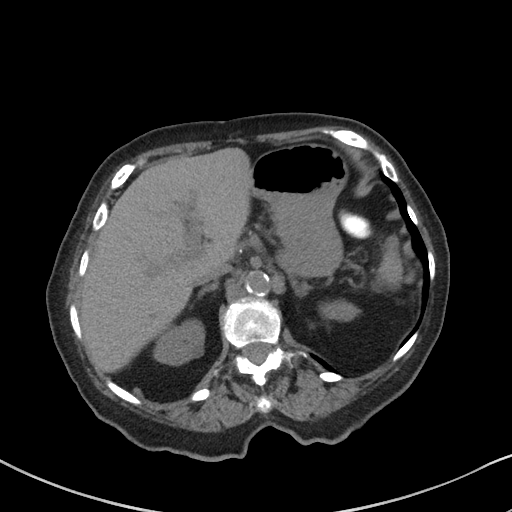
[im 69/79  soft-tissue]
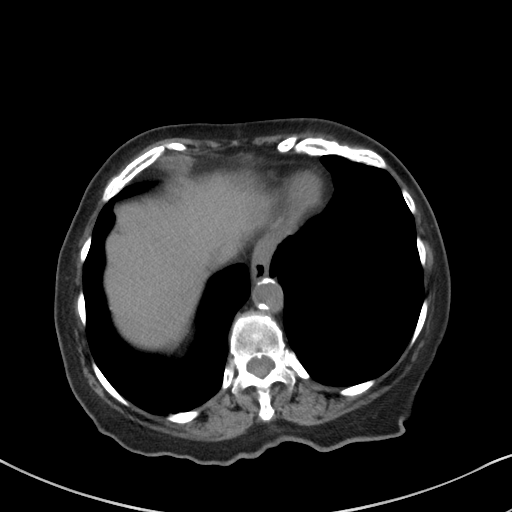
[im 75/79  soft-tissue]
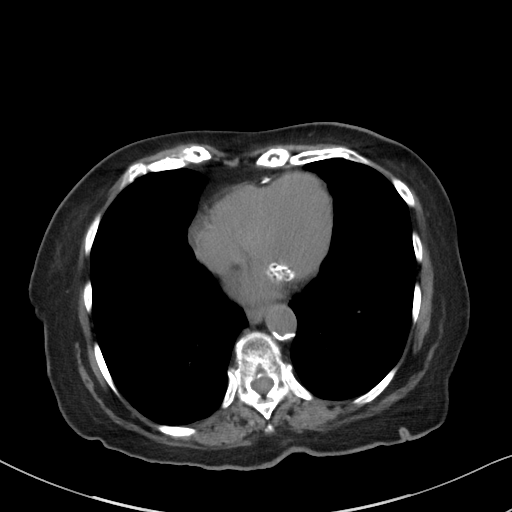

[Series 5: a/p w/o cor · coronal · non-contrast · 0.70mm/px · 3 of 118 slices shown]
[im 40/118  soft-tissue]
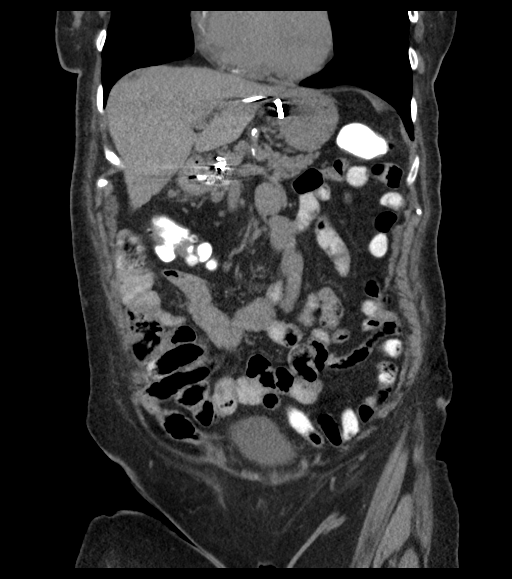
[im 53/118  soft-tissue]
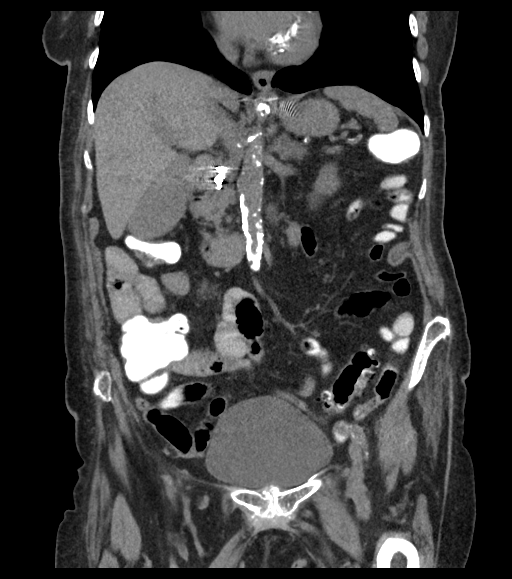
[im 66/118  soft-tissue]
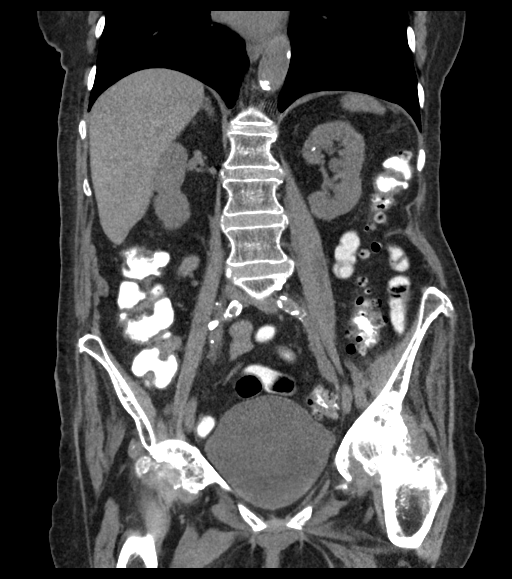

[16 of 46 positions shown; findings below may reference images not displayed]

FINDINGS: Lower chest: 5 mm nodule in the right lower lobe on sequence 3,
image 7. Punctate nodule in the left lower lobe on image 9. No
pleural effusions.

Hepatobiliary: No acute abnormality in the liver and gallbladder.

Pancreas: No acute abnormality to the pancreas. There are
embolization coils in the GDA and a pancreatic-duodenal artery.

Spleen: Normal appearance of spleen without enlargement.

Adrenals/Urinary Tract: Normal appearance of the adrenal glands.
Low-density structures in both kidneys are suggestive for cysts.
There is a 2 mm calcification in left kidney upper pole which may be
cortical. No evidence for hydronephrosis. Mild distention of the
urinary bladder.

Stomach/Bowel: Surgical clips near the GE junction. There is a
ventral hernia containing transverse colon. There is contrast
proximal and distal to the ventral hernia and no evidence to suggest
obstruction. Diffuse diverticulosis in the colon. There is oral
contrast extending all the way to the rectum.

Vascular/Lymphatic: Aorta is heavily calcified without aneurysm.
Iliac arteries are heavily calcified. No suspicious lymphadenopathy.

Reproductive: Bilateral ovarian tissue is present without gross
abnormality. Uterus has been removed.

Other: No free fluid. Multiple ventral hernias containing fat.
Largest ventral hernia contains transverse colon without
obstruction.

Musculoskeletal: Severe degenerative changes in both hips. Extensive
facet arthropathy in the lower lumbar spine.
IMPRESSION: Multiple ventral hernias. The largest ventral hernia contains
transverse colon. No evidence for obstruction or inflammatory
changes.

Mild distention of the urinary bladder.

Left renal calcification may be in the cortex.  No hydronephrosis.

**An incidental finding of potential clinical significance has been
found. Small pulmonary nodules, largest measuring 5 mm. No follow-up
needed if patient is low-risk (and has no known or suspected primary
neoplasm). Non-contrast chest CT can be considered in 12 months if
patient is high-risk. This recommendation follows the consensus
statement: Guidelines for Management of Incidental Pulmonary Nodules
Detected on CT Images:From the [HOSPITAL] 5167; published
online before print (10.1148/radiol.8417171723).**

## 2018-06-18 IMAGING — DX DG CHEST 2V
2 series · 2 of 2 positions shown · non-contrast
Comparison: 12/16/2014

CLINICAL DATA: Nausea, abdominal pain for 1 week, diarrhea

EXAM:
CHEST  2 VIEW

[w chest lat]
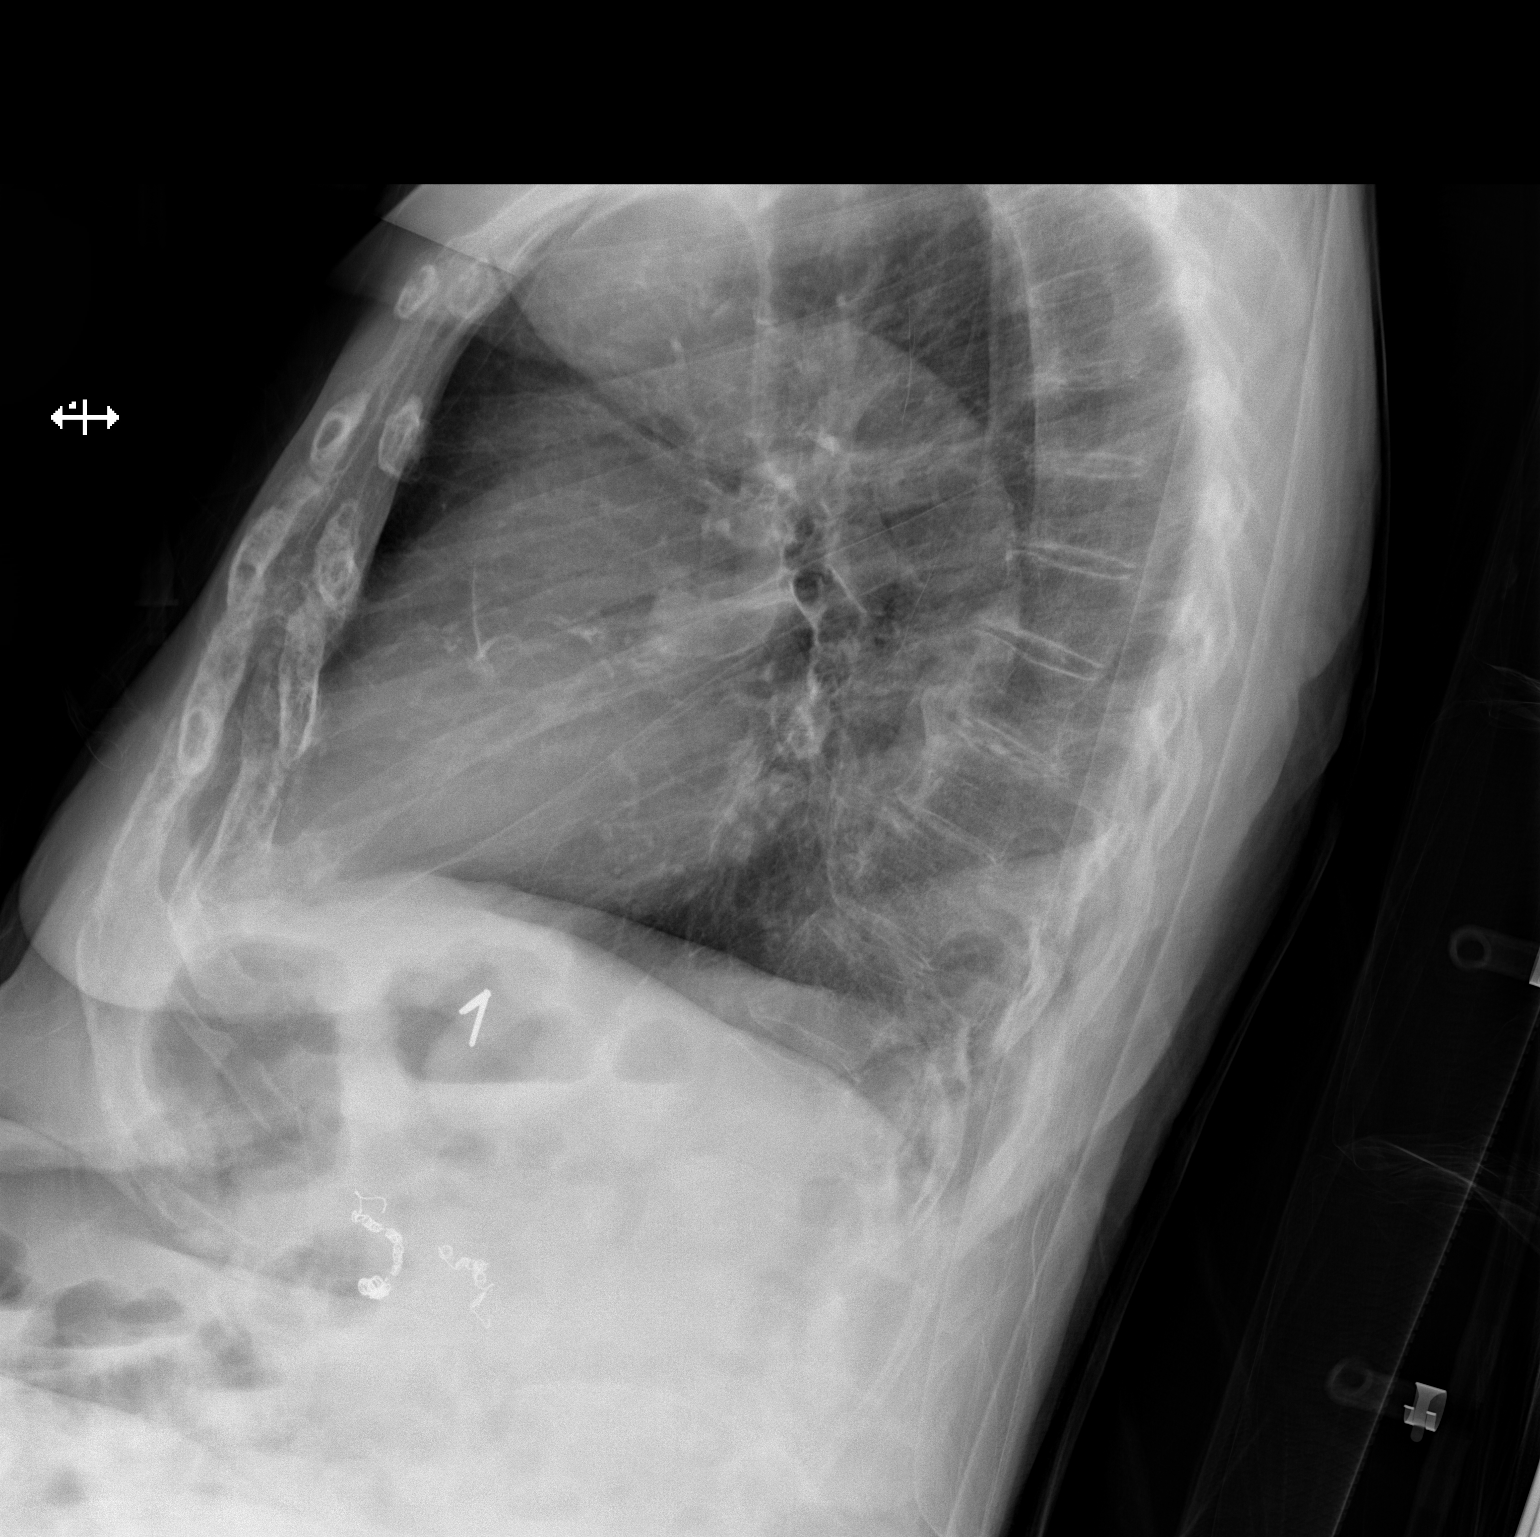

[x chest ap]
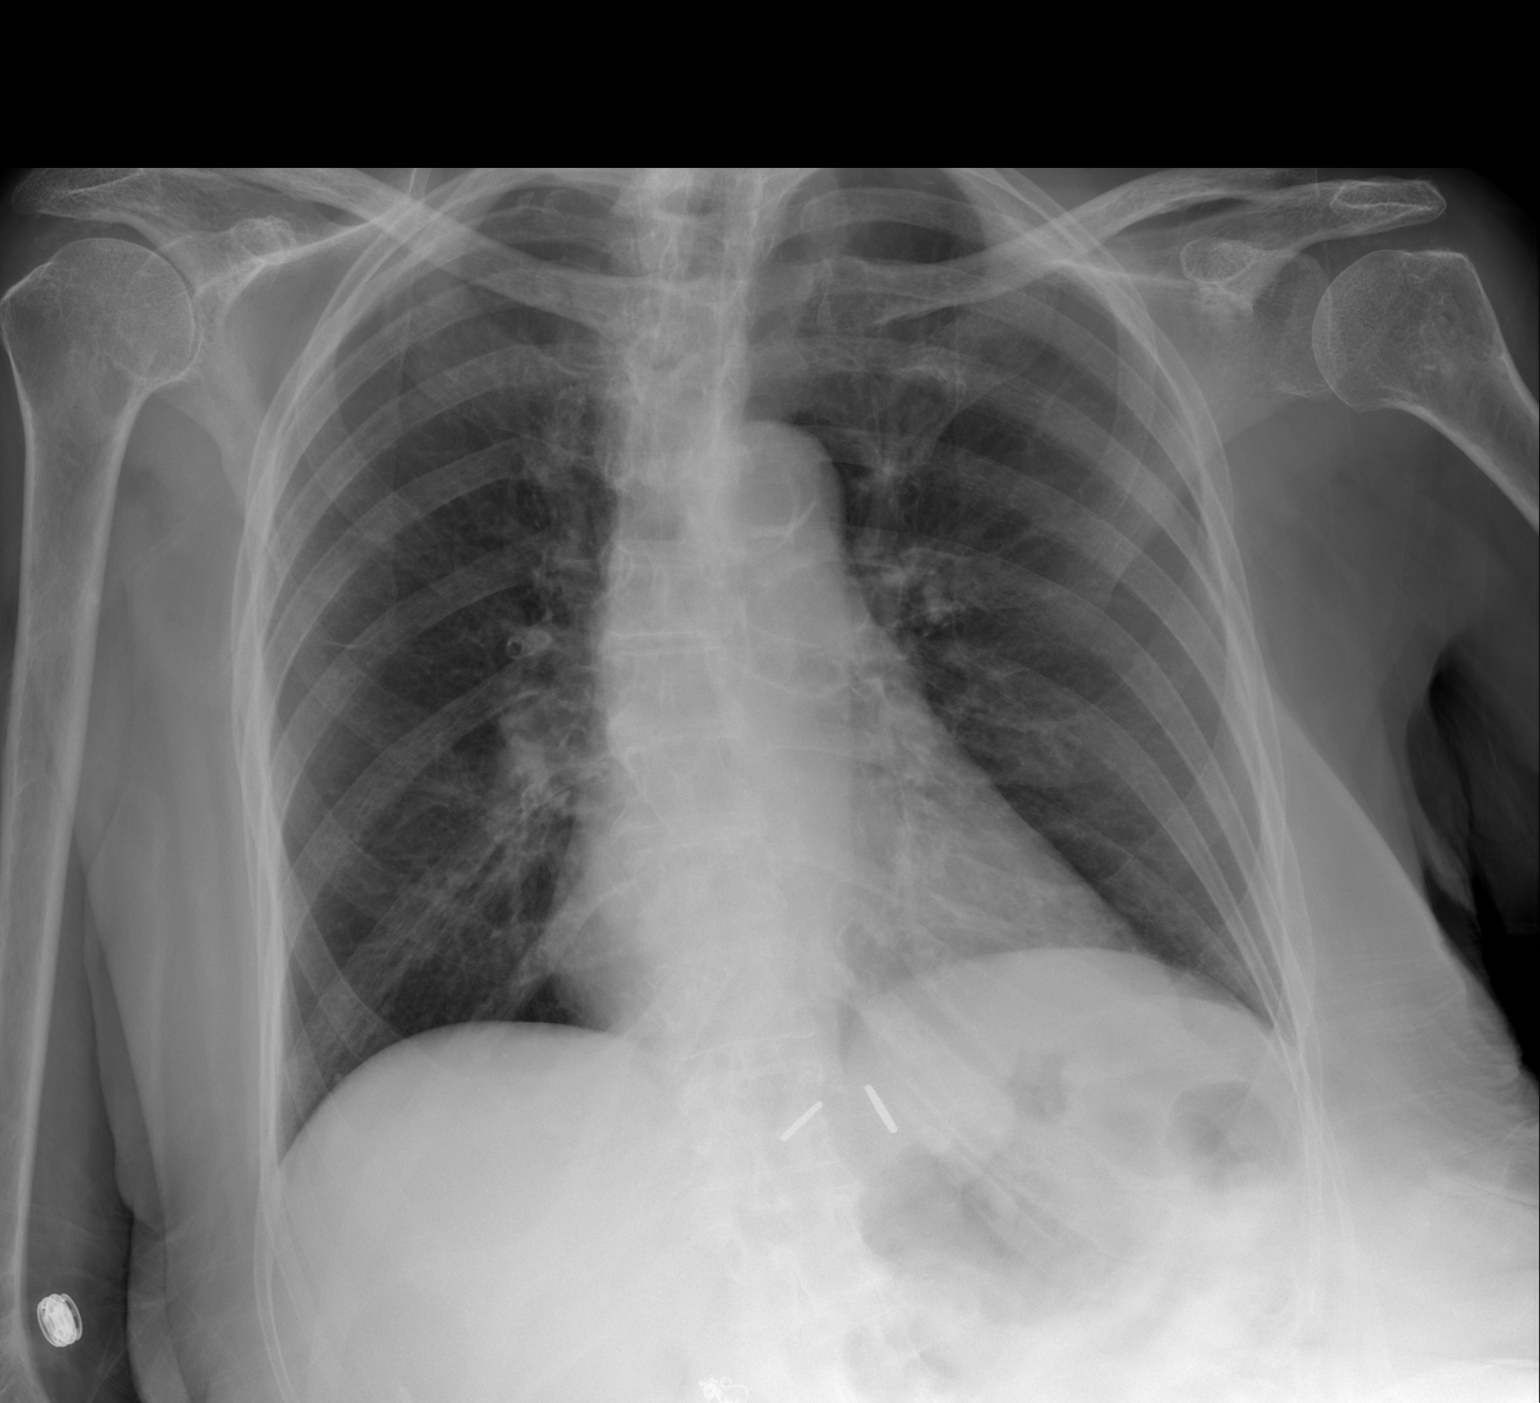

[2 of 2 positions shown; findings below may reference images not displayed]

FINDINGS: Cardiomediastinal silhouette is stable. No acute infiltrate or
pleural effusion. No pulmonary edema. Surgical clips are noted in
mid upper abdomen. Atherosclerotic calcifications of thoracic aorta.
IMPRESSION: No active cardiopulmonary disease.
